# Patient Record
Sex: Female | Born: 1975
Health system: Southern US, Community
[De-identification: ages and names within clinical notes are randomized; demographics above are authoritative.]

## PROBLEM LIST (undated history)

## (undated) DIAGNOSIS — G43909 Migraine, unspecified, not intractable, without status migrainosus: Secondary | ICD-10-CM

## (undated) DIAGNOSIS — R519 Headache, unspecified: Secondary | ICD-10-CM

## (undated) DIAGNOSIS — R011 Cardiac murmur, unspecified: Secondary | ICD-10-CM

## (undated) DIAGNOSIS — Z87440 Personal history of urinary (tract) infections: Secondary | ICD-10-CM

## (undated) DIAGNOSIS — R32 Unspecified urinary incontinence: Secondary | ICD-10-CM

## (undated) DIAGNOSIS — Z923 Personal history of irradiation: Secondary | ICD-10-CM

## (undated) DIAGNOSIS — T7840XA Allergy, unspecified, initial encounter: Secondary | ICD-10-CM

## (undated) DIAGNOSIS — R51 Headache: Secondary | ICD-10-CM

## (undated) DIAGNOSIS — F329 Major depressive disorder, single episode, unspecified: Secondary | ICD-10-CM

## (undated) DIAGNOSIS — Z8051 Family history of malignant neoplasm of kidney: Secondary | ICD-10-CM

## (undated) DIAGNOSIS — F32A Depression, unspecified: Secondary | ICD-10-CM

## (undated) DIAGNOSIS — Z800 Family history of malignant neoplasm of digestive organs: Secondary | ICD-10-CM

## (undated) DIAGNOSIS — Z803 Family history of malignant neoplasm of breast: Secondary | ICD-10-CM

## (undated) DIAGNOSIS — I10 Essential (primary) hypertension: Secondary | ICD-10-CM

## (undated) HISTORY — DX: Family history of malignant neoplasm of digestive organs: Z80.0

## (undated) HISTORY — DX: Headache, unspecified: R51.9

## (undated) HISTORY — DX: Allergy, unspecified, initial encounter: T78.40XA

## (undated) HISTORY — DX: Depression, unspecified: F32.A

## (undated) HISTORY — PX: DILATION AND CURETTAGE OF UTERUS: SHX78

## (undated) HISTORY — DX: Family history of malignant neoplasm of kidney: Z80.51

## (undated) HISTORY — DX: Cardiac murmur, unspecified: R01.1

## (undated) HISTORY — DX: Unspecified urinary incontinence: R32

## (undated) HISTORY — DX: Personal history of urinary (tract) infections: Z87.440

## (undated) HISTORY — DX: Headache: R51

## (undated) HISTORY — DX: Family history of malignant neoplasm of breast: Z80.3

## (undated) HISTORY — DX: Major depressive disorder, single episode, unspecified: F32.9

## (undated) HISTORY — DX: Migraine, unspecified, not intractable, without status migrainosus: G43.909

## (undated) HISTORY — DX: Essential (primary) hypertension: I10

---

## 1999-06-10 ENCOUNTER — Other Ambulatory Visit: Admission: RE | Admit: 1999-06-10 | Discharge: 1999-06-10 | Payer: Self-pay | Admitting: Obstetrics & Gynecology

## 1999-08-19 ENCOUNTER — Ambulatory Visit (HOSPITAL_COMMUNITY): Admission: RE | Admit: 1999-08-19 | Discharge: 1999-08-19 | Payer: Self-pay | Admitting: Obstetrics & Gynecology

## 1999-08-19 ENCOUNTER — Encounter: Payer: Self-pay | Admitting: Obstetrics & Gynecology

## 2000-11-27 ENCOUNTER — Inpatient Hospital Stay (HOSPITAL_COMMUNITY): Admission: AD | Admit: 2000-11-27 | Discharge: 2000-11-27 | Payer: Self-pay | Admitting: *Deleted

## 2001-09-05 ENCOUNTER — Other Ambulatory Visit: Admission: RE | Admit: 2001-09-05 | Discharge: 2001-09-05 | Payer: Self-pay | Admitting: Obstetrics & Gynecology

## 2002-02-16 ENCOUNTER — Emergency Department (HOSPITAL_COMMUNITY): Admission: EM | Admit: 2002-02-16 | Discharge: 2002-02-16 | Payer: Self-pay | Admitting: Emergency Medicine

## 2005-02-19 ENCOUNTER — Inpatient Hospital Stay (HOSPITAL_COMMUNITY): Admission: AD | Admit: 2005-02-19 | Discharge: 2005-02-19 | Payer: Self-pay | Admitting: Obstetrics & Gynecology

## 2005-04-11 ENCOUNTER — Inpatient Hospital Stay (HOSPITAL_COMMUNITY): Admission: AD | Admit: 2005-04-11 | Discharge: 2005-04-11 | Payer: Self-pay | Admitting: Obstetrics and Gynecology

## 2005-04-12 ENCOUNTER — Inpatient Hospital Stay (HOSPITAL_COMMUNITY): Admission: AD | Admit: 2005-04-12 | Discharge: 2005-04-14 | Payer: Self-pay | Admitting: Obstetrics and Gynecology

## 2009-04-15 ENCOUNTER — Emergency Department (HOSPITAL_COMMUNITY): Admission: EM | Admit: 2009-04-15 | Discharge: 2009-04-15 | Payer: Self-pay | Admitting: Emergency Medicine

## 2009-04-15 IMAGING — CT CT HEAD W/O CM
1 series · 16 of 30 positions shown, 20 images · non-contrast
Comparison: None

CLINICAL DATA: Dizziness.

CT HEAD WITHOUT CONTRAST
TECHNIQUE: Contiguous axial images were obtained from the base of
the skull through the vertex without contrast.

[Series 2: head_seq 4.5 h37s st · axial · 0.43mm/px · z∈[+1071,+1197]mm · 16 of 32 slices shown, 20 images]
[im 2/32  brain]
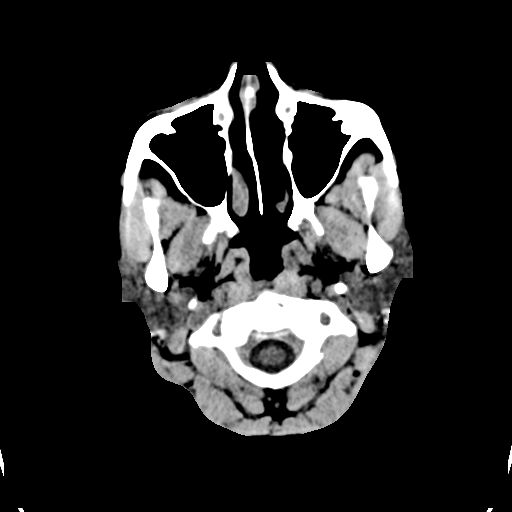
[im 2/32  bone]
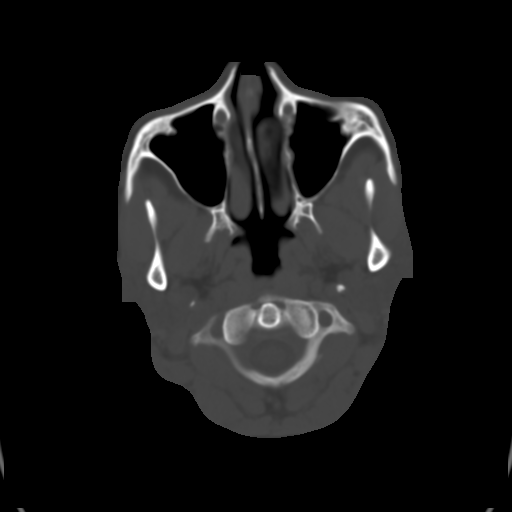
[im 4/32  brain]
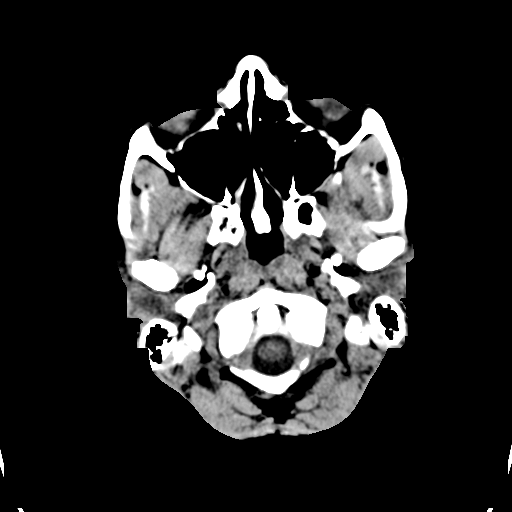
[im 6/32  brain]
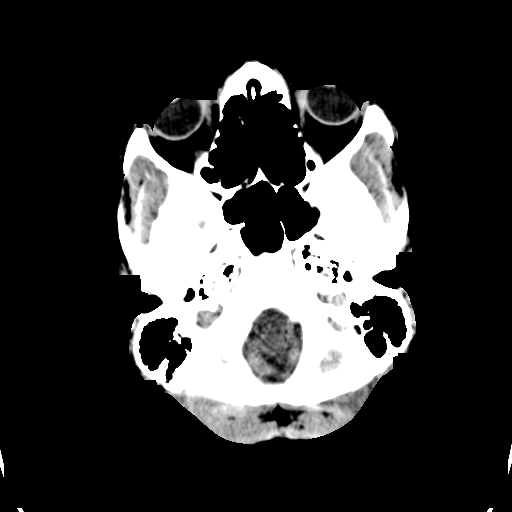
[im 8/32  brain]
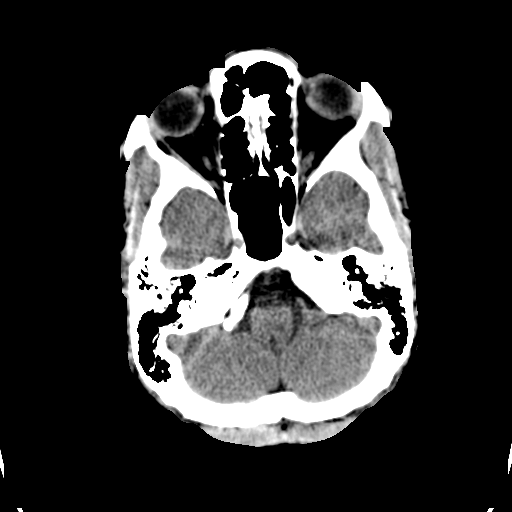
[im 9/32  brain]
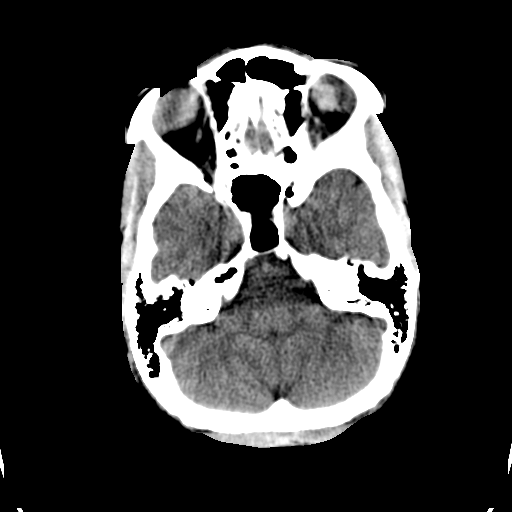
[im 9/32  bone]
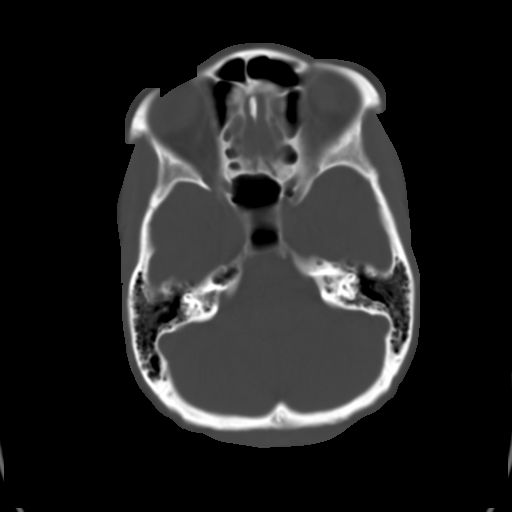
[im 11/32  brain]
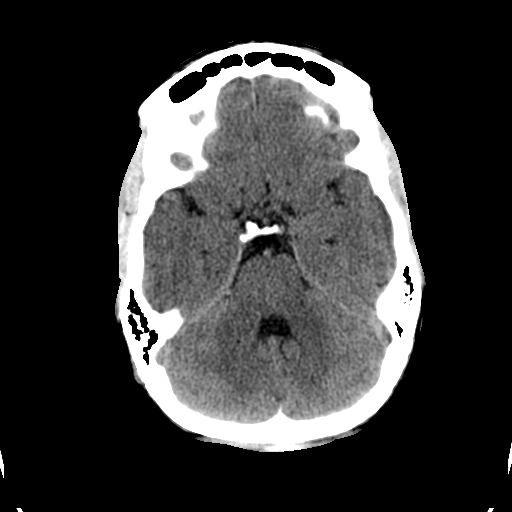
[im 13/32  brain]
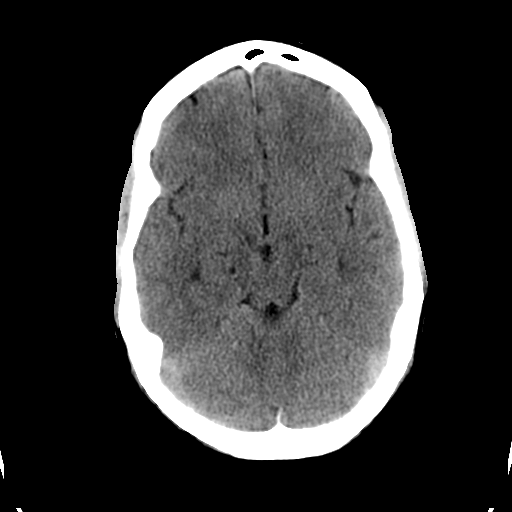
[im 15/32  brain]
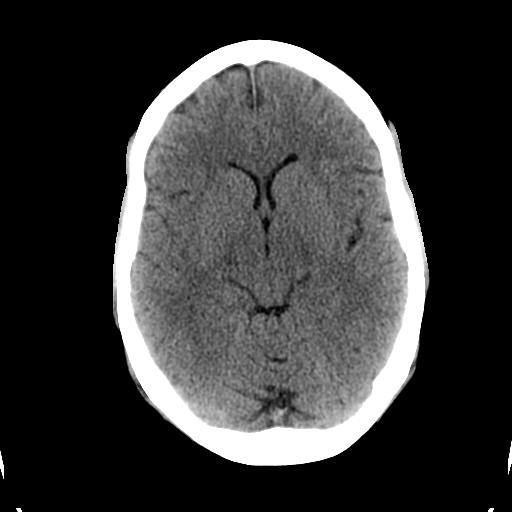
[im 17/32  brain]
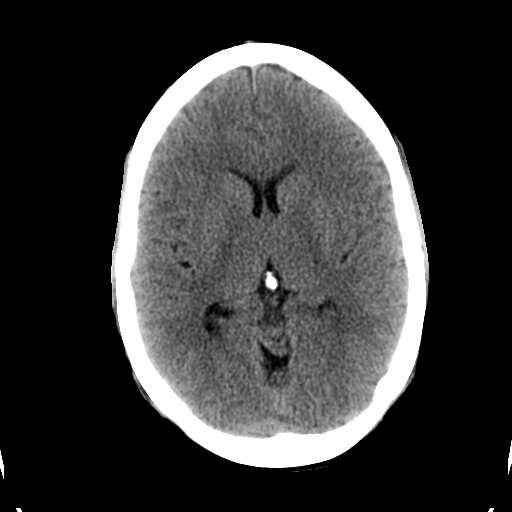
[im 17/32  bone]
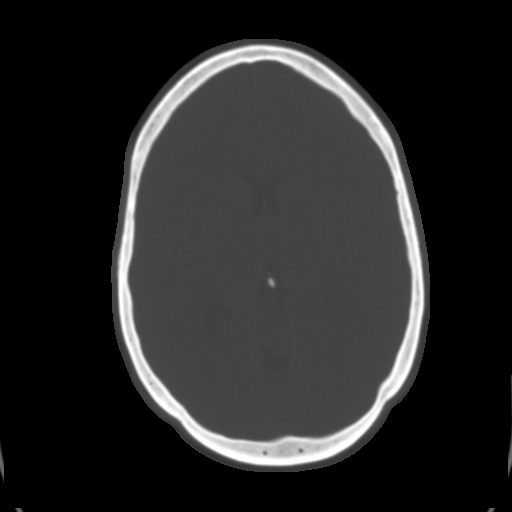
[im 19/32  brain]
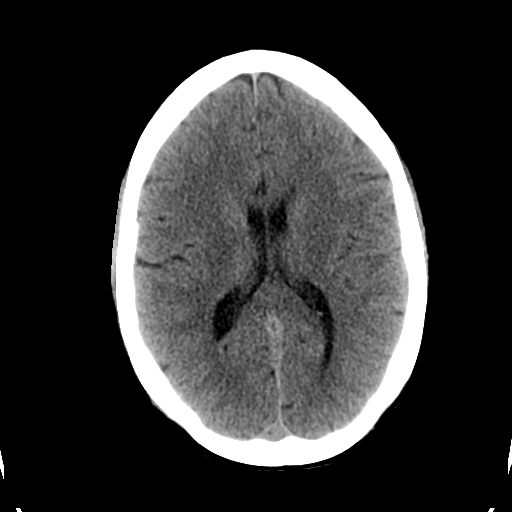
[im 21/32  brain]
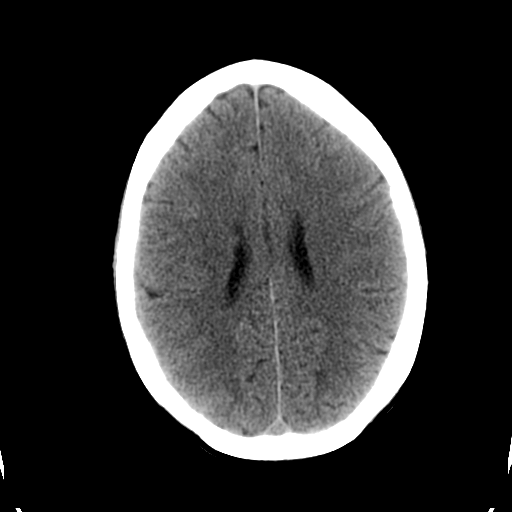
[im 23/32  brain]
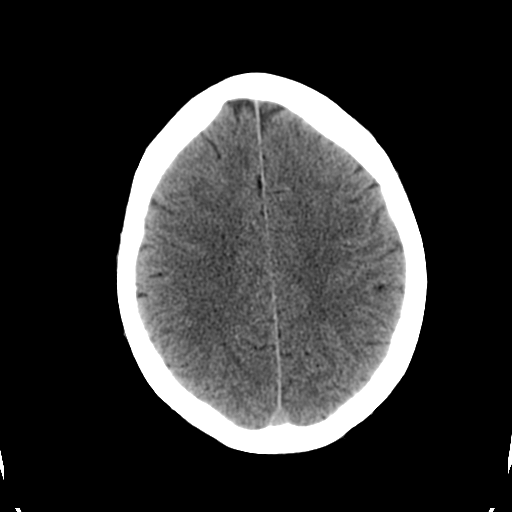
[im 24/32  brain]
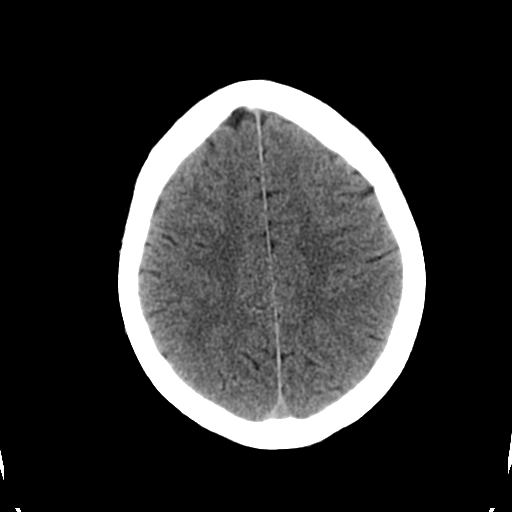
[im 24/32  bone]
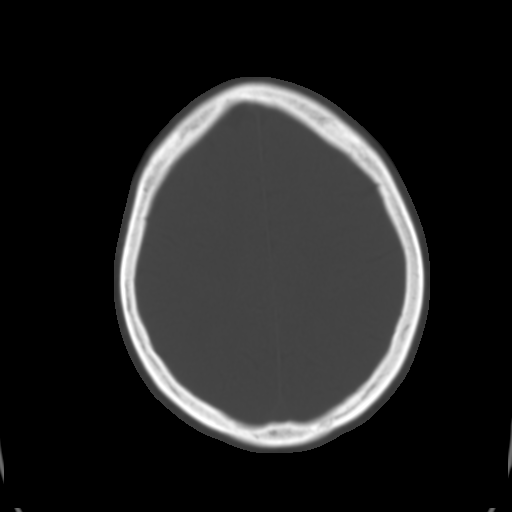
[im 26/32  brain]
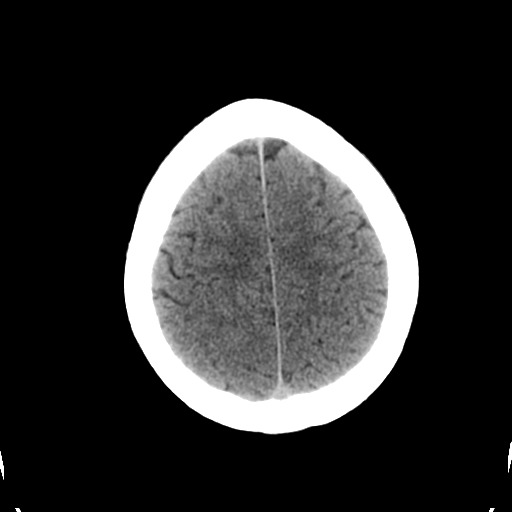
[im 28/32  brain]
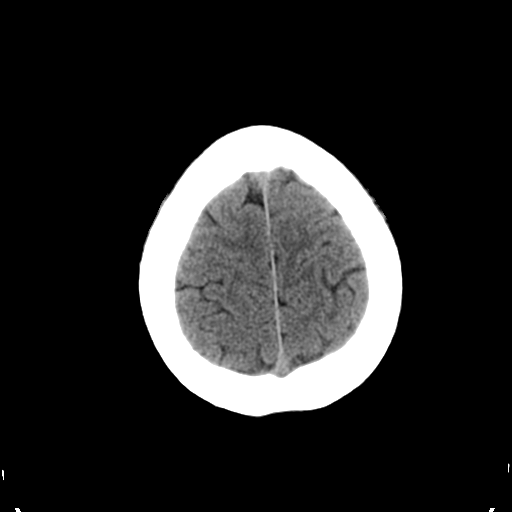
[im 30/32  brain]
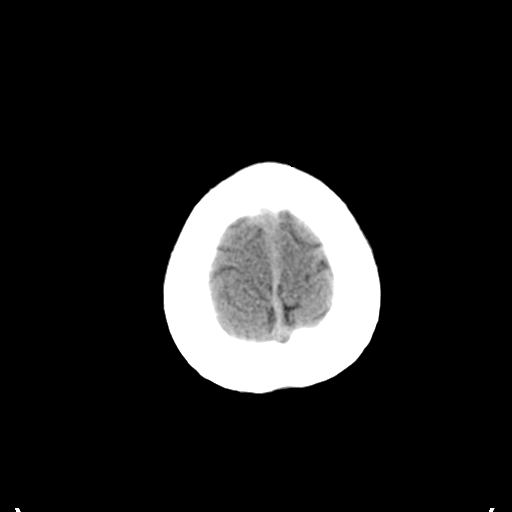

[16 of 30 positions shown; findings below may reference images not displayed]

FINDINGS: There is no acute intracranial hemorrhage, acute
infarction, or intracranial mass lesion.  The brain parenchyma is
normal.  Ventricles are normal.  Bony structures are normal.
IMPRESSION: Normal exam.

## 2010-01-23 ENCOUNTER — Ambulatory Visit (HOSPITAL_COMMUNITY): Admission: RE | Admit: 2010-01-23 | Discharge: 2010-01-23 | Payer: Self-pay | Admitting: Maternal and Fetal Medicine

## 2010-01-23 IMAGING — US US OB TRANSVAGINAL
1 series · 18 of 20 positions shown · non-contrast
Comparison: none

OBSTETRICAL ULTRASOUND:
 This ultrasound was performed in The [HOSPITAL], and the [facility] report will be stored to [REDACTED] PACS.  This report is also available in [HOSPITAL]?s accessANYware.

[Series 1: us ob transvaginal · 20 acquisitions, 18 frames shown]
[im 1/20]
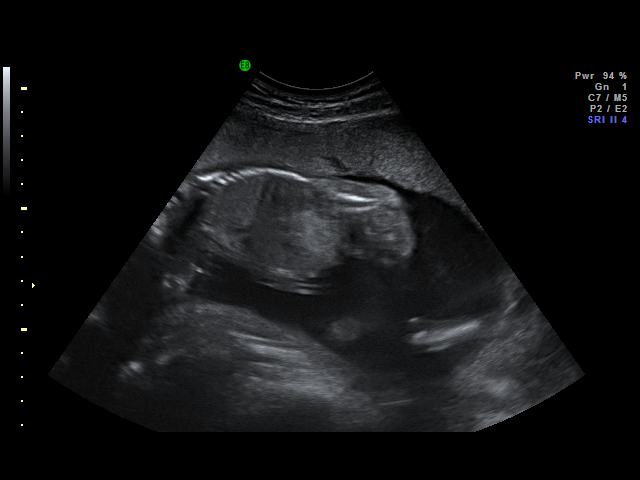
[im 2/20]
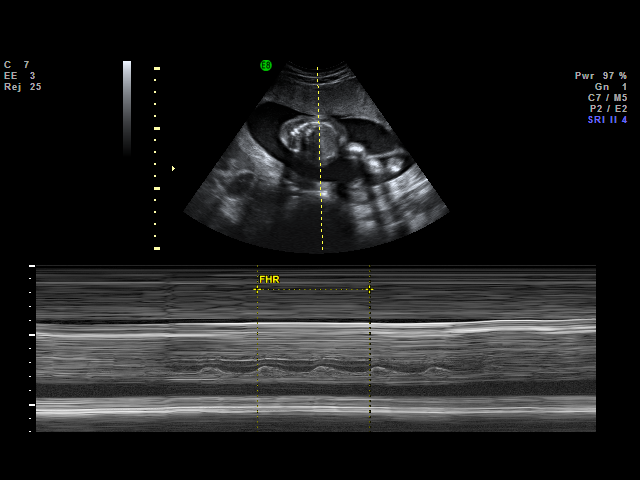
[im 3/20]
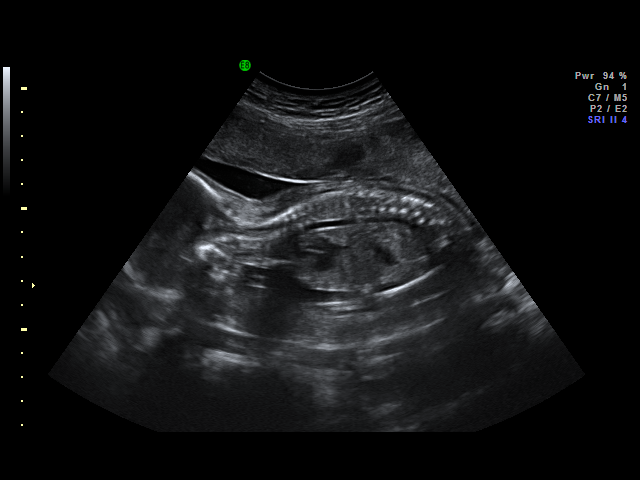
[im 4/20]
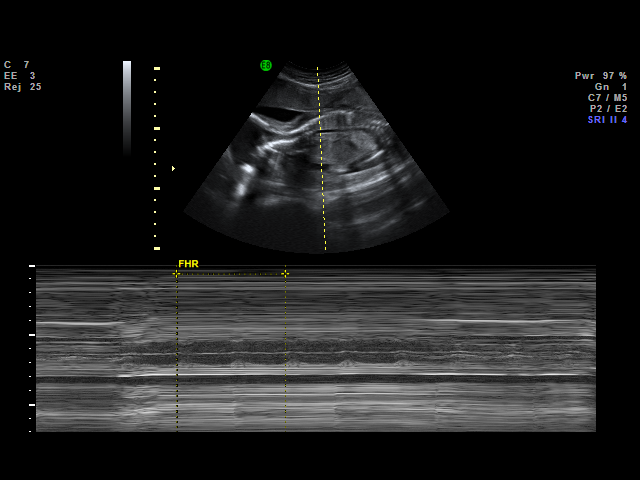
[im 6/20]
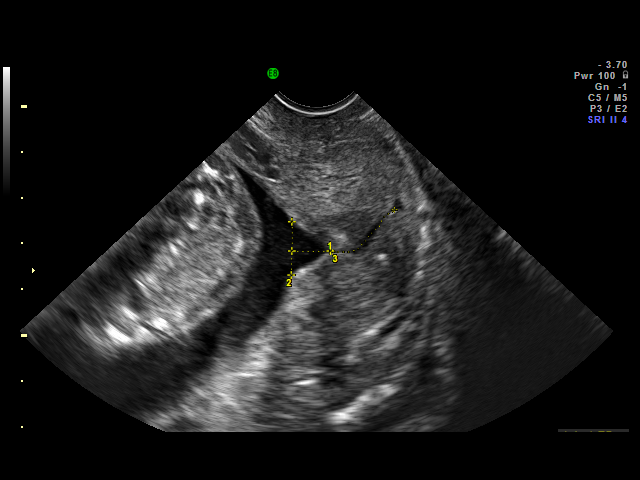
[im 7/20]
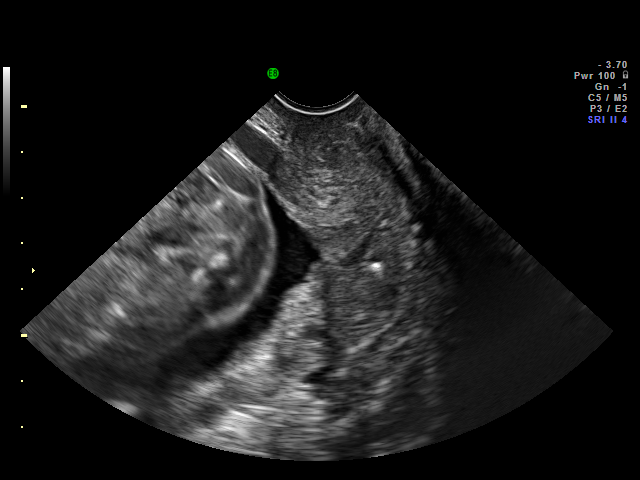
[im 8/20]
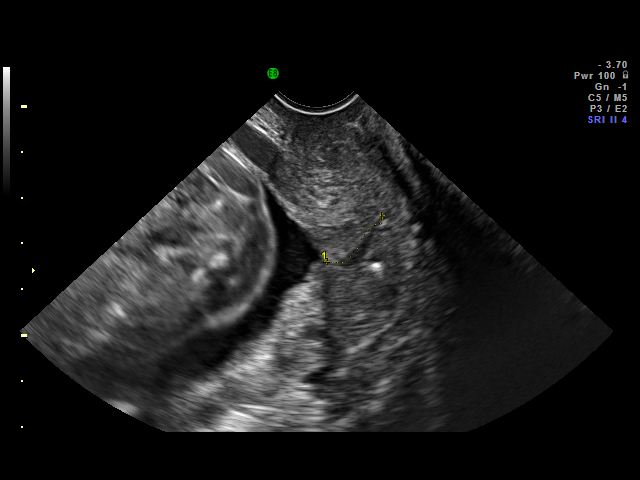
[im 9/20]
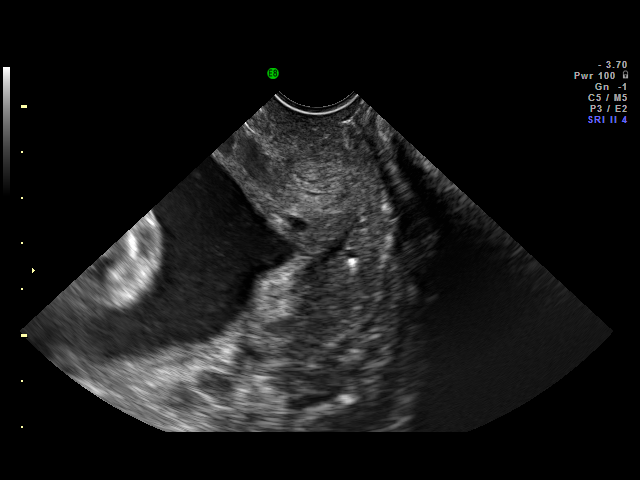
[im 10/20]
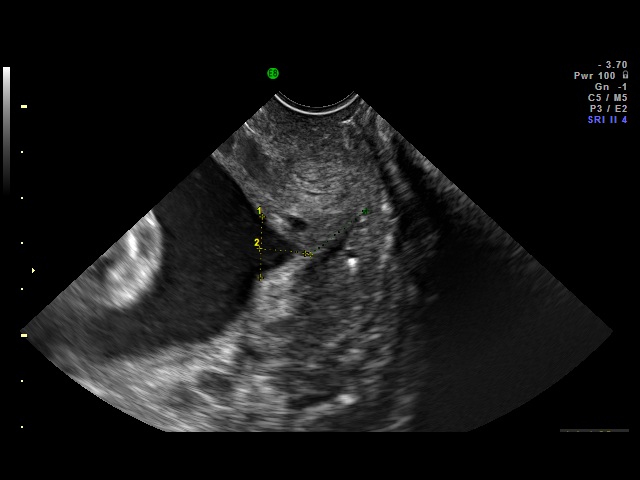
[im 11/20]
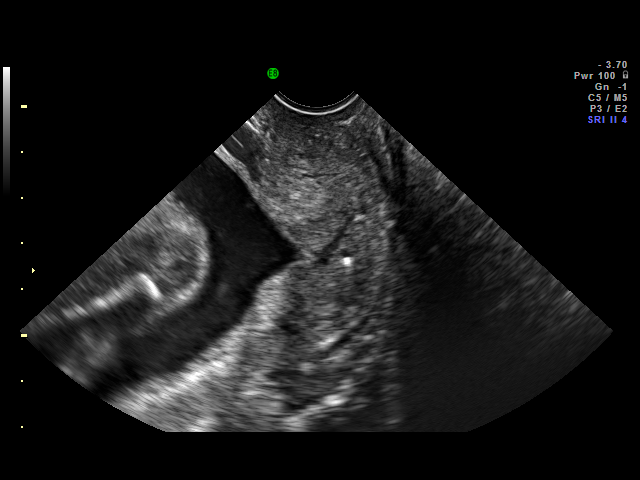
[im 12/20]
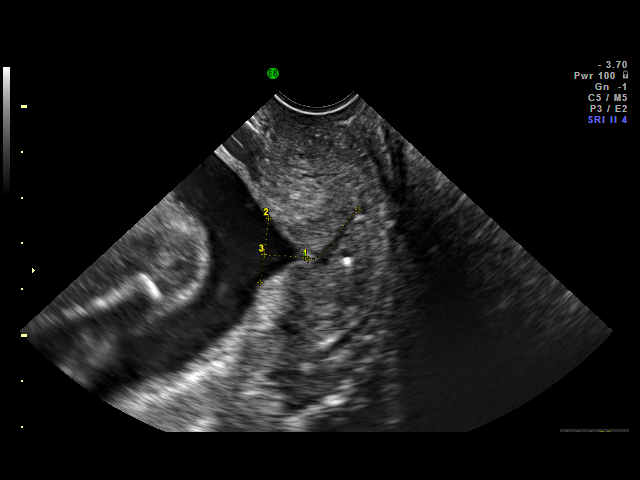
[im 13/20]
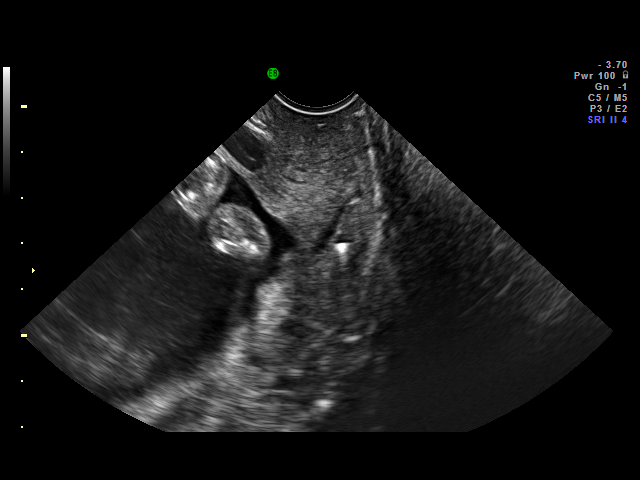
[im 14/20]
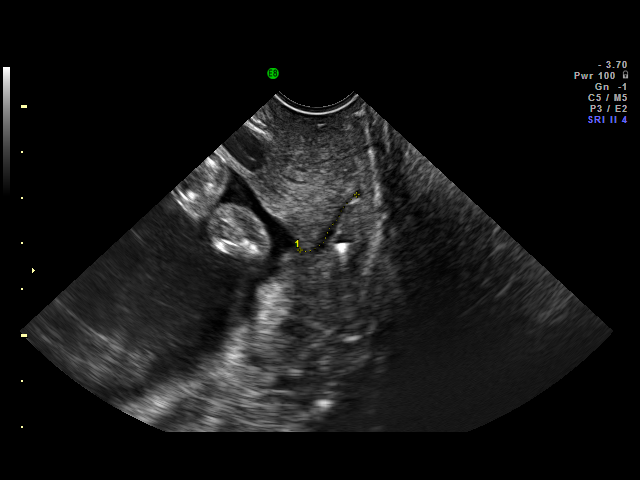
[im 16/20]
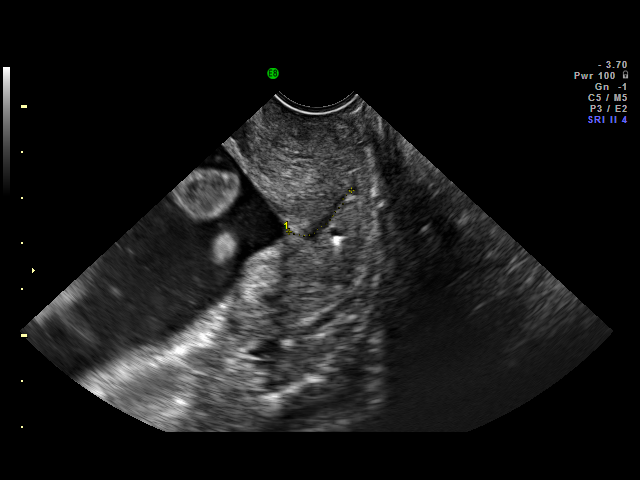
[im 17/20]
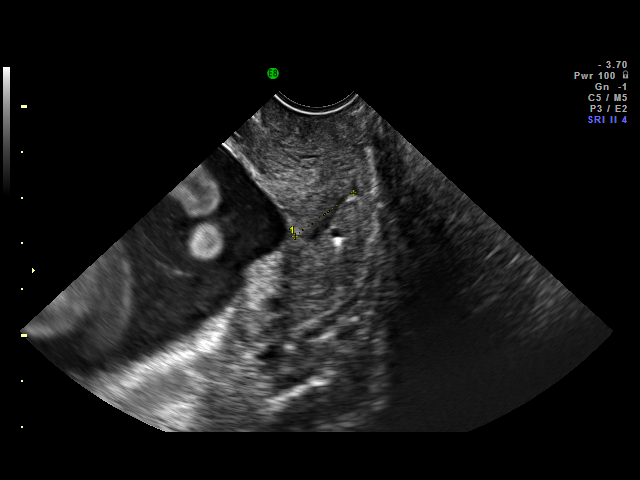
[im 18/20]
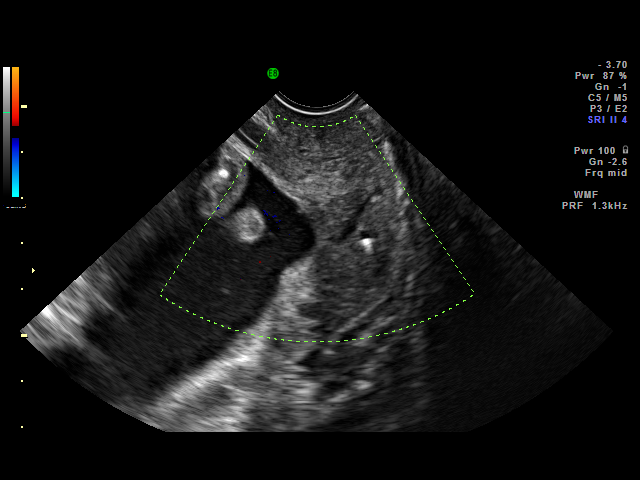
[im 19/20]
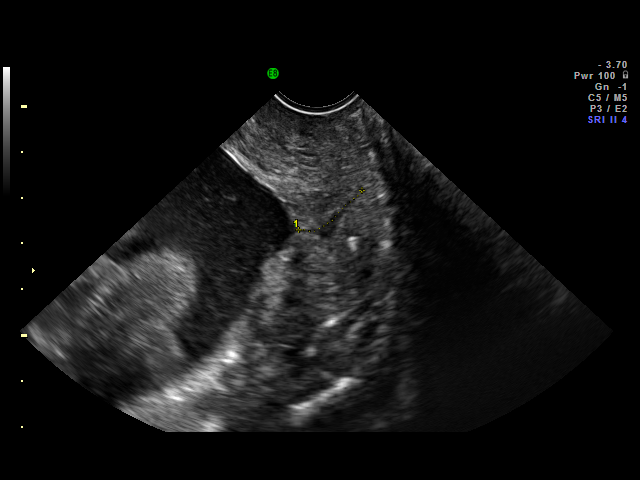
[im 20/20]
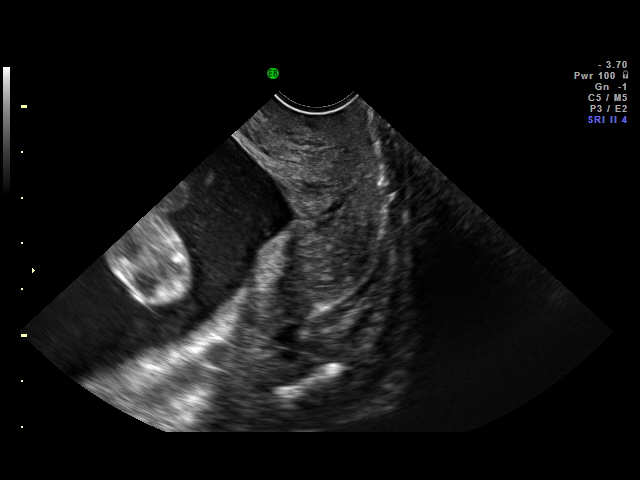

[18 of 20 positions shown; findings below may reference images not displayed]

IMPRESSION: [facility] has also been faxed to the ordering physician.

## 2010-01-27 ENCOUNTER — Ambulatory Visit (HOSPITAL_COMMUNITY): Admission: AD | Admit: 2010-01-27 | Discharge: 2010-01-27 | Payer: Self-pay | Admitting: Obstetrics and Gynecology

## 2010-06-06 ENCOUNTER — Encounter (INDEPENDENT_AMBULATORY_CARE_PROVIDER_SITE_OTHER): Payer: Self-pay | Admitting: Obstetrics & Gynecology

## 2010-06-06 ENCOUNTER — Inpatient Hospital Stay (HOSPITAL_COMMUNITY)
Admission: RE | Admit: 2010-06-06 | Discharge: 2010-06-08 | Payer: Self-pay | Source: Home / Self Care | Admitting: Obstetrics & Gynecology

## 2011-01-24 LAB — CBC
HCT: 30.3 % — ABNORMAL LOW (ref 36.0–46.0)
HCT: 34.4 % — ABNORMAL LOW (ref 36.0–46.0)
Hemoglobin: 10.2 g/dL — ABNORMAL LOW (ref 12.0–15.0)
Hemoglobin: 11.6 g/dL — ABNORMAL LOW (ref 12.0–15.0)
MCH: 30 pg (ref 26.0–34.0)
MCHC: 33.6 g/dL (ref 30.0–36.0)
MCV: 89.4 fL (ref 78.0–100.0)
Platelets: 149 10*3/uL — ABNORMAL LOW (ref 150–400)
RBC: 3.41 MIL/uL — ABNORMAL LOW (ref 3.87–5.11)
RBC: 3.85 MIL/uL — ABNORMAL LOW (ref 3.87–5.11)
RDW: 14.7 % (ref 11.5–15.5)
WBC: 13.6 10*3/uL — ABNORMAL HIGH (ref 4.0–10.5)
WBC: 7.9 10*3/uL (ref 4.0–10.5)

## 2011-01-24 LAB — RPR: RPR Ser Ql: NONREACTIVE

## 2011-01-24 LAB — GLUCOSE, CAPILLARY: Glucose-Capillary: 89 mg/dL (ref 70–99)

## 2011-02-01 LAB — CBC
HCT: 30.9 % — ABNORMAL LOW (ref 36.0–46.0)
Hemoglobin: 10.1 g/dL — ABNORMAL LOW (ref 12.0–15.0)
MCHC: 32.8 g/dL (ref 30.0–36.0)
MCV: 85 fL (ref 78.0–100.0)
RBC: 3.63 MIL/uL — ABNORMAL LOW (ref 3.87–5.11)

## 2011-02-16 LAB — GLUCOSE, CAPILLARY: Glucose-Capillary: 112 mg/dL — ABNORMAL HIGH (ref 70–99)

## 2011-03-27 NOTE — H&P (Signed)
Victoria Maddox, Victoria Maddox               ACCOUNT NO.:  [id number]   MEDICAL RECORD NO.:  [id number]          PATIENT TYPE:  INP   LOCATION:  9115                          FACILITY:  [facility]   PHYSICIAN:  Lenoard Aden, M.D.DATE OF BIRTH:  08/30/1971   DATE OF ADMISSION:  04/12/2005  DATE OF DISCHARGE:                                HISTORY & PHYSICAL   CHIEF COMPLAINT:  Labor.   HISTORY OF PRESENT ILLNESS:  The patient is a 35 year old African-American  female G2, P0, EDC of April 14, 2005 at 40-2/7 weeks in active labor.   PAST MEDICAL HISTORY:  She has a history of fibroids, history of IBF,  history of missed AB with SAB. History of anemia. History of migraine  headaches.   FAMILY HISTORY:  Heart disease, hypertension and throat cancer.   PRENATAL LAB DATA:  Blood type B positive, rubella immune, hepatitis  negative, HIV declined. Otherwise uncomplicated pregnancy.   PHYSICAL EXAMINATION:  GENERAL:  She is a well-developed, well-nourished  African-American female in no acute distress.  HEENT:  Normal.  LUNGS:  Clear.  HEART:  Regular rhythm.  ABDOMEN:  Soft, gravid and nontender.  PELVIC:  Cervix is 6 cm, 100% and vertex +1.  EXTREMITIES:  Reveal no cords.  NEUROLOGIC EXAM:  Nonfocal.   IMPRESSION:  Term intrauterine pregnancy in active labor.   PLAN:  Anticipate attempts at vaginal delivery. Risks, benefits discussed.      RJT/MEDQ  D:  04/12/2005  T:  04/12/2005  Job:  [id number]

## 2011-03-27 NOTE — H&P (Signed)
NAMEBRIELL, Victoria NO.:  [id number]   MEDICAL RECORD NO.:  [id number]          PATIENT TYPE:  MAT   LOCATION:  [facility]C                          FACILITY:  [facility]   PHYSICIAN:  Lenoard Aden, M.D.DATE OF BIRTH:  08/30/1971   DATE OF ADMISSION:  04/11/2005  DATE OF DISCHARGE:                                HISTORY & PHYSICAL   CHIEF COMPLAINT:  Rule out rupture of membranes.   HISTORY OF PRESENT ILLNESS:  The patient is a 35 year old African American  female, G2, P0, at 40-1/7th weeks' gestation with questionable leakage of  fluid this evening.   She has no known drug allergies.   MEDICATIONS:  Prenatal vitamins and Zantac.   She is a nonsmoker, nondrinker.  She denies domestic or physical violence.   She has a history of IVF induced conception and a history of SAB with D&C in  2004.   FAMILY HISTORY:  Hypertension, heart disease, throat cancer.   She has a personal history of migraine headaches.   Pregnancy course, otherwise uncomplicated.   PHYSICAL EXAMINATION:  GENERAL:  She is a well-developed, well-nourished,  African American female in no acute distress.  HEENT:  Normal.  LUNGS:  Clear.  HEART:  Regular rhythm.  ABDOMEN:  Soft, gravid, nontender.  PELVIC:  Cervix is 3-cm dilated, 80%, vertex, and 0 to +1 station.  Membranes are palpable.  Negative fern.  Negative Nitrazine.  Negative  pooling is noted.  EXTREMITIES:  Reveal no cords.  NEUROLOGIC:  Nonfocal.   IMPRESSION:  1.  A 40-week intrauterine pregnancy.  2.  No evidence of ruptured membranes.   PLAN:  1.  Discharge home.  2.  Strict labor warnings given.  3.  If any further leakage of fluid is noted, the patient is to return for      evaluation.      RJT/MEDQ  D:  04/11/2005  T:  04/12/2005  Job:  [id number]   cc:   [facility]

## 2018-01-20 ENCOUNTER — Encounter: Payer: Self-pay | Admitting: Emergency Medicine

## 2018-01-20 ENCOUNTER — Ambulatory Visit (INDEPENDENT_AMBULATORY_CARE_PROVIDER_SITE_OTHER): Payer: Self-pay | Admitting: Emergency Medicine

## 2018-01-20 VITALS — BP 110/62 | HR 81 | Temp 98.3°F | Wt 123.2 lb

## 2018-01-20 DIAGNOSIS — Z760 Encounter for issue of repeat prescription: Secondary | ICD-10-CM

## 2018-01-20 MED ORDER — VERAPAMIL HCL 80 MG PO TABS: 80.0000 mg | ORAL_TABLET | Freq: Three times a day (TID) | ORAL | 4 refills | Status: DC

## 2018-01-20 MED ORDER — HYDROCHLOROTHIAZIDE 25 MG PO TABS: 25.0000 mg | ORAL_TABLET | Freq: Every day | ORAL | 4 refills | Status: DC

## 2018-01-20 MED FILL — HYDROCHLOROTHIAZIDE 25 MG T: 25 | 30 days supply | Qty: 30 | Fill #0

## 2018-01-20 MED FILL — VERAPAMIL 80 MG TABLET: 80 | 30 days supply | Qty: 90 | Fill #0

## 2018-01-20 NOTE — Patient Instructions (Signed)

## 2018-01-20 NOTE — Progress Notes (Signed)
S: Victoria Maddox is a 42 y.o. female who presents with a chief complaint of needing medication refill. She is new to the [place] area, has an appointment to establish with PCP in June. She has been stable on these medications without complication for many years. She has no acute complaints today.   Review of Systems  Constitutional: Negative.   HENT: Negative.   Respiratory: Negative.   Cardiovascular: Negative.   Gastrointestinal: Negative.   Genitourinary: Negative.   Musculoskeletal: Negative.   Skin: Negative.   Neurological: Negative.    O: Vitals:   01/20/18 1148  BP: 110/62  Pulse: 81  Temp: 98.3 F (36.8 C)  SpO2: 99%   Physical Exam  Constitutional: She appears well-developed and well-nourished. No distress.  HENT:  Head: Normocephalic and atraumatic.  Right Ear: External ear normal.  Left Ear: External ear normal.  Eyes: Conjunctivae are normal. Pupils are equal, round, and reactive to light.  Fundoscopic exam:      The right eye shows no papilledema.       The left eye shows no papilledema.  Neck: Normal range of motion.  Cardiovascular: Normal rate and regular rhythm.  Pulmonary/Chest: Effort normal and breath sounds normal.  Musculoskeletal: She exhibits no edema.  Neurological: She is alert. She displays normal reflexes. No cranial nerve deficit.  Skin: Skin is warm and dry. Capillary refill takes less than 2 seconds. She is not diaphoretic.  Nursing note and vitals reviewed.   A: 1. Medication refill     P: Patient has appointment to establish with new PCP in June.Encouraged her to keep this appointment. Provided refills to cover, return as needed, ER for chest pain, shortness of breath, paresthesias, or other concerning symptoms.

## 2018-03-14 MED FILL — HYDROCHLOROTHIAZIDE 25 MG T: 25 | 30 days supply | Qty: 30 | Fill #1

## 2018-05-03 ENCOUNTER — Ambulatory Visit: Payer: Self-pay | Admitting: Family Medicine

## 2018-05-04 ENCOUNTER — Encounter

## 2018-05-04 ENCOUNTER — Ambulatory Visit (INDEPENDENT_AMBULATORY_CARE_PROVIDER_SITE_OTHER): Payer: No Typology Code available for payment source | Admitting: Family Medicine

## 2018-05-04 ENCOUNTER — Encounter: Payer: Self-pay | Admitting: Family Medicine

## 2018-05-04 VITALS — BP 122/78 | HR 81 | Temp 98.9°F | Ht 63.75 in | Wt 122.0 lb

## 2018-05-04 DIAGNOSIS — Z1231 Encounter for screening mammogram for malignant neoplasm of breast: Secondary | ICD-10-CM | POA: Diagnosis not present

## 2018-05-04 DIAGNOSIS — F4323 Adjustment disorder with mixed anxiety and depressed mood: Secondary | ICD-10-CM | POA: Insufficient documentation

## 2018-05-04 DIAGNOSIS — G4700 Insomnia, unspecified: Secondary | ICD-10-CM | POA: Insufficient documentation

## 2018-05-04 DIAGNOSIS — I10 Essential (primary) hypertension: Secondary | ICD-10-CM | POA: Diagnosis not present

## 2018-05-04 DIAGNOSIS — F5102 Adjustment insomnia: Secondary | ICD-10-CM | POA: Diagnosis not present

## 2018-05-04 DIAGNOSIS — Z1239 Encounter for other screening for malignant neoplasm of breast: Secondary | ICD-10-CM

## 2018-05-04 LAB — COMPREHENSIVE METABOLIC PANEL
ALK PHOS: 54 U/L (ref 39–117)
ALT: 15 U/L (ref 0–35)
AST: 14 U/L (ref 0–37)
Albumin: 4.1 g/dL (ref 3.5–5.2)
BILIRUBIN TOTAL: 0.3 mg/dL (ref 0.2–1.2)
BUN: 13 mg/dL (ref 6–23)
CHLORIDE: 102 meq/L (ref 96–112)
CO2: 28 mEq/L (ref 19–32)
Calcium: 9.5 mg/dL (ref 8.4–10.5)
Creatinine, Ser: 0.65 mg/dL (ref 0.40–1.20)
GFR: 128.72 mL/min (ref >60.00)
Glucose, Bld: 110 mg/dL — ABNORMAL HIGH (ref 70–99)
POTASSIUM: 3.7 meq/L (ref 3.5–5.1)
SODIUM: 136 meq/L (ref 135–145)
TOTAL PROTEIN: 7.3 g/dL (ref 6.0–8.3)

## 2018-05-04 LAB — TSH: TSH: 2.57 u[IU]/mL (ref 0.35–4.50)

## 2018-05-04 LAB — LIPID PANEL
CHOL/HDL RATIO: 2
Cholesterol: 180 mg/dL (ref 0–200)
HDL: 82.7 mg/dL (ref >39.00)
LDL Cholesterol: 83 mg/dL (ref 0–99)
NonHDL: 96.81
TRIGLYCERIDES: 68 mg/dL (ref 0.0–149.0)
VLDL: 13.6 mg/dL (ref 0.0–40.0)

## 2018-05-04 LAB — CBC WITH DIFFERENTIAL/PLATELET
Basophils Absolute: 0 10*3/uL (ref 0.0–0.1)
Basophils Relative: 0.8 % (ref 0.0–3.0)
EOS PCT: 3.4 % (ref 0.0–5.0)
Eosinophils Absolute: 0.2 10*3/uL (ref 0.0–0.7)
HCT: 34.8 % — ABNORMAL LOW (ref 36.0–46.0)
Hemoglobin: 11.5 g/dL — ABNORMAL LOW (ref 12.0–15.0)
Lymphocytes Relative: 42.1 % (ref 12.0–46.0)
Lymphs Abs: 2.1 10*3/uL (ref 0.7–4.0)
MCHC: 33.1 g/dL (ref 30.0–36.0)
MCV: 81.7 fl (ref 78.0–100.0)
MONO ABS: 0.6 10*3/uL (ref 0.1–1.0)
MONOS PCT: 12.3 % — AB (ref 3.0–12.0)
NEUTROS ABS: 2.1 10*3/uL (ref 1.4–7.7)
NEUTROS PCT: 41.4 % — AB (ref 43.0–77.0)
Platelets: 206 10*3/uL (ref 150.0–400.0)
RBC: 4.26 Mil/uL (ref 3.87–5.11)
RDW: 14.4 % (ref 11.5–15.5)
WBC: 5.1 10*3/uL (ref 4.0–10.5)

## 2018-05-04 MED ORDER — SERTRALINE HCL 50 MG PO TABS: 50.0000 mg | ORAL_TABLET | Freq: Every day | ORAL | 3 refills | Status: DC

## 2018-05-04 MED ORDER — VERAPAMIL HCL 80 MG PO TABS: 80.0000 mg | ORAL_TABLET | Freq: Three times a day (TID) | ORAL | 1 refills | Status: DC

## 2018-05-04 MED ORDER — HYDROCHLOROTHIAZIDE 25 MG PO TABS: 25.0000 mg | ORAL_TABLET | Freq: Every day | ORAL | 1 refills | Status: DC

## 2018-05-04 MED ORDER — TRAZODONE HCL 50 MG PO TABS: 25.0000 mg | ORAL_TABLET | Freq: Every evening | ORAL | 3 refills | Status: DC | PRN

## 2018-05-04 MED FILL — SERTRALINE HCL 50 MG TABLET: 50 | 30 days supply | Qty: 30 | Fill #0

## 2018-05-04 MED FILL — traZODone HCL 50 MG TABS: 50 | 30 days supply | Qty: 30 | Fill #0

## 2018-05-04 MED FILL — VERAPAMIL 80 MG TABLET: 80 | 90 days supply | Qty: 270 | Fill #0

## 2018-05-04 MED FILL — HYDROCHLOROTHIAZIDE 25 MG T: 25 | 90 days supply | Qty: 90 | Fill #0

## 2018-05-04 NOTE — Patient Instructions (Addendum)
It was great to meet you. I will call you with your lab results from today and you can view them online.   Please call the breast center at [phone] to schedule your mammogram.  We are starting trazodone 1/2 to 1 tablet by mouth nightly for sleep.  Start Zoloft 50 mg. Patient is to take 1/2 tablet daily for 10 days, then advance to 1 full tablet thereafter.   Please follow up in 1 month.

## 2018-05-04 NOTE — Assessment & Plan Note (Signed)
PHQ 9 is 15 today. She is declining psychotherapy. Start Zoloft 50 mg. Patient is to take 1/2 tablet daily for 10 days, then advance to 1 full tablet thereafter. We discussed possible side effects of headache, GI upset, drowsiness, and SI/HI. If thoughts of SI/HI develop, we discussed to present to the emergency immediately. Patient verbalized understanding.

## 2018-05-04 NOTE — Assessment & Plan Note (Signed)
Well controlled. eRx refills sent. Labs today. Orders Placed This Encounter  Procedures  . MM Digital Screening  . CBC with Differential/Platelet  . Comprehensive metabolic panel  . Lipid panel  . TSH

## 2018-05-04 NOTE — Progress Notes (Signed)
Subjective:   Patient ID: Victoria Maddox, female    DOB: [birth year], 42 y.o.   MRN: [id number]  Victoria Maddox is a pleasant 42 y.o. year old female who presents to clinic today with New Patient (Initial Visit) (Patient is here today to establish care.  She is currently fasting.  She states that she rec her Rd in 2016. She agrees to get a Mammogram at [facility] if a referral is created. ); Depression (I start going through the fall/depression screen and she is hesitant to answer stating that she feels depressed because of her husband not for a real reason.  I provided PSQ-9 for completion); and Hypertension (She went to an urgent care prior to coming here for her initial visit due to running out of her BP meds.  She is currently stable on the HCTZ and Verapamil and would like to continue current regimen.)  on 05/04/2018  HPI:  Pt is new to me- recently moved to the area.  HTN- has been on current rxs for years- HCTZ 25 mg daily and verapamil 80 mg three times daily. Denies HA, blurred vision, CP, SOB or LE edema.  Depression/anxiety- diagnosed with this years ago- husband has moved the family around a lot.  She feels anxious and sad and like she does not have patience with her children.  Denise any SI or HI. Not sleeping well.  Benadryl has not been very effective as a sleep aid.  Took an antidepressant years ago but she cannot remember which one it was.   Current Outpatient Medications on File Prior to Visit  Medication Sig Dispense Refill  . acetaminophen (TYLENOL) 325 MG tablet Take 650 mg by mouth every 6 (six) hours as needed.    . diphenhydrAMINE (BENADRYL) 25 mg capsule Take 25 mg by mouth at bedtime as needed.     No current facility-administered medications on file prior to visit.     No Known Allergies  No past medical history on file.   No family history on file.  Social History   Socioeconomic History  . Marital status: Married    Spouse name: Not on file  .  Number of children: Not on file  . Years of education: Not on file  . Highest education level: Not on file  Occupational History  . Not on file  Social Needs  . Financial resource strain: Not on file  . Food insecurity:    Worry: Not on file    Inability: Not on file  . Transportation needs:    Medical: Not on file    Non-medical: Not on file  Tobacco Use  . Smoking status: Never Smoker  . Smokeless tobacco: Never Used  Substance and Sexual Activity  . Alcohol use: Not Currently  . Drug use: Never  . Sexual activity: Yes    Partners: Male  Lifestyle  . Physical activity:    Days per week: Not on file    Minutes per session: Not on file  . Stress: Not on file  Relationships  . Social connections:    Talks on phone: Not on file    Gets together: Not on file    Attends religious service: Not on file    Active member of club or organization: Not on file    Attends meetings of clubs or organizations: Not on file    Relationship status: Not on file  . Intimate partner violence:    Fear of current or ex partner:  Not on file    Emotionally abused: Not on file    Physically abused: Not on file    Forced sexual activity: Not on file  Other Topics Concern  . Not on file  Social History Narrative  . Not on file   The PMH, PSH, Social History, Family History, Medications, and allergies have been reviewed in [facility], and have been updated if relevant.   Review of Systems  Constitutional: Negative.   HENT: Negative.   Eyes: Negative.   Respiratory: Negative.   Cardiovascular: Negative.   Gastrointestinal: Negative.   Endocrine: Negative.   Genitourinary: Negative.   Musculoskeletal: Negative.   Skin: Negative.   Allergic/Immunologic: Negative.   Neurological: Negative.   Hematological: Negative.   Psychiatric/Behavioral: Positive for dysphoric mood and sleep disturbance. Negative for agitation, behavioral problems, confusion, decreased concentration, hallucinations,  self-injury and suicidal ideas. The patient is nervous/anxious. The patient is not hyperactive.   All other systems reviewed and are negative.      Objective:    BP 122/78 (BP Location: Left Arm, Patient Position: Sitting, Cuff Size: Normal)   Pulse 81   Temp 98.9 F (37.2 C) (Oral)   Ht 5' 3.75" (1.619 m)   Wt 122 lb (55.3 kg)   LMP 04/21/2018 Comment: Last PAP 2014  SpO2 99%   BMI 21.11 kg/m    Physical Exam  Constitutional: She is oriented to person, place, and time. She appears well-developed and well-nourished. No distress.  HENT:  Head: Normocephalic and atraumatic.  Eyes: EOM are normal.  Neck: Normal range of motion.  Cardiovascular: Normal rate and regular rhythm.  Pulmonary/Chest: Effort normal and breath sounds normal.  Musculoskeletal: Normal range of motion. She exhibits no edema.  Neurological: She is alert and oriented to person, place, and time. No cranial nerve deficit.  Skin: Skin is warm and dry. She is not diaphoretic.  Psychiatric: She has a normal mood and affect. Her behavior is normal. Judgment and thought content normal.  Nursing note and vitals reviewed.         Assessment & Plan:   Essential hypertension - Plan: CBC with Differential/Platelet, Comprehensive metabolic panel, Lipid panel, TSH  Screening for breast cancer - Plan: MM Digital Screening No follow-ups on file.

## 2018-05-04 NOTE — Assessment & Plan Note (Signed)
She would like to try a sleep aid in addition to start zoloft. eRx sent for trazodone. Follow up in 1 month. The patient indicates understanding of these issues and agrees with the plan.

## 2018-05-10 ENCOUNTER — Telehealth: Payer: Self-pay

## 2018-05-10 NOTE — Telephone Encounter (Signed)
-----   Message from Lucille Passy, MD sent at 05/09/2018  7:50 AM EDT ----- Please let pt know that her cholesterol, thyroid function, liver function, kidney function and electrolytes look excellent. Keep up the good work.

## 2018-05-10 NOTE — Telephone Encounter (Signed)
PEC-Ok to give results of labs/I LMOVM stating that all of her labs look good and to keep up the good work/thx dmf

## 2018-06-07 ENCOUNTER — Ambulatory Visit: Payer: No Typology Code available for payment source | Admitting: Family Medicine

## 2018-06-07 DIAGNOSIS — Z0289 Encounter for other administrative examinations: Secondary | ICD-10-CM

## 2018-06-16 ENCOUNTER — Inpatient Hospital Stay: Admission: RE | Admit: 2018-06-16 | Payer: No Typology Code available for payment source | Source: Ambulatory Visit

## 2018-08-09 ENCOUNTER — Ambulatory Visit (INDEPENDENT_AMBULATORY_CARE_PROVIDER_SITE_OTHER): Payer: No Typology Code available for payment source | Admitting: Family Medicine

## 2018-08-09 VITALS — BP 120/78 | HR 78 | Temp 98.7°F | Ht 64.0 in | Wt 121.2 lb

## 2018-08-09 DIAGNOSIS — F5102 Adjustment insomnia: Secondary | ICD-10-CM | POA: Diagnosis not present

## 2018-08-09 DIAGNOSIS — F4323 Adjustment disorder with mixed anxiety and depressed mood: Secondary | ICD-10-CM

## 2018-08-09 MED ORDER — CITALOPRAM HYDROBROMIDE 20 MG PO TABS: 20.0000 mg | ORAL_TABLET | Freq: Every day | ORAL | 3 refills | Status: DC

## 2018-08-09 MED ORDER — ZALEPLON 5 MG PO CAPS: 5.0000 mg | ORAL_CAPSULE | Freq: Every evening | ORAL | 1 refills | Status: DC | PRN

## 2018-08-09 MED FILL — CITALOPRAM HBR 20 MG TABLET: 20 | 30 days supply | Qty: 30 | Fill #0

## 2018-08-09 MED FILL — ZALEPLON 5 MG CAPSULE: 5 | 30 days supply | Qty: 30 | Fill #0

## 2018-08-09 NOTE — Progress Notes (Signed)
Subjective:   Patient ID: Victoria Maddox, female    DOB: 1976/10/29, 42 y.o.   MRN: [id number]  Victoria Maddox is a pleasant 42 y.o. year old female who presents to clinic today with Follow-up (Patient is here today for a 56-month-F/U.  She was last seen on 6.26.19 as a new patient.  Labs were completed and pt was started on Zoloft and Trazodone.  She states that she stopped the Trazodone as it caused severe dizziness but not sedating.  Zoloft was tried for 3 weeks but caused her to be more anxious and lash out at her kids so she stopped.  She feels that she does need something for both issues.)  on 08/09/2018  HPI:  Established care with me at her last OV on 05/04/18- note reviewed.  At that time, she reported feeling anxious and sad for several months.  She also was not sleeping well- had failed OTC treatments.  Had a PMH of depression- took an SSRI years ago but could not remember which one.  We therefore started her on zoloft 50 mg daily and trazodone 25-50 mg nightly as needed for insomnia.  She unfortunately did not react well to either the trazodone or zoloft.  Trazodone caused dizziness but did not help her to fall asleep so she stopped it right away. She took zoloft for 3 weeks and stopped it because it actually made her more anxious and lash out at her kids even more.  Current Outpatient Medications on File Prior to Visit  Medication Sig Dispense Refill  . acetaminophen (TYLENOL) 325 MG tablet Take 650 mg by mouth every 6 (six) hours as needed.    . diphenhydrAMINE (BENADRYL) 25 mg capsule Take 25 mg by mouth at bedtime as needed.    . hydrochlorothiazide (HYDRODIURIL) 25 MG tablet Take 1 tablet (25 mg total) by mouth daily. 90 tablet 1  . verapamil (CALAN) 80 MG tablet Take 1 tablet (80 mg total) by mouth 3 (three) times daily. (Patient taking differently: Take 80 mg by mouth once. ) 270 tablet 1   No current facility-administered medications on file prior to visit.      Allergies  Allergen Reactions  . Trazodone And Nefazodone     Dizziness  . Zoloft [Sertraline Hcl]     Anxiousness, lash out at kids    Past Medical History:  Diagnosis Date  . Allergy   . Depression   . Frequent headaches   . Heart murmur   . History of recurrent UTIs   . Hypertension   . Migraines   . Urinary incontinence       Family History  Problem Relation Age of Onset  . Heart attack Mother   . Heart disease Mother   . Hypertension Mother   . Early death Father   . Hypertension Father   . Hypertension Sister   . Diabetes Brother   . Hypertension Brother   . Asthma Sister   . Hypertension Sister   . Hyperlipidemia Brother   . Hypertension Brother   . Mental illness Brother   . Heart disease Sister   . Hypertension Sister   . Arthritis Brother   . Diabetes Brother   . Hypertension Brother     Social History   Socioeconomic History  . Marital status: Married    Spouse name: Not on file  . Number of children: Not on file  . Years of education: Not on file  . Highest education level: Not on  file  Occupational History  . Not on file  Social Needs  . Financial resource strain: Not on file  . Food insecurity:    Worry: Not on file    Inability: Not on file  . Transportation needs:    Medical: Not on file    Non-medical: Not on file  Tobacco Use  . Smoking status: Never Smoker  . Smokeless tobacco: Never Used  Substance and Sexual Activity  . Alcohol use: Not Currently  . Drug use: Never  . Sexual activity: Yes    Partners: Male  Lifestyle  . Physical activity:    Days per week: Not on file    Minutes per session: Not on file  . Stress: Not on file  Relationships  . Social connections:    Talks on phone: Not on file    Gets together: Not on file    Attends religious service: Not on file    Active member of club or organization: Not on file    Attends meetings of clubs or organizations: Not on file    Relationship status: Not on  file  . Intimate partner violence:    Fear of current or ex partner: Not on file    Emotionally abused: Not on file    Physically abused: Not on file    Forced sexual activity: Not on file  Other Topics Concern  . Not on file  Social History Narrative  . Not on file   The PMH, PSH, Social History, Family History, Medications, and allergies have been reviewed in [facility], and have been updated if relevant.   Review of Systems  Psychiatric/Behavioral: Positive for decreased concentration, dysphoric mood and sleep disturbance. Negative for agitation, behavioral problems, confusion, hallucinations, self-injury and suicidal ideas. The patient is nervous/anxious. The patient is not hyperactive.   All other systems reviewed and are negative.      Objective:    BP 120/78 (BP Location: Left Arm, Patient Position: Sitting, Cuff Size: Normal)   Pulse 78   Temp 98.7 F (37.1 C) (Oral)   Ht 5\' 4"  (1.626 m)   Wt 121 lb 3.2 oz (55 kg)   LMP 08/05/2018 (Exact Date)   SpO2 97%   BMI 20.80 kg/m    Physical Exam  Constitutional: She is oriented to person, place, and time. She appears well-developed and well-nourished. No distress.  HENT:  Head: Normocephalic and atraumatic.  Eyes: EOM are normal.  Neck: Normal range of motion.  Cardiovascular: Normal rate.  Pulmonary/Chest: Effort normal.  Musculoskeletal: Normal range of motion.  Neurological: She is alert and oriented to person, place, and time.  Skin: Skin is warm and dry. She is not diaphoretic.  Psychiatric: She has a normal mood and affect. Her behavior is normal. Judgment and thought content normal.  Nursing note and vitals reviewed.         Assessment & Plan:   Adjustment disorder with mixed anxiety and depressed mood  Adjustment insomnia No follow-ups on file.

## 2018-08-09 NOTE — Patient Instructions (Signed)
Great to see you! Happy birthday! 

## 2018-08-09 NOTE — Assessment & Plan Note (Addendum)
>  25 minutes spent in face to face time with patient, >50% spent in counselling or coordination of care discussing anxiety, depression and insomnia. D/c zoloft. Discussed tx options- she is declining psychiatry and psychotherapy referrals at this time. After discussing medication options, she would like to try celexa 20 mg daily. She will up date me in a few weeks.

## 2018-08-09 NOTE — Assessment & Plan Note (Signed)
She did not tolerate trazodone- d/c and add to intolerance list.  The problem of recurrent insomnia is discussed. Avoidance of caffeine sources is strongly encouraged. Sleep hygiene issues are reviewed. The use of sedative hypnotics for temporary relief is appropriate; we discussed the addictive nature of these drugs, and a one-time only prescription for prn use of a hypnotic is given.  eRx sent for sonata to pharmacy on file.

## 2018-09-08 MED FILL — HYDROCHLOROTHIAZIDE 25 MG T: 25 | 90 days supply | Qty: 90 | Fill #1

## 2018-09-27 ENCOUNTER — Encounter: Payer: Self-pay | Admitting: Family Medicine

## 2018-09-27 ENCOUNTER — Ambulatory Visit (INDEPENDENT_AMBULATORY_CARE_PROVIDER_SITE_OTHER): Payer: No Typology Code available for payment source | Admitting: Family Medicine

## 2018-09-27 DIAGNOSIS — J40 Bronchitis, not specified as acute or chronic: Secondary | ICD-10-CM | POA: Diagnosis not present

## 2018-09-27 MED ORDER — PREDNISONE 20 MG PO TABS: 40.0000 mg | ORAL_TABLET | Freq: Every day | ORAL | 0 refills | Status: AC

## 2018-09-27 MED ORDER — HYDROCOD POLST-CPM POLST ER 10-8 MG/5ML PO SUER: 5.0000 mL | Freq: Two times a day (BID) | ORAL | 0 refills | Status: DC | PRN

## 2018-09-27 MED FILL — HYDROCODONE-CHLORPHEN ER SU: 10-8 | 14 days supply | Qty: 140 | Fill #0

## 2018-09-27 MED FILL — predniSONE 20 MG TABS: 20 | 5 days supply | Qty: 10 | Fill #0

## 2018-09-27 NOTE — Patient Instructions (Addendum)
Your symptoms seem be caused by a viral illness Antibiotics likely will not help your symptoms improve at this time.  Start prednisone Use cough syrup as needed Stay well hydrated I would suggest keeping a humidifier by your bed as well.

## 2018-09-27 NOTE — Assessment & Plan Note (Signed)
-  Discussed most cases of bronchitis are viral in nature and this should improve antibiotics.  Discussed that cough can linger for a few weeks afterwards though.  -Rx for prednisone and tussionex  -Recommend staying well hydrated and use of humidifier in the home.  -Contact clinic for continued worsening or if not improving.

## 2018-09-27 NOTE — Progress Notes (Signed)
Victoria Maddox - 42 y.o. female MRN [id number]  Date of birth: November 11, 1975  Subjective Chief Complaint  Patient presents with  . Cough  . Fever    started last week     HPI Victoria Maddox is a 42 y.o. female here tday with complaint of cough.  She reports that cough started about 4-5 days ago.  Initially had low fever but this only lasted a day or two.  Cough has been non-productive.  She denies congestion, fever, chills, nausea, vomiting, diarrhea, shortness of breath,sore throat,  chest pain, sinus pain or body aches.   She has not tried anything for treatment so far.    ROS:  A comprehensive ROS was completed and negative except as noted per HPI  Allergies  Allergen Reactions  . Trazodone And Nefazodone     Dizziness  . Zoloft [Sertraline Hcl]     Anxiousness, lash out at kids    Past Medical History:  Diagnosis Date  . Allergy   . Depression   . Frequent headaches   . Heart murmur   . History of recurrent UTIs   . Hypertension   . Migraines   . Urinary incontinence       Social History   Socioeconomic History  . Marital status: Married    Spouse name: Not on file  . Number of children: Not on file  . Years of education: Not on file  . Highest education level: Not on file  Occupational History  . Not on file  Social Needs  . Financial resource strain: Not on file  . Food insecurity:    Worry: Not on file    Inability: Not on file  . Transportation needs:    Medical: Not on file    Non-medical: Not on file  Tobacco Use  . Smoking status: Never Smoker  . Smokeless tobacco: Never Used  Substance and Sexual Activity  . Alcohol use: Not Currently  . Drug use: Never  . Sexual activity: Yes    Partners: Male  Lifestyle  . Physical activity:    Days per week: Not on file    Minutes per session: Not on file  . Stress: Not on file  Relationships  . Social connections:    Talks on phone: Not on file    Gets together: Not on file    Attends religious  service: Not on file    Active member of club or organization: Not on file    Attends meetings of clubs or organizations: Not on file    Relationship status: Not on file  Other Topics Concern  . Not on file  Social History Narrative  . Not on file    Family History  Problem Relation Age of Onset  . Heart attack Mother   . Heart disease Mother   . Hypertension Mother   . Early death Father   . Hypertension Father   . Hypertension Sister   . Diabetes Brother   . Hypertension Brother   . Asthma Sister   . Hypertension Sister   . Hyperlipidemia Brother   . Hypertension Brother   . Mental illness Brother   . Heart disease Sister   . Hypertension Sister   . Arthritis Brother   . Diabetes Brother   . Hypertension Brother     Health Maintenance  Topic Date Due  . HIV Screening  08/10/1991  . PAP SMEAR  08/09/1997  . INFLUENZA VACCINE  06/09/2018  . TETANUS/TDAP  11/09/2024    ----------------------------------------------------------------------------------------------------------------------------------------------------------------------------------------------------------------- Physical Exam BP 118/90   Pulse 93   Temp 98.2 F (36.8 C)   Wt 125 lb (56.7 kg)   SpO2 100%   BMI 21.46 kg/m   Physical Exam  Constitutional: She is oriented to person, place, and time. She appears well-nourished. No distress.  HENT:  Head: Normocephalic and atraumatic.  Mouth/Throat: Oropharynx is clear and moist.  Eyes: No scleral icterus.  Neck: Neck supple. No thyromegaly present.  Cardiovascular: Normal rate, regular rhythm and normal heart sounds.  Pulmonary/Chest: Effort normal and breath sounds normal.  Lymphadenopathy:    She has no cervical adenopathy.  Neurological: She is alert and oriented to person, place, and time.  Skin: Skin is warm and dry.  Psychiatric: She has a normal mood and affect. Her behavior is normal.     ------------------------------------------------------------------------------------------------------------------------------------------------------------------------------------------------------------------- Assessment and Plan  Bronchitis -Discussed most cases of bronchitis are viral in nature and this should improve antibiotics.  Discussed that cough can linger for a few weeks afterwards though.  -Rx for prednisone and tussionex  -Recommend staying well hydrated and use of humidifier in the home.  -Contact clinic for continued worsening or if not improving.

## 2019-01-30 ENCOUNTER — Telehealth (INDEPENDENT_AMBULATORY_CARE_PROVIDER_SITE_OTHER): Payer: No Typology Code available for payment source | Admitting: Nurse Practitioner

## 2019-01-30 DIAGNOSIS — R6889 Other general symptoms and signs: Secondary | ICD-10-CM | POA: Diagnosis not present

## 2019-01-30 DIAGNOSIS — J9801 Acute bronchospasm: Secondary | ICD-10-CM | POA: Diagnosis not present

## 2019-01-30 DIAGNOSIS — B349 Viral infection, unspecified: Secondary | ICD-10-CM

## 2019-01-30 MED ORDER — HYDROCODONE-HOMATROPINE 5-1.5 MG/5ML PO SYRP: 5.0000 mL | ORAL_SOLUTION | Freq: Two times a day (BID) | ORAL | 0 refills | Status: DC | PRN

## 2019-01-30 MED ORDER — ALBUTEROL SULFATE 108 (90 BASE) MCG/ACT IN AEPB: 1.0000 | INHALATION_SPRAY | Freq: Four times a day (QID) | RESPIRATORY_TRACT | 0 refills | Status: DC | PRN

## 2019-01-30 MED ORDER — CHLORPHEN-PE-ACETAMINOPHEN 4-10-325 MG PO TABS: 1.0000 | ORAL_TABLET | Freq: Two times a day (BID) | ORAL | 0 refills | Status: DC

## 2019-01-30 MED ORDER — GUAIFENESIN-DM 100-10 MG/5ML PO SYRP: 5.0000 mL | ORAL_SOLUTION | ORAL | 0 refills | Status: DC | PRN

## 2019-01-30 MED FILL — HYDROCODONE-HOMATROPINE SOL: 5-1.5 | 3 days supply | Qty: 30 | Fill #0

## 2019-01-30 MED FILL — ALBUTEROL SULFATE HFA 108 (: 108 (90 BAS | 25 days supply | Qty: 18 | Fill #0

## 2019-01-30 NOTE — Progress Notes (Signed)
Virtual Visit via Telephone Note  I connected with Victoria Maddox on 01/30/19 at  4:00 PM EDT by telephone and verified that I am speaking with the correct person using two identifiers.   I discussed the limitations, risks, security and privacy concerns of performing an evaluation and management service by telephone and the availability of in person appointments. I also discussed with the patient that there may be a patient responsible charge related to this service. The patient expressed understanding and agreed to proceed.  She verified full name and DOB Contacted patient via mobile number on chart: [phone]. Call started at 03:32pm and ended at 03:45pm  History of Present Illness: URI   This is a new problem. The current episode started in the past 7 days. The problem has been unchanged. Maximum temperature: no thermometer to measure temperature. Associated symptoms include congestion, coughing, headaches, joint pain, rhinorrhea, sinus pain, sneezing and a sore throat. Pertinent negatives include no abdominal pain, chest pain, diarrhea, dysuria, ear pain, joint swelling, nausea, neck pain, plugged ear sensation, rash, swollen glands, vomiting or wheezing. Associated symptoms comments: Chills and horseness Cough is worse at night.. She has tried acetaminophen and decongestant for the symptoms. The treatment provided mild relief.  no lethargy or dizziness. no sick contact at home. She is unable to check any vital signs (no thermometer, no BP cuff at home). She is at home with children only (57yrs old twin and 68yrs old daughter). Her husband is out of town for work. denies any recent travel or known contact with COVID-19 patient.  Observations/Objective: During conversation with [name], she sounded hoarse. Able to maintain conversations without pause. She was alert and oriented x 4  Assessment and Plan: Diagnoses and all orders for this visit:  Flu-like symptoms -      HYDROcodone-homatropine (HYCODAN) 5-1.5 MG/5ML syrup; Take 5 mLs by mouth every 12 (twelve) hours as needed. -     Discontinue: Chlorphen-PE-Acetaminophen 4-10-325 MG TABS; Take 1 tablet by mouth every 12 (twelve) hours for 3 days. -     Albuterol Sulfate (PROAIR RESPICLICK) 562 (90 Base) MCG/ACT AEPB; Inhale 1-2 puffs into the lungs every 6 (six) hours as needed. For wheezing, SOB or cough  Acute bronchospasm due to viral infection -     Albuterol Sulfate (PROAIR RESPICLICK) 130 (90 Base) MCG/ACT AEPB; Inhale 1-2 puffs into the lungs every 6 (six) hours as needed. For wheezing, SOB or cough -     guaiFENesin-dextromethorphan (ROBITUSSIN DM) 100-10 MG/5ML syrup; Take 5 mLs by mouth every 4 (four) hours as needed for cough.   Follow Up Instructions: I advised her about need for supportive care at this time. This means taking prescribing cough suppressant and alternating with continue OTC mucinex-DM or robitussin, maintain adequate oral hydration and rest. She was also advised to stay home for 3-7days. She is allowed to return to work if no fever in 3days without use of tylenol or aspirin or NSAIDs, and if respiratory symptoms have improved.  Also advised to wear face surgical mask if she has to leave her home and maintain proper hand hygiene.  I discussed the assessment and treatment plan with the patient. The patient was provided an opportunity to ask questions and all were answered. The patient agreed with the plan and demonstrated an understanding of the instructions.   The patient was advised to call back or seek an in-person evaluation if the symptoms worsen or if the condition fails to improve as anticipated.  I provided  13 minutes of non-face-to-face time during this encounter.   Wilfred Lacy, NP

## 2019-01-30 NOTE — Progress Notes (Signed)
Pt c/o of dry cough,little body ache and headache. Pt stated this started 01/25/2019 with a sore throat,felt feverish. Pt tried tylenol and Nyquil otc. Pt request work note and tested for flu or covid 60? Denied travel anywhere and come in contact with pt dx with covid 19.

## 2019-01-31 ENCOUNTER — Telehealth: Payer: Self-pay | Admitting: Nurse Practitioner

## 2019-01-31 NOTE — Telephone Encounter (Signed)
In that case I will recommend for her to go to [facility] urgent care facility with surgical mask on.

## 2019-01-31 NOTE — Telephone Encounter (Signed)
Patient called again to leave a fax number for nurse [place], LPN,  Fax is [phone]

## 2019-01-31 NOTE — Telephone Encounter (Signed)
Pt call back today stated her employer call and want her to be test for flu and COVID 19 before she can go back to work. Pt stated she is unable to go back to work until she gets these tested. Please advise.

## 2019-01-31 NOTE — Telephone Encounter (Signed)
Out of work note faxed to [phone].

## 2019-01-31 NOTE — Telephone Encounter (Signed)
Pt is aware.  

## 2019-02-06 ENCOUNTER — Ambulatory Visit: Payer: No Typology Code available for payment source | Admitting: Family Medicine

## 2019-03-14 ENCOUNTER — Telehealth: Payer: Self-pay | Admitting: Family Medicine

## 2019-03-14 MED ORDER — HYDROCHLOROTHIAZIDE 25 MG PO TABS: 25.0000 mg | ORAL_TABLET | Freq: Every day | ORAL | 1 refills | Status: DC

## 2019-03-14 MED FILL — HYDROCHLOROTHIAZIDE 25 MG T: 25 | 90 days supply | Qty: 90 | Fill #0

## 2019-03-14 NOTE — Telephone Encounter (Signed)
Copied from [place] [id number]. Topic: Quick Communication - Rx Refill/Question >> Mar 14, 2019  3:02 PM Rayann Heman wrote: Medication:hydrochlorothiazide (HYDRODIURIL) 25 MG tablet [[id number]] hydrochlorothiazide (HYDRODIURIL) 25 MG tablet [[id number]]  Has the patient contacted their pharmacy?  Preferred Pharmacy (with phone number or street name): [place], [place] - [address] [phone] (Phone) [phone] (Fax)    Agent: Please be advised that RX refills may take up to 3 business days. We ask that you follow-up with your pharmacy.

## 2019-07-05 ENCOUNTER — Encounter: Payer: Self-pay | Admitting: Family Medicine

## 2019-07-05 ENCOUNTER — Other Ambulatory Visit: Payer: Self-pay

## 2019-07-05 ENCOUNTER — Ambulatory Visit (INDEPENDENT_AMBULATORY_CARE_PROVIDER_SITE_OTHER): Payer: No Typology Code available for payment source | Admitting: Family Medicine

## 2019-07-05 DIAGNOSIS — G43009 Migraine without aura, not intractable, without status migrainosus: Secondary | ICD-10-CM

## 2019-07-05 MED ORDER — SUMATRIPTAN SUCCINATE 50 MG PO TABS: ORAL_TABLET | ORAL | 2 refills | Status: DC

## 2019-07-05 MED FILL — SUMATRIPTAN SUCC 50 MG TAB: 50 | 30 days supply | Qty: 18 | Fill #0

## 2019-07-05 NOTE — Progress Notes (Signed)
Virtual Visit via Video Note  I connected with Victoria Maddox on 07/05/19 at  2:00 PM EDT by a video enabled telemedicine application and verified that I am speaking with the correct person using two identifiers. Location patient: home Location provider: home office Persons participating in the virtual visit: patient, provider  I discussed the limitations of evaluation and management by telemedicine and the availability of in person appointments. The patient expressed understanding and agreed to proceed.  Chief Complaint  Patient presents with  . migraines    HPI: Victoria Maddox is a 43 y.o. female who is a patient of Dr. Deborra Medina who complains of migraine headache x 5 days. She endorses nausea and lightheadedness x 1 episode. She endorses sensitivity to light and sound. No fever, chills, blurry vision, vomiting, nasal congestion, sinus pressure, runny nose, sore throat. Pt has been taking ibuprofen, tylenol, excedrin with brief relief.  Pt has a h/o migraine headaches but does not take any Rx daily or PRN meds. Pt believes stress and not enough sleep triggers her headaches.  Past Medical History:  Diagnosis Date  . Allergy   . Depression   . Frequent headaches   . Heart murmur   . History of recurrent UTIs   . Hypertension   . Migraines   . Urinary incontinence     History reviewed. No pertinent surgical history.  Family History  Problem Relation Age of Onset  . Heart attack Mother   . Heart disease Mother   . Hypertension Mother   . Early death Father   . Hypertension Father   . Hypertension Sister   . Diabetes Brother   . Hypertension Brother   . Asthma Sister   . Hypertension Sister   . Hyperlipidemia Brother   . Hypertension Brother   . Mental illness Brother   . Heart disease Sister   . Hypertension Sister   . Arthritis Brother   . Diabetes Brother   . Hypertension Brother     Social History   Tobacco Use  . Smoking status: Never Smoker  . Smokeless  tobacco: Never Used  Substance Use Topics  . Alcohol use: Not Currently  . Drug use: Never     Current Outpatient Medications:  .  acetaminophen (TYLENOL) 325 MG tablet, Take 650 mg by mouth every 6 (six) hours as needed., Disp: , Rfl:  .  Albuterol Sulfate (PROAIR RESPICLICK) 123XX123 (90 Base) MCG/ACT AEPB, Inhale 1-2 puffs into the lungs every 6 (six) hours as needed. For wheezing, SOB or cough, Disp: 1 each, Rfl: 0 .  diphenhydrAMINE (BENADRYL) 25 mg capsule, Take 25 mg by mouth at bedtime as needed., Disp: , Rfl:  .  hydrochlorothiazide (HYDRODIURIL) 25 MG tablet, Take 1 tablet (25 mg total) by mouth daily., Disp: 90 tablet, Rfl: 1 .  verapamil (CALAN) 80 MG tablet, Take 1 tablet (80 mg total) by mouth 3 (three) times daily. (Patient taking differently: Take 80 mg by mouth once. ), Disp: 270 tablet, Rfl: 1  Allergies  Allergen Reactions  . Trazodone And Nefazodone     Dizziness  . Zoloft [Sertraline Hcl]     Anxiousness, lash out at kids      ROS: See pertinent positives and negatives per HPI.   EXAM:  VITALS per patient if applicable: There were no vitals taken for this visit.   GENERAL: alert, oriented, in no acute distress  HEENT: atraumatic, conjunctiva clear, no obvious abnormalities on inspection of external nose and ears  NECK:  normal movements of the head and neck  LUNGS: on inspection no signs of respiratory distress, breathing rate appears normal, no obvious gross SOB, gasping or wheezing, no conversational dyspnea  CV: no obvious cyanosis  PSYCH/NEURO: pleasant and cooperative, speech and thought processing grossly intact   ASSESSMENT AND PLAN:  1. Migraine without aura and without status migrainosus, not intractable - increase water intake Rx: - SUMAtriptan (IMITREX) 50 MG tablet; 1 tab po x 1 dose, may repeat in 2hrs x 1 dose if needed  Dispense: 10 tablet; Refill: 2 - ok to take tylenol or ibuprofen PRN if needed - f/u if headaches persist or worsen    I discussed the assessment and treatment plan with the patient. The patient was provided an opportunity to ask questions and all were answered. The patient agreed with the plan and demonstrated an understanding of the instructions.   The patient was advised to call back or seek an in-person evaluation if the symptoms worsen or if the condition fails to improve as anticipated.   Letta Median, DO

## 2019-07-06 ENCOUNTER — Other Ambulatory Visit: Payer: Self-pay | Admitting: Family Medicine

## 2019-07-06 MED FILL — HYDROCHLOROTHIAZIDE 25 MG T: 25 | 90 days supply | Qty: 90 | Fill #1

## 2019-07-10 MED FILL — VERAPAMIL 80 MG TABLET: 80 | 90 days supply | Qty: 270 | Fill #0

## 2019-08-04 MED FILL — HYDROCHLOROTHIAZIDE 25 MG T: 25 | 90 days supply | Qty: 90 | Fill #1

## 2019-08-04 MED FILL — VERAPAMIL 80 MG TABLET: 80 | 90 days supply | Qty: 270 | Fill #0

## 2019-09-29 ENCOUNTER — Ambulatory Visit: Payer: Self-pay

## 2019-09-29 NOTE — Telephone Encounter (Signed)
   Reason for Disposition . Abdominal pain  (Exception: Pain clears with each passage of diarrhea stool)  Answer Assessment - Initial Assessment Questions 1. DIARRHEA SEVERITY: "How bad is the diarrhea?" "How many extra stools have you had in the past 24 hours than normal?"    - NO DIARRHEA (SCALE 0)   - MILD (SCALE 1-3): Few loose or mushy BMs; increase of 1-3 stools over normal daily number of stools; mild increase in ostomy output.   -  MODERATE (SCALE 4-7): Increase of 4-6 stools daily over normal; moderate increase in ostomy output. * SEVERE (SCALE 8-10; OR 'WORST POSSIBLE'): Increase of 7 or more stools daily over normal; moderate increase in ostomy output; incontinence.     4 2. ONSET: "When did the diarrhea begin?"      Today 3. BM CONSISTENCY: "How loose or watery is the diarrhea?"      Loose 4. VOMITING: "Are you also vomiting?" If so, ask: "How many times in the past 24 hours?"      1 5. ABDOMINAL PAIN: "Are you having any abdominal pain?" If yes: "What does it feel like?" (e.g., crampy, dull, intermittent, constant)      Yes - cramp 6. ABDOMINAL PAIN SEVERITY: If present, ask: "How bad is the pain?"  (e.g., Scale 1-10; mild, moderate, or severe)   - MILD (1-3): doesn't interfere with normal activities, abdomen soft and not tender to touch    - MODERATE (4-7): interferes with normal activities or awakens from sleep, tender to touch    - SEVERE (8-10): excruciating pain, doubled over, unable to do any normal activities       4 7. ORAL INTAKE: If vomiting, "Have you been able to drink liquids?" "How much fluids have you had in the past 24 hours?"     Yes 8. HYDRATION: "Any signs of dehydration?" (e.g., dry mouth [not just dry lips], too weak to stand, dizziness, new weight loss) "When did you last urinate?"     No 9. EXPOSURE: "Have you traveled to a foreign country recently?" "Have you been exposed to anyone with diarrhea?" "Could you have eaten any food that was spoiled?"  No 10. ANTIBIOTIC USE: "Are you taking antibiotics now or have you taken antibiotics in the past 2 months?"       No 11. OTHER SYMPTOMS: "Do you have any other symptoms?" (e.g., fever, blood in stool)       No 12. PREGNANCY: "Is there any chance you are pregnant?" "When was your last menstrual period?"       No  Protocols used: DIARRHEA-A-AH

## 2019-09-30 ENCOUNTER — Ambulatory Visit (INDEPENDENT_AMBULATORY_CARE_PROVIDER_SITE_OTHER): Payer: No Typology Code available for payment source | Admitting: Family Medicine

## 2019-09-30 ENCOUNTER — Encounter: Payer: Self-pay | Admitting: Family Medicine

## 2019-09-30 DIAGNOSIS — K529 Noninfective gastroenteritis and colitis, unspecified: Secondary | ICD-10-CM | POA: Diagnosis not present

## 2019-09-30 MED ORDER — ONDANSETRON HCL 4 MG PO TABS: 4.0000 mg | ORAL_TABLET | Freq: Three times a day (TID) | ORAL | 0 refills | Status: DC | PRN

## 2019-09-30 NOTE — Progress Notes (Signed)
Virtual Visit via Video Note  I connected with Victoria Maddox on 09/30/19 at  9:20 AM EST by a video enabled telemedicine application and verified that I am speaking with the correct person using two identifiers.  Location: Patient: home with children Provider: home    I discussed the limitations of evaluation and management by telemedicine and the availability of in person appointments. The patient expressed understanding and agreed to proceed.  History of Present Illness: Pt is home c/o diarhea and nausea and vomiting since yesterday.  She has had none today but c/o headache.  Tylenol helps  She denies fever but has not been able to check it She was able to hold down some ginger ale today   Observations/Objective: No vitals obtained Pt looks lethargic but she is alert and able to answer questions  Pt is in NAD   Assessment and Plan: 1. Gastroenteritis Brat diet  Clear liquids today and advance as tolerted If zofran does not help or headache or stomach ache worsens--- GO To ER Pt understands and says her neighbor can take her  - ondansetron (ZOFRAN) 4 MG tablet; Take 1 tablet (4 mg total) by mouth every 8 (eight) hours as needed for nausea or vomiting.  Dispense: 20 tablet; Refill: 0   Follow Up Instructions:    I discussed the assessment and treatment plan with the patient. The patient was provided an opportunity to ask questions and all were answered. The patient agreed with the plan and demonstrated an understanding of the instructions.   The patient was advised to call back or seek an in-person evaluation if the symptoms worsen or if the condition fails to improve as anticipated.  I provided 15 minutes of non-face-to-face time during this encounter.   Ann Held, DO

## 2019-11-23 LAB — RESULTS CONSOLE HPV: CHL HPV: NEGATIVE

## 2019-11-23 LAB — HM PAP SMEAR: HM Pap smear: NEGATIVE

## 2019-11-23 MED FILL — MEDROXYPROGESTERONE 10 MG T: 10 | 7 days supply | Qty: 7 | Fill #0

## 2019-11-24 ENCOUNTER — Other Ambulatory Visit: Payer: Self-pay | Admitting: Family Medicine

## 2019-11-27 MED FILL — HYDROCHLOROTHIAZIDE 25 MG T: 25 | 90 days supply | Qty: 90 | Fill #0

## 2019-12-18 MED FILL — MEDROXYPROGESTERONE 10 MG T: 10 | 9 days supply | Qty: 9 | Fill #0

## 2020-01-11 LAB — HM MAMMOGRAPHY

## 2020-01-17 ENCOUNTER — Other Ambulatory Visit: Payer: Self-pay | Admitting: Obstetrics & Gynecology

## 2020-01-17 DIAGNOSIS — R928 Other abnormal and inconclusive findings on diagnostic imaging of breast: Secondary | ICD-10-CM

## 2020-01-19 ENCOUNTER — Encounter: Payer: Self-pay | Admitting: Family Medicine

## 2020-01-19 NOTE — Progress Notes (Signed)
[facility]/thx dmf 

## 2020-01-23 ENCOUNTER — Other Ambulatory Visit: Payer: Self-pay

## 2020-01-23 ENCOUNTER — Other Ambulatory Visit: Payer: Self-pay | Admitting: Obstetrics & Gynecology

## 2020-01-23 ENCOUNTER — Ambulatory Visit
Admission: RE | Admit: 2020-01-23 | Discharge: 2020-01-23 | Disposition: A | Discharge status: Home / Self Care | Payer: No Typology Code available for payment source | Source: Ambulatory Visit | Attending: Obstetrics & Gynecology | Admitting: Obstetrics & Gynecology

## 2020-01-23 DIAGNOSIS — R921 Mammographic calcification found on diagnostic imaging of breast: Secondary | ICD-10-CM

## 2020-01-23 DIAGNOSIS — R928 Other abnormal and inconclusive findings on diagnostic imaging of breast: Secondary | ICD-10-CM

## 2020-01-23 IMAGING — MG DIGITAL DIAGNOSTIC UNILAT LEFT W/ CAD
3 series · 3 of 3 positions shown · non-contrast
Comparison: Baseline screening mammogram dated [DATE].

CLINICAL DATA: Patient returns today to evaluate LEFT breast
calcifications identified on a recent baseline screening mammogram.

EXAM:
DIGITAL DIAGNOSTIC LEFT MAMMOGRAM WITH CAD

[L ML (1 of 2)]
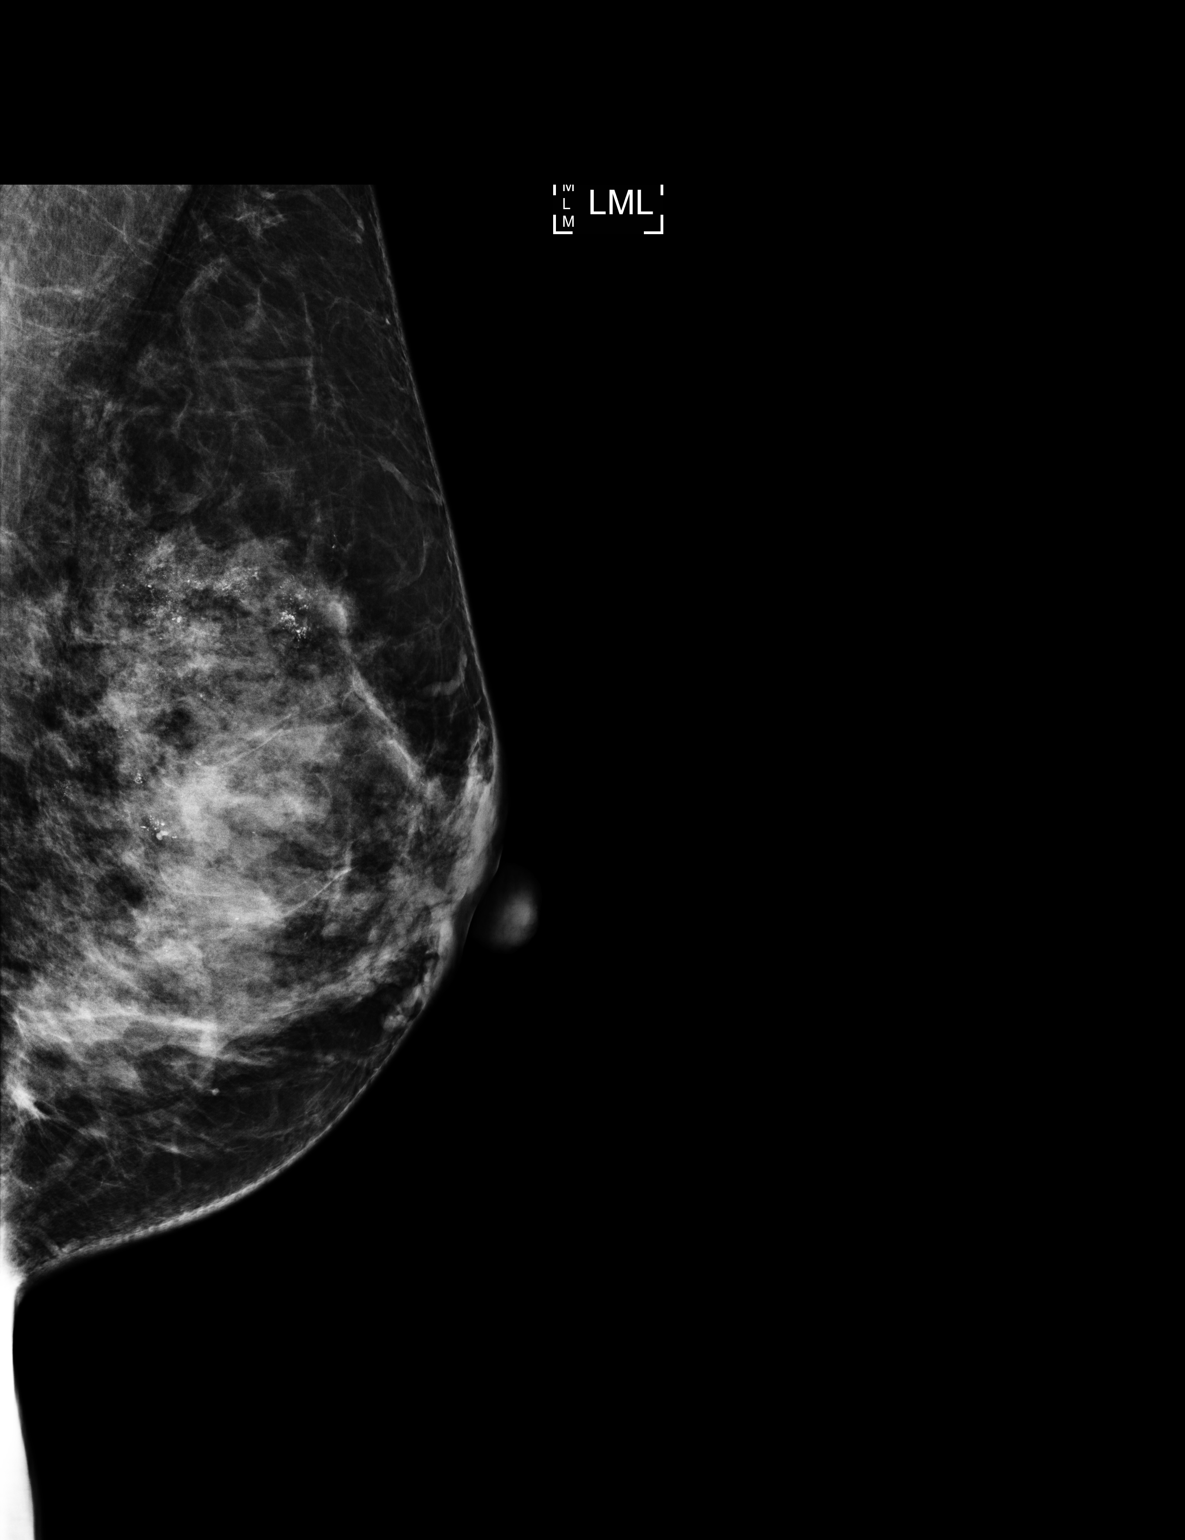

[L CC]
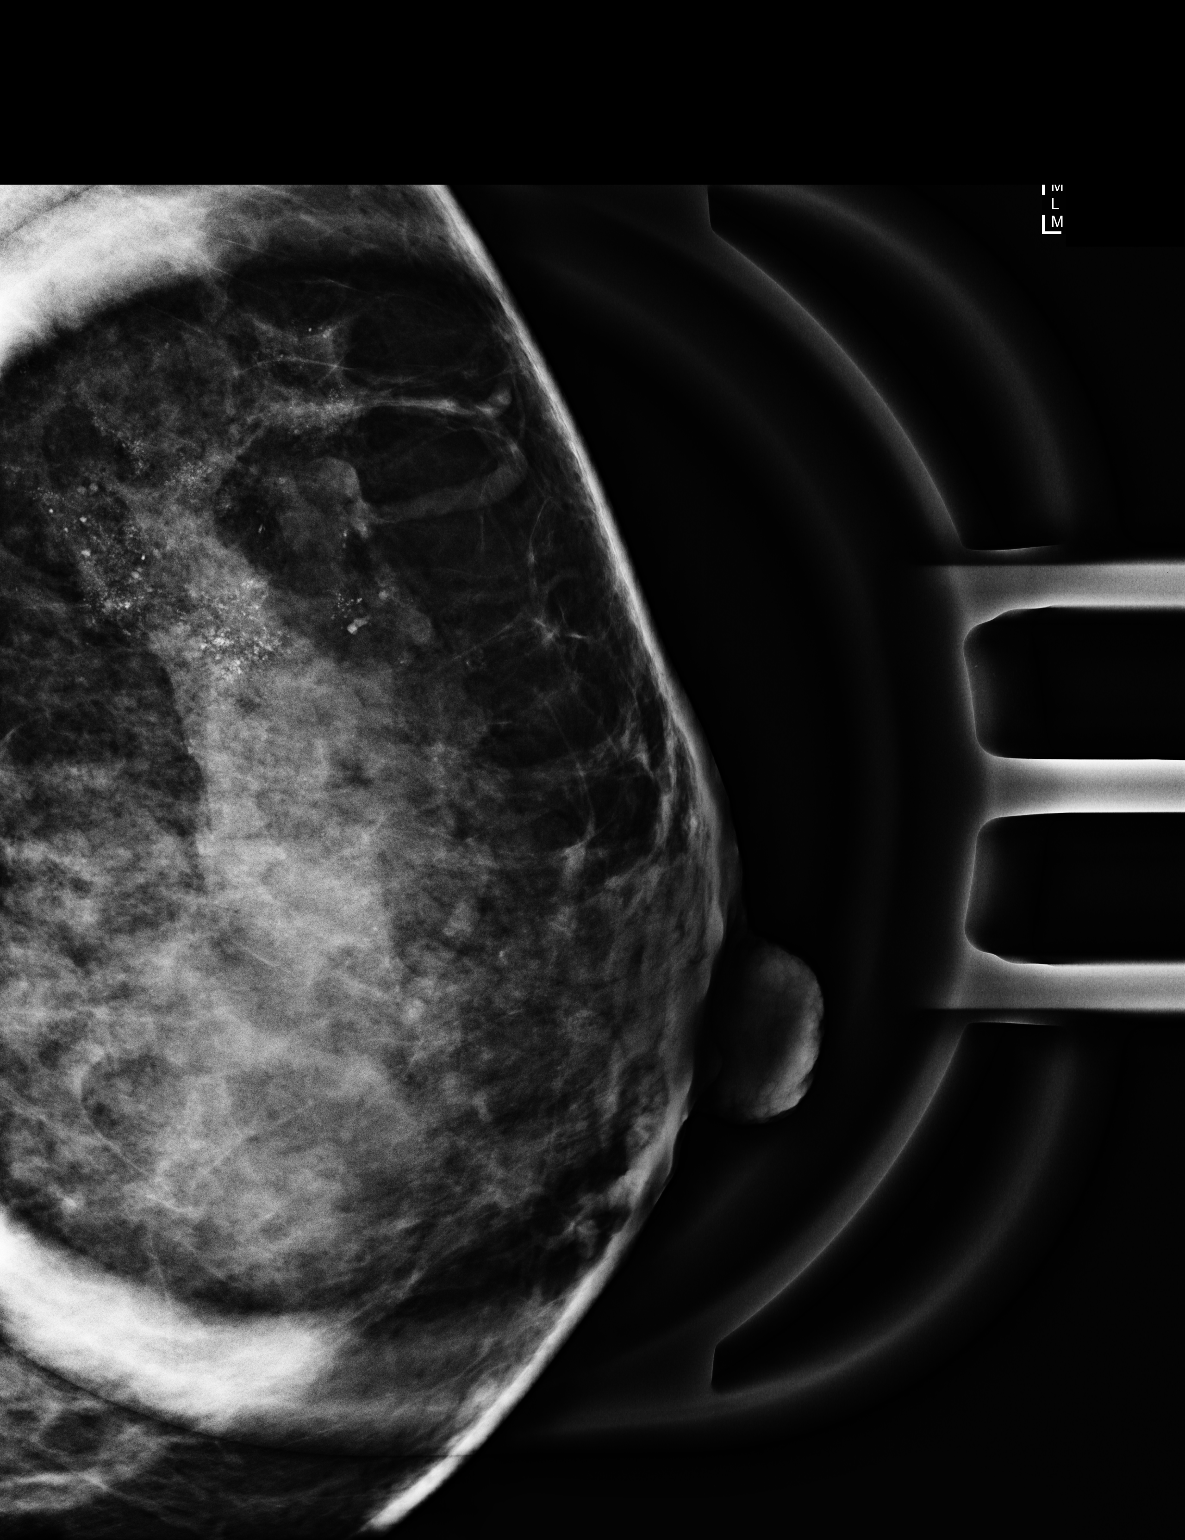

[L ML (2 of 2)]
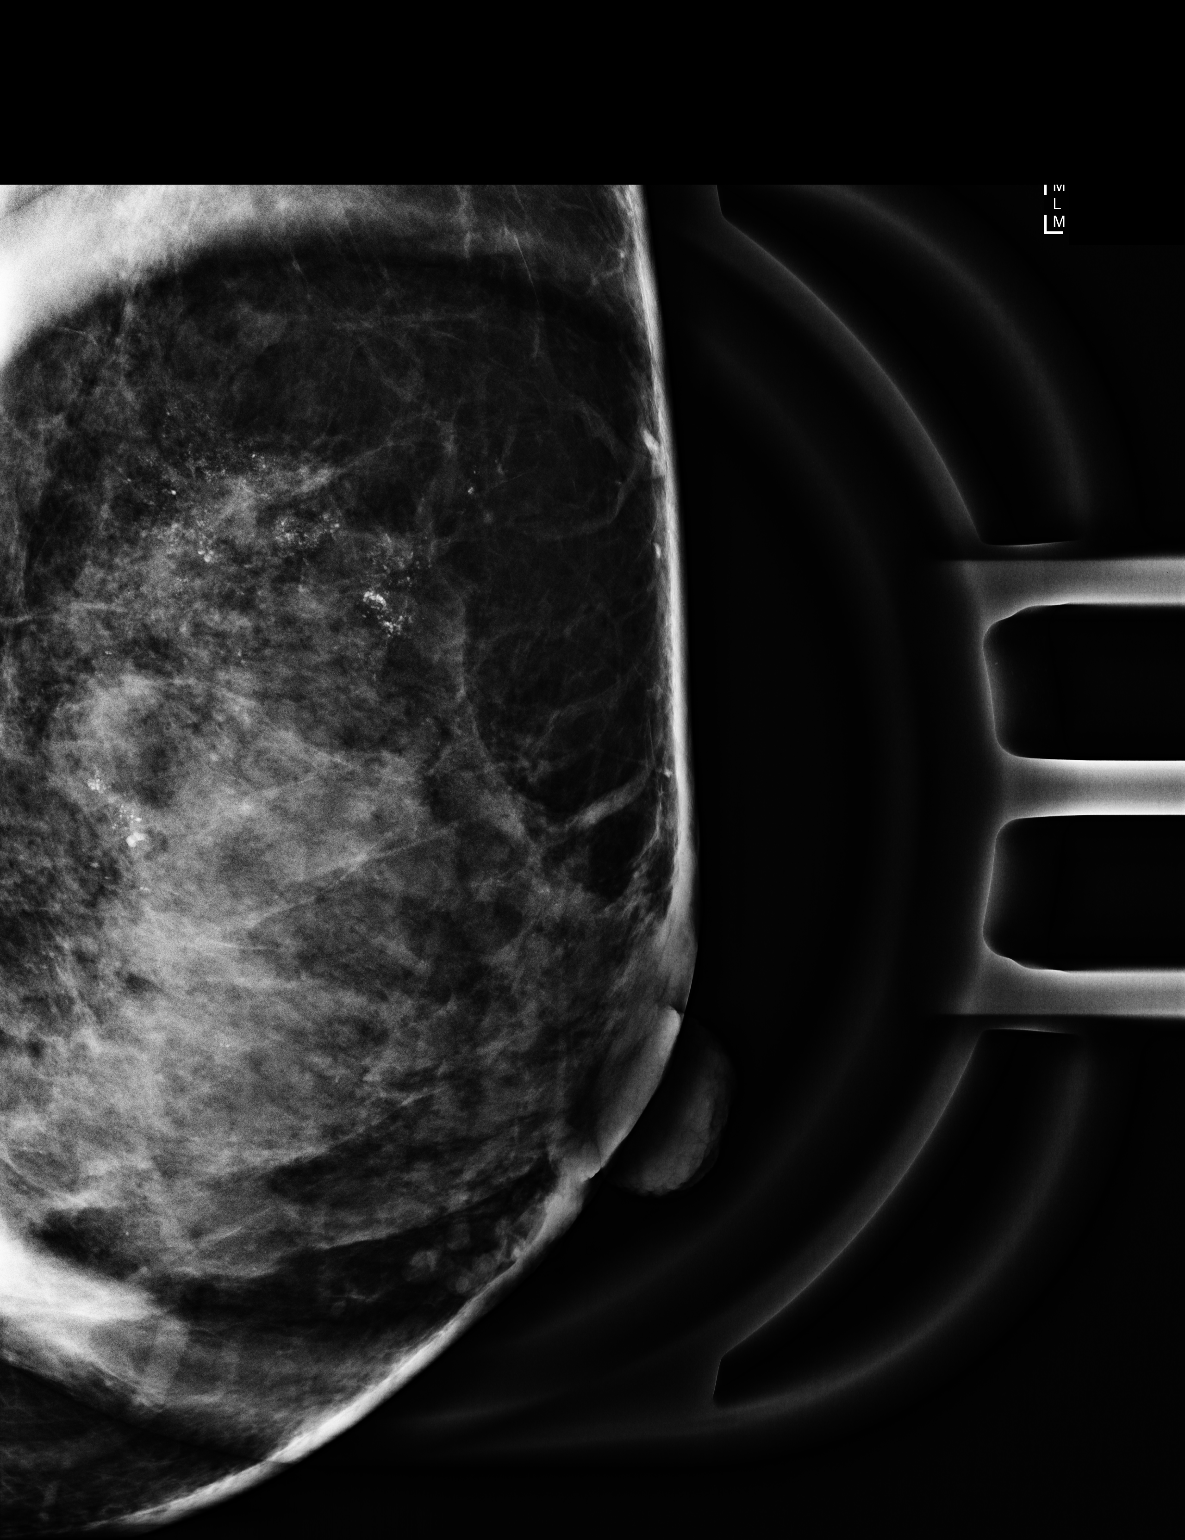

[3 of 3 positions shown; findings below may reference images not displayed]

ACR Breast Density Category c: The breast tissue is heterogeneously
dense, which may obscure small masses.
FINDINGS: On today's additional diagnostic views, including magnification
views, grouped punctate and coarse heterogeneous calcifications are
confirmed within the upper-outer quadrant of the LEFT breast,
measuring 2.9 cm extent, with a suspicious segmental distribution.
There are additional scattered layering calcifications within the
central LEFT breast suggesting benign milk of calcium.

Mammographic images were processed with CAD.
IMPRESSION: Grouped punctate and coarse heterogeneous calcifications within the
upper-outer quadrant of the LEFT breast, measuring 2.9 cm extent,
with a suspicious segmental distribution. Some additional
calcifications within the central LEFT breast have a layering
appearance suggesting benign milk of calcium. Recommend stereotactic
biopsy for the calcifications in the upper LEFT breast to ensure
benignity.

RECOMMENDATION:
1. Stereotactic biopsy for the grouped punctate and coarse
heterogeneous calcifications in the upper outer quadrant of the LEFT
breast.
2. If benign pathology result, would then recommend follow-up LEFT
breast diagnostic mammogram in 6 months to ensure stability of the
other calcifications in the LEFT breast.
3. If biopsy reveals malignancy, would recommend at least 2
additional stereotactic biopsies to determine extent of disease.

Stereotactic biopsy is scheduled for [REDACTED].

I have discussed the findings and recommendations with the patient.
If applicable, a reminder letter will be sent to the patient
regarding the next appointment.

BI-RADS CATEGORY  4: Suspicious.

## 2020-01-29 ENCOUNTER — Other Ambulatory Visit: Payer: Self-pay

## 2020-01-29 ENCOUNTER — Ambulatory Visit
Admission: RE | Admit: 2020-01-29 | Discharge: 2020-01-29 | Disposition: A | Discharge status: Home / Self Care | Payer: No Typology Code available for payment source | Source: Ambulatory Visit | Attending: Obstetrics & Gynecology | Admitting: Obstetrics & Gynecology

## 2020-01-29 DIAGNOSIS — R921 Mammographic calcification found on diagnostic imaging of breast: Secondary | ICD-10-CM

## 2020-01-29 HISTORY — PX: BREAST BIOPSY: SHX20

## 2020-01-29 IMAGING — MG MM BREAST LOCALIZATION CLIP
4 series · 4 of 12 positions shown · non-contrast
Comparison: Previous exam(s).

CLINICAL DATA: Stereotactic biopsy was performed of suspicious
calcifications in the upper-outer quadrant of the left breast.

EXAM:
DIAGNOSTIC LEFT MAMMOGRAM POST STEREOTACTIC BIOPSY

[L ML synth-2D]
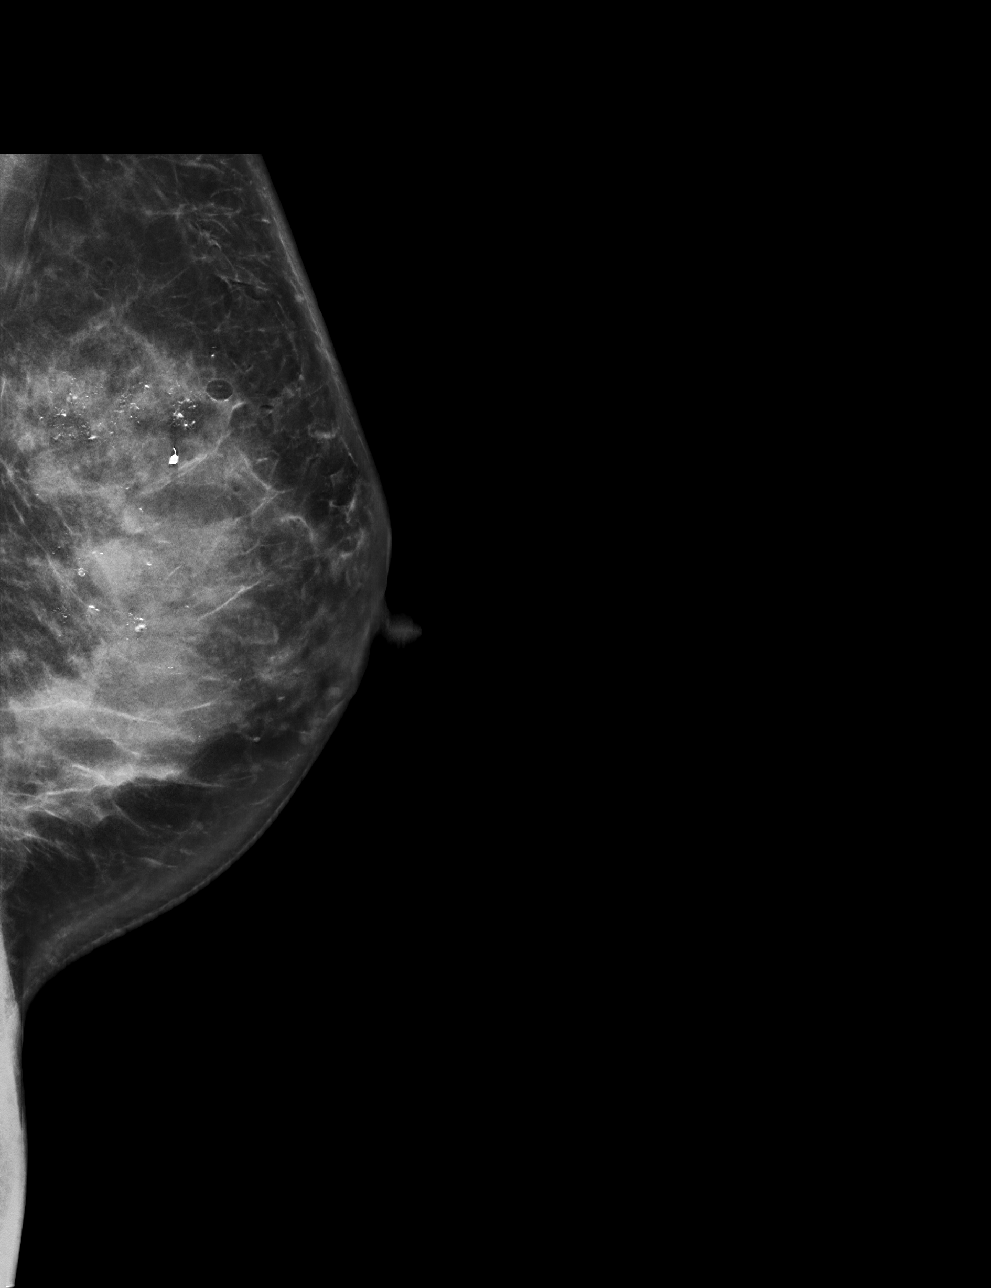

[L CC synth-2D]
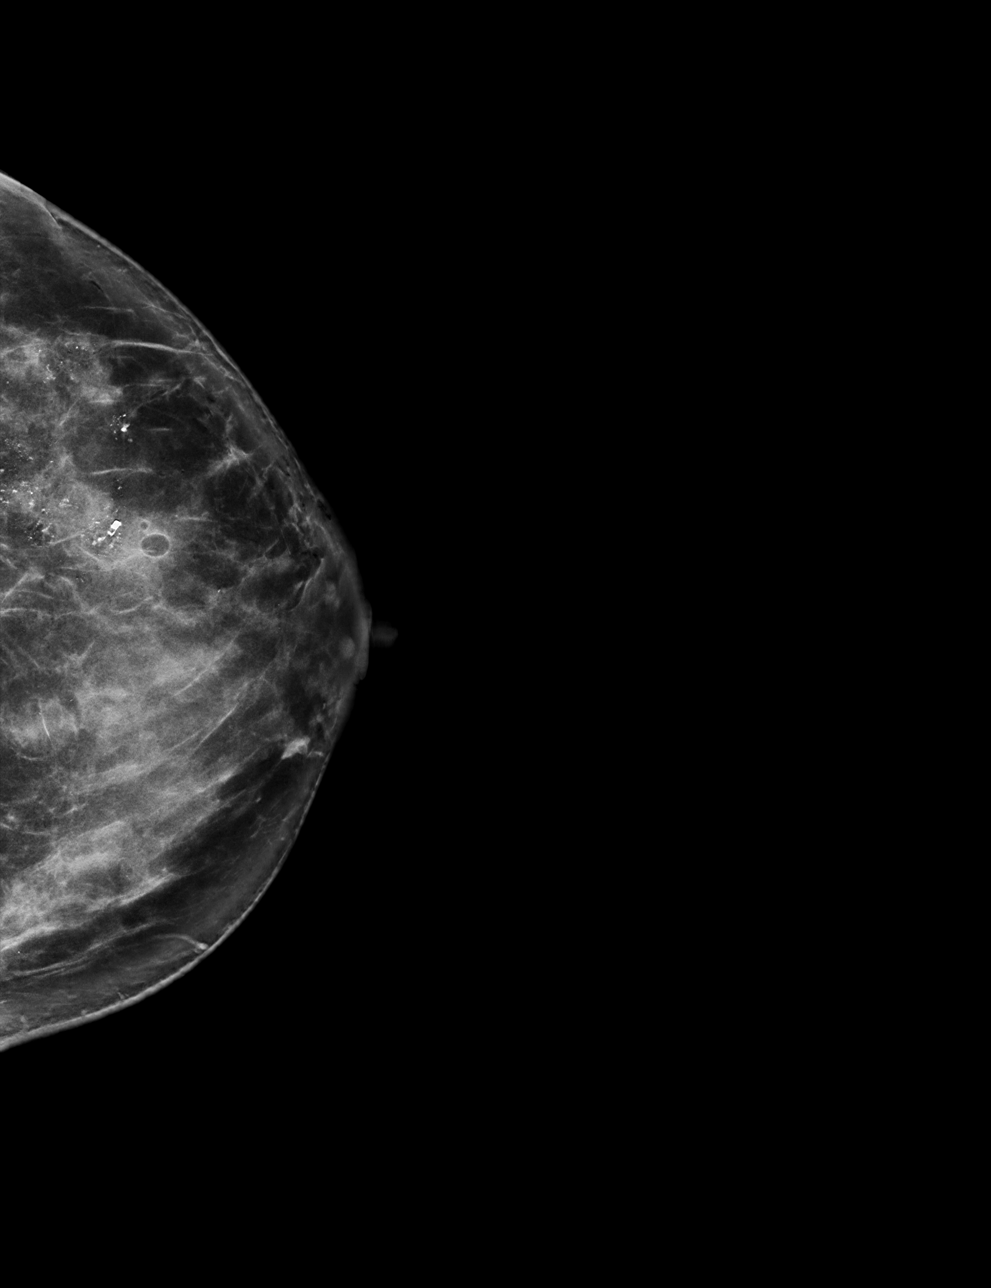

[L CC tomo · tomo slice 37/73.0]
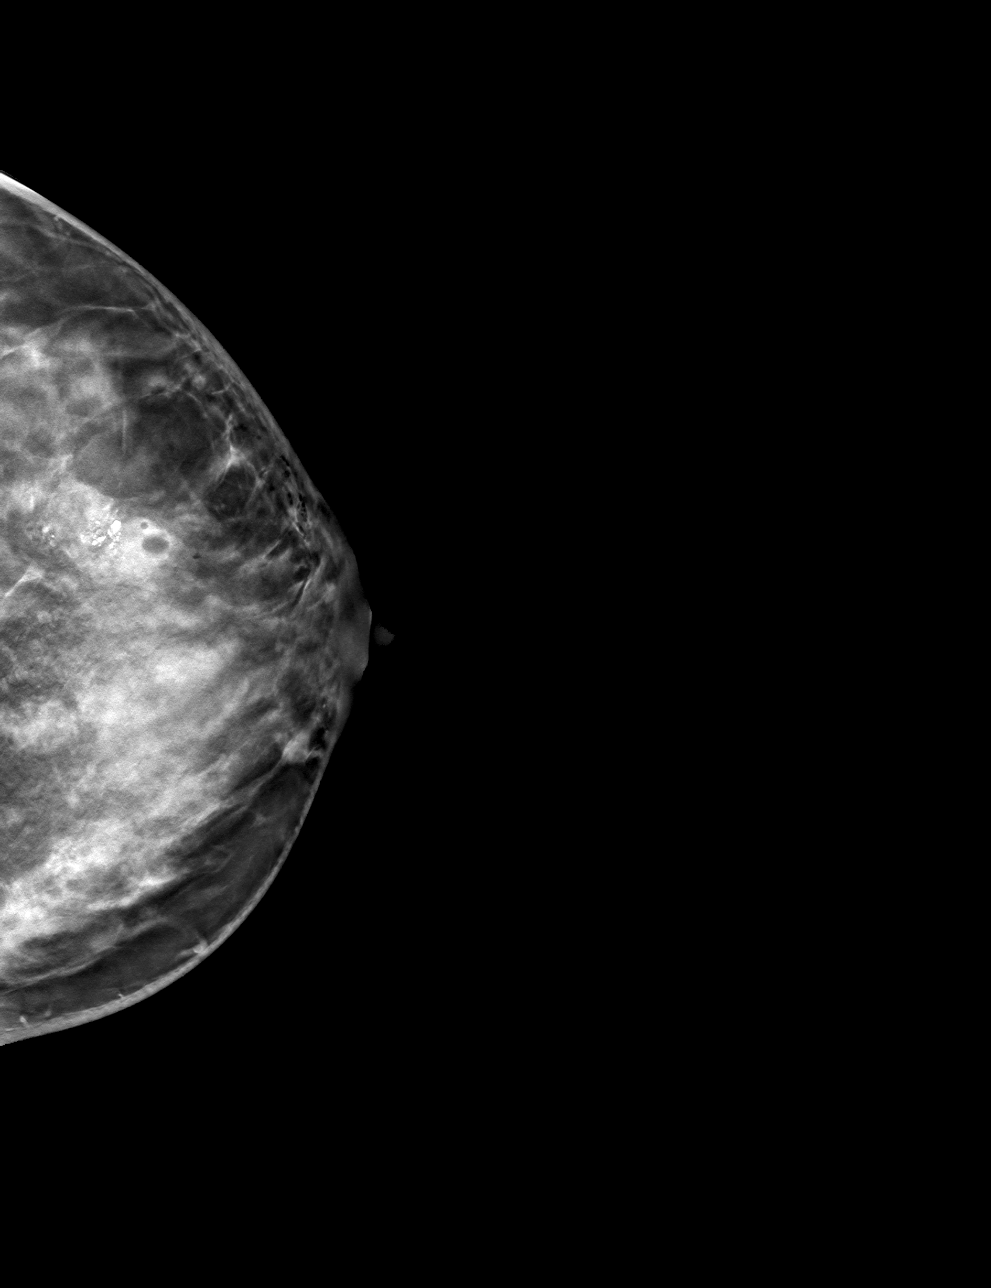

[L ML tomo · tomo slice 37/72.0]
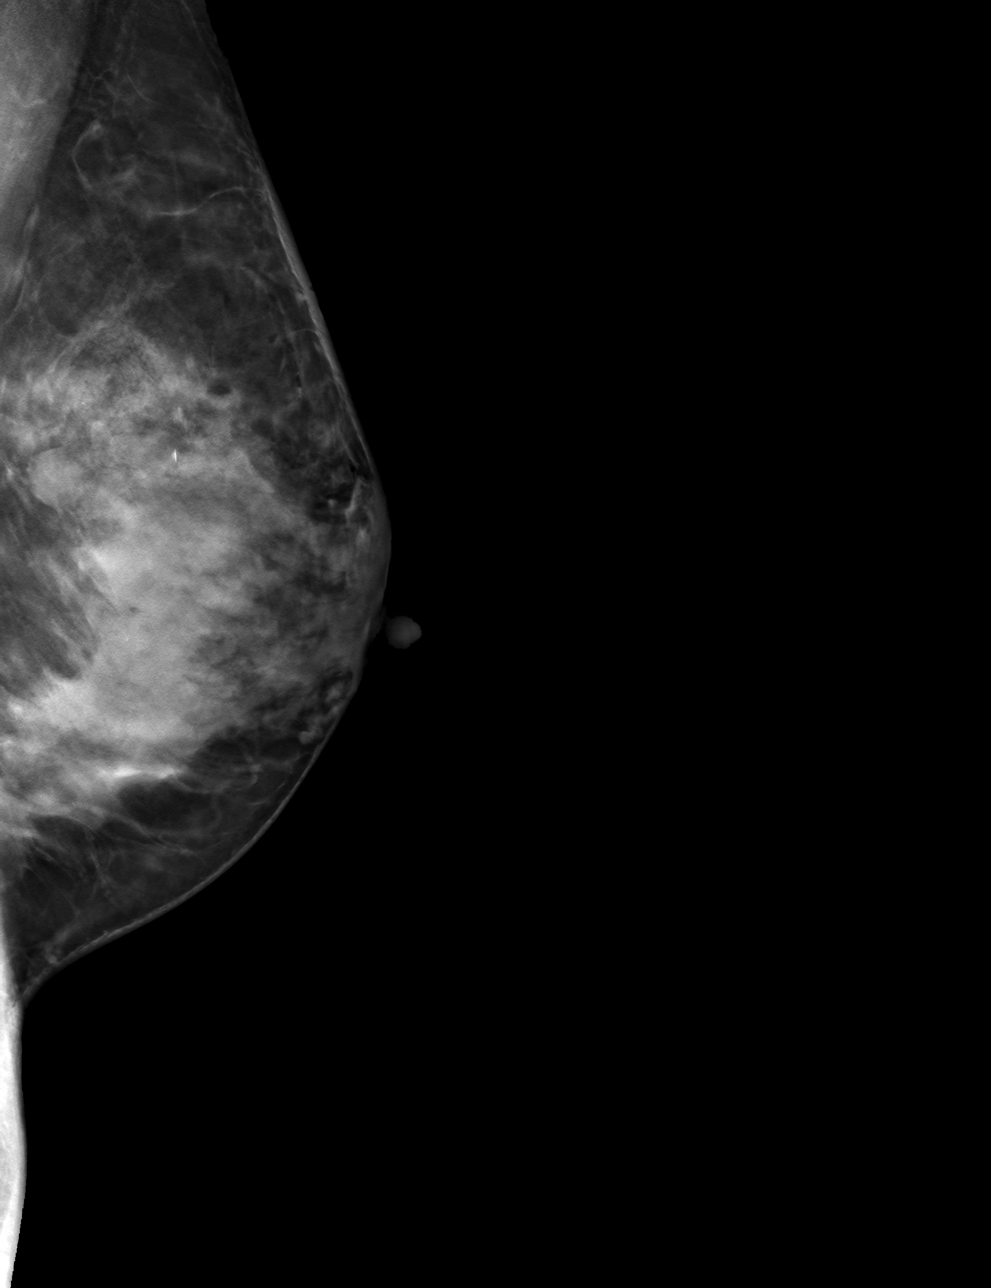

[4 of 12 positions shown; findings below may reference images not displayed]

FINDINGS: Mammographic images were obtained following stereotactic guided
biopsy of the left breast. The biopsy marking clip is in expected
position at the site of biopsy.
IMPRESSION: Appropriate positioning of the coil shaped biopsy marking clip at
the site of biopsy in the upper outer quadrant.

Final Assessment: Post Procedure Mammograms for Marker Placement

## 2020-01-29 IMAGING — MG MM BREAST BX W LOC DEV 1ST LESION IMAGE BX SPEC STEREO GUIDE*L*
7 of 10 series · 7 of 14 positions shown · non-contrast
Comparison: Previous exams.
COMPARISON: Previous exams.
COMPARISON: Previous exams.

Addendum:
CLINICAL DATA: Stereotactic biopsy recommended of heterogeneous
calcifications in the upper-outer quadrant of the left breast.

EXAM:
LEFT BREAST STEREOTACTIC CORE NEEDLE BIOPSY

[L (1 of 6)]
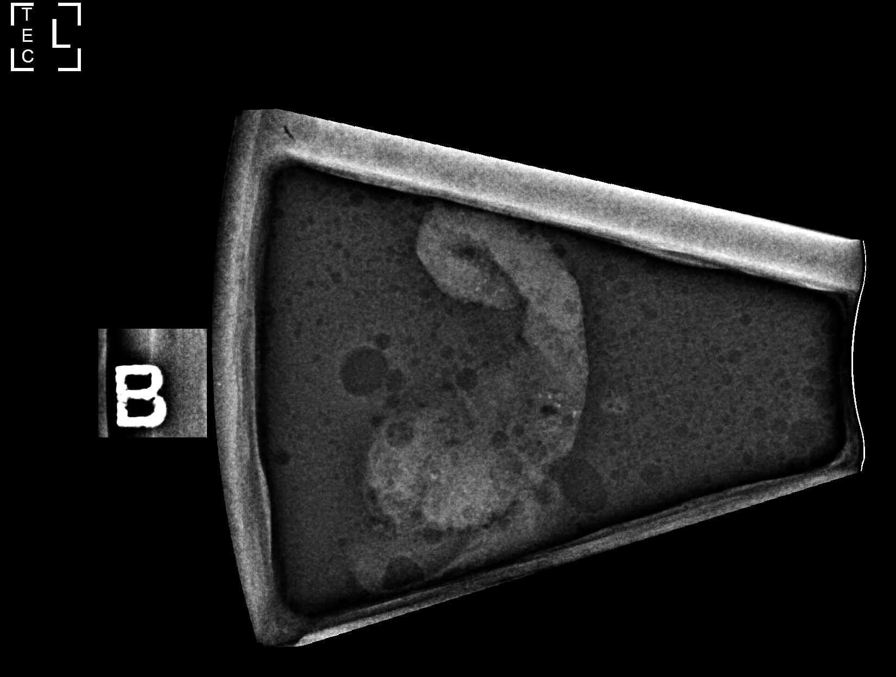

[L (2 of 6)]
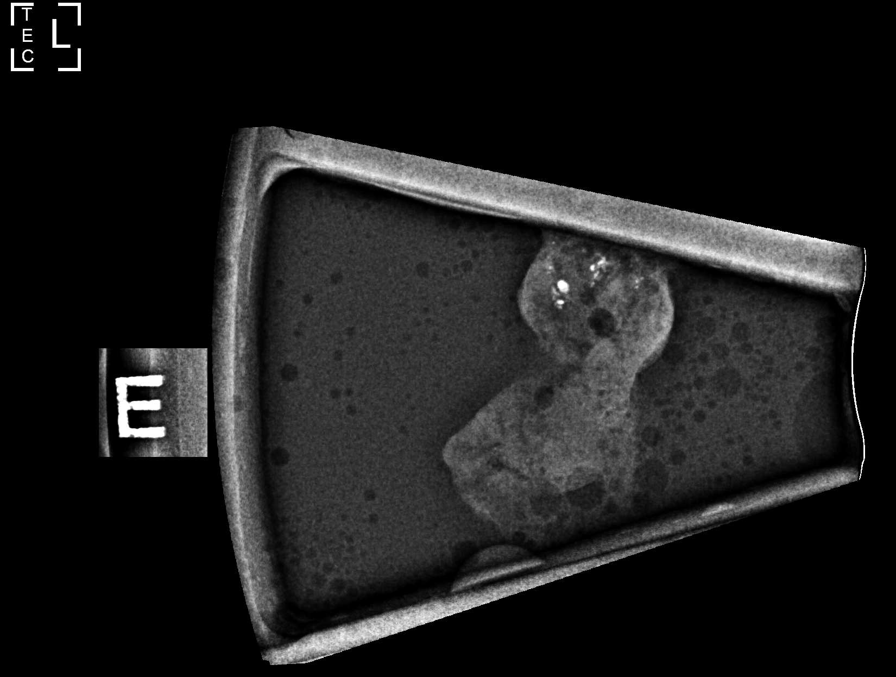

[L (3 of 6)]
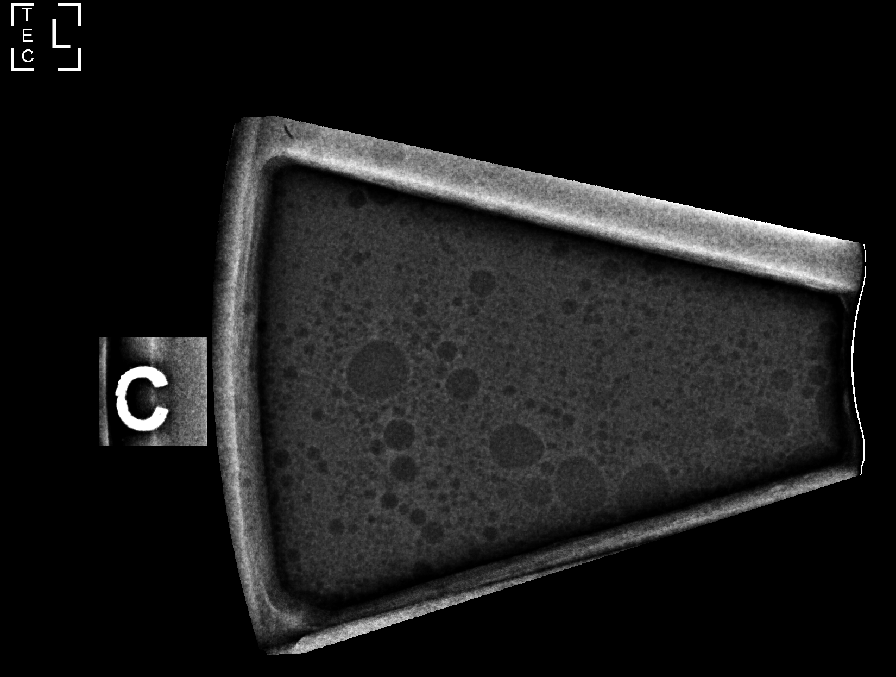

[L (4 of 6)]
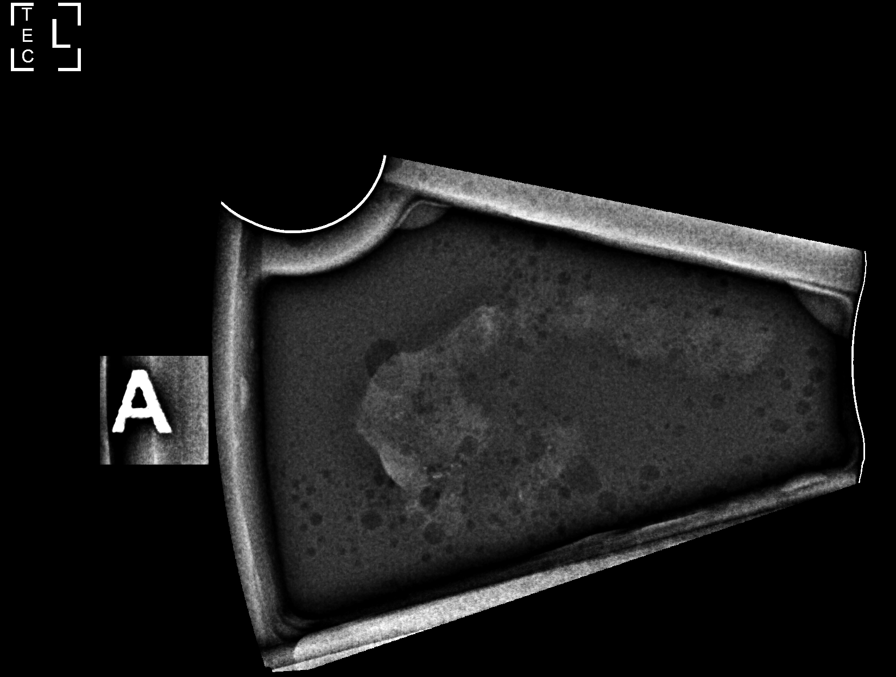

[L (5 of 6)]
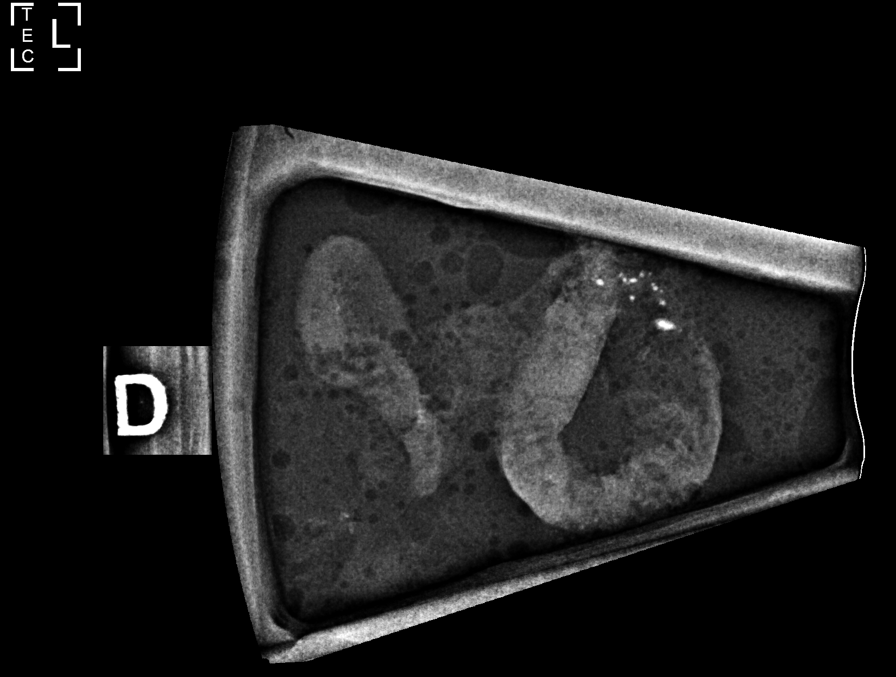

[L (6 of 6)]
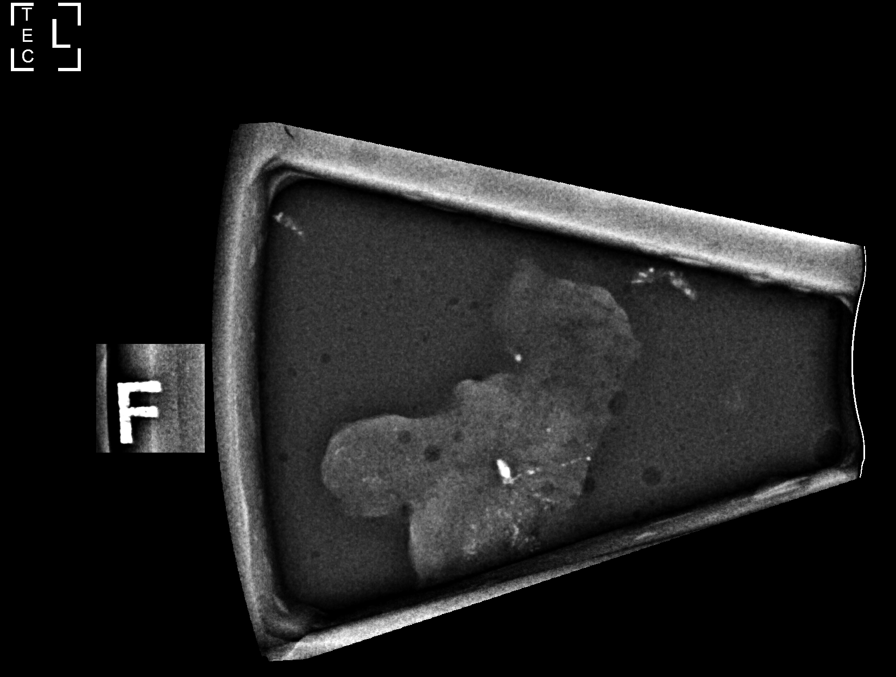

[L CC]
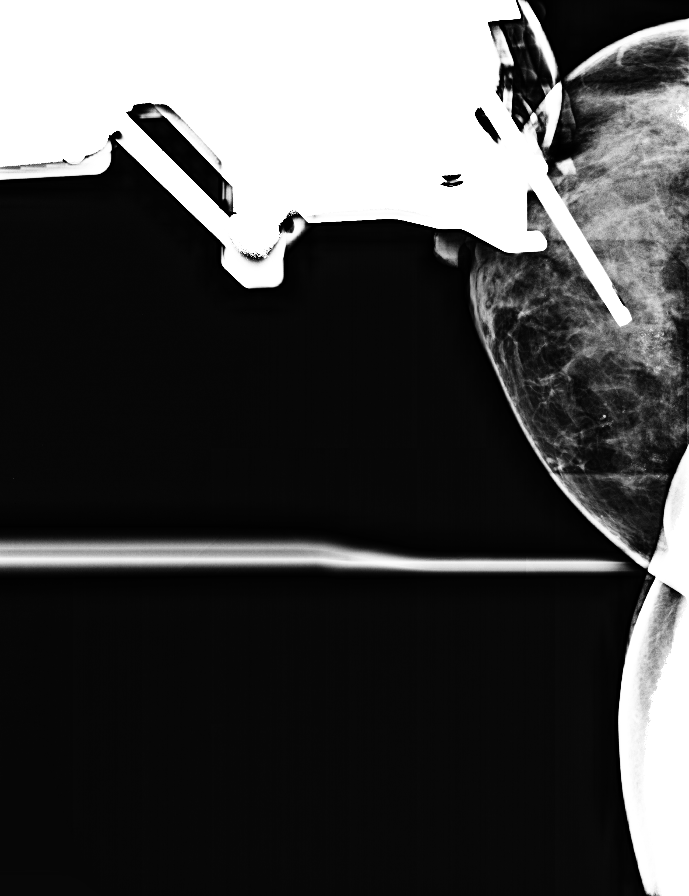

[7 of 14 positions shown; findings below may reference images not displayed]



Using sterile technique and 1% Lidocaine as local anesthetic, under
stereotactic guidance, a 9 gauge vacuum assisted device was used to
perform core needle biopsy of calcifications in the upper-outer
quadrant of the left breast using a superior approach. Specimen
radiograph was performed showing multiple calcifications. Specimens
with calcifications are identified for pathology.

Lesion quadrant: Upper outer quadrant

At the conclusion of the procedure, coil tissue marker clip was
deployed into the biopsy cavity. Follow-up 2-view mammogram was
performed and dictated separately.
IMPRESSION: Stereotactic-guided biopsy of the left breast. No apparent
complications.

ADDENDUM:
Pathology revealed LOW GRADE DUCTAL CARCINOMA IN SITU WITH
CALCIFICATIONS, COMPLEX SCLEROSING LESION of the LEFT breast, upper
outer quadrant. This was found to be concordant by Dr. ARCH,
with excision recommended.

Pathology results were discussed with the patient by telephone. The
patient reported doing well after the biopsy with tenderness at the
site. Post biopsy instructions and care were reviewed and questions
were answered. The patient was encouraged to call The [REDACTED]

Recommendation is for 2nd stereotactic biopsy of the inferior most
calcifications (Lower outer quadrant). This is scheduled for [DATE] at [DATE]. Additionally, bilateral breast MRI is
suggested.

Surgical consultation has been arranged with Dr. ARCH at
[REDACTED] on [DATE].

Pathology results reported by ARCH RN on [DATE].

ADDENDUM:
The patient was referred to [REDACTED]
[REDACTED] at [REDACTED] on
[DATE], per her request.

ARCH RN on [DATE].

*** End of Addendum ***
Addendum:
FINDINGS: The patient and I discussed the procedure of stereotactic-guided
biopsy including benefits and alternatives. We discussed the high
likelihood of a successful procedure. We discussed the risks of the
procedure including infection, bleeding, tissue injury, clip
migration, and inadequate sampling. Informed written consent was
given. The usual time out protocol was performed immediately prior
to the procedure.

Using sterile technique and 1% Lidocaine as local anesthetic, under
stereotactic guidance, a 9 gauge vacuum assisted device was used to
perform core needle biopsy of calcifications in the upper-outer
quadrant of the left breast using a superior approach. Specimen
radiograph was performed showing multiple calcifications. Specimens
with calcifications are identified for pathology.

Lesion quadrant: Upper outer quadrant

At the conclusion of the procedure, coil tissue marker clip was
deployed into the biopsy cavity. Follow-up 2-view mammogram was
performed and dictated separately.
IMPRESSION: Stereotactic-guided biopsy of the left breast. No apparent
complications.

ADDENDUM:
Pathology revealed LOW GRADE DUCTAL CARCINOMA IN SITU WITH
CALCIFICATIONS, COMPLEX SCLEROSING LESION of the LEFT breast, upper
outer quadrant. This was found to be concordant by Dr. ARCH,
with excision recommended.

Pathology results were discussed with the patient by telephone. The
patient reported doing well after the biopsy with tenderness at the
site. Post biopsy instructions and care were reviewed and questions
were answered. The patient was encouraged to call The [REDACTED]

Recommendation is for 2nd stereotactic biopsy of the inferior most
calcifications (Lower outer quadrant). This is scheduled for [DATE] at [DATE]. Additionally, bilateral breast MRI is
suggested.

Surgical consultation has been arranged with Dr. ARCH at
[REDACTED] on [DATE].

Pathology results reported by ARCH RN on [DATE].



Using sterile technique and 1% Lidocaine as local anesthetic, under
stereotactic guidance, a 9 gauge vacuum assisted device was used to
perform core needle biopsy of calcifications in the upper-outer
quadrant of the left breast using a superior approach. Specimen
radiograph was performed showing multiple calcifications. Specimens
with calcifications are identified for pathology.

Lesion quadrant: Upper outer quadrant

At the conclusion of the procedure, coil tissue marker clip was
deployed into the biopsy cavity. Follow-up 2-view mammogram was
performed and dictated separately.
IMPRESSION: Stereotactic-guided biopsy of the left breast. No apparent
complications.

## 2020-01-30 ENCOUNTER — Other Ambulatory Visit: Payer: Self-pay | Admitting: Obstetrics & Gynecology

## 2020-01-30 DIAGNOSIS — R921 Mammographic calcification found on diagnostic imaging of breast: Secondary | ICD-10-CM

## 2020-02-05 ENCOUNTER — Other Ambulatory Visit: Payer: Self-pay | Admitting: Obstetrics & Gynecology

## 2020-02-06 ENCOUNTER — Ambulatory Visit
Admission: RE | Admit: 2020-02-06 | Discharge: 2020-02-06 | Disposition: A | Discharge status: Home / Self Care | Payer: No Typology Code available for payment source | Source: Ambulatory Visit | Attending: Obstetrics & Gynecology | Admitting: Obstetrics & Gynecology

## 2020-02-06 ENCOUNTER — Other Ambulatory Visit: Payer: Self-pay

## 2020-02-06 DIAGNOSIS — R921 Mammographic calcification found on diagnostic imaging of breast: Secondary | ICD-10-CM

## 2020-02-06 HISTORY — PX: BREAST BIOPSY: SHX20

## 2020-02-06 IMAGING — MG MM BREAST BX W LOC DEV 1ST LESION IMAGE BX SPEC STEREO GUIDE*L*
8 of 10 series · 8 of 18 positions shown · non-contrast
Comparison: Previous exams.
COMPARISON: Previous exams.

Addendum:
CLINICAL DATA: 43-year-old female with newly diagnosed left breast
DCIS. Patient presents for biopsy of an additional group of
calcifications to approve extent of disease.

EXAM:
LEFT BREAST STEREOTACTIC CORE NEEDLE BIOPSY

[L (1 of 7)]
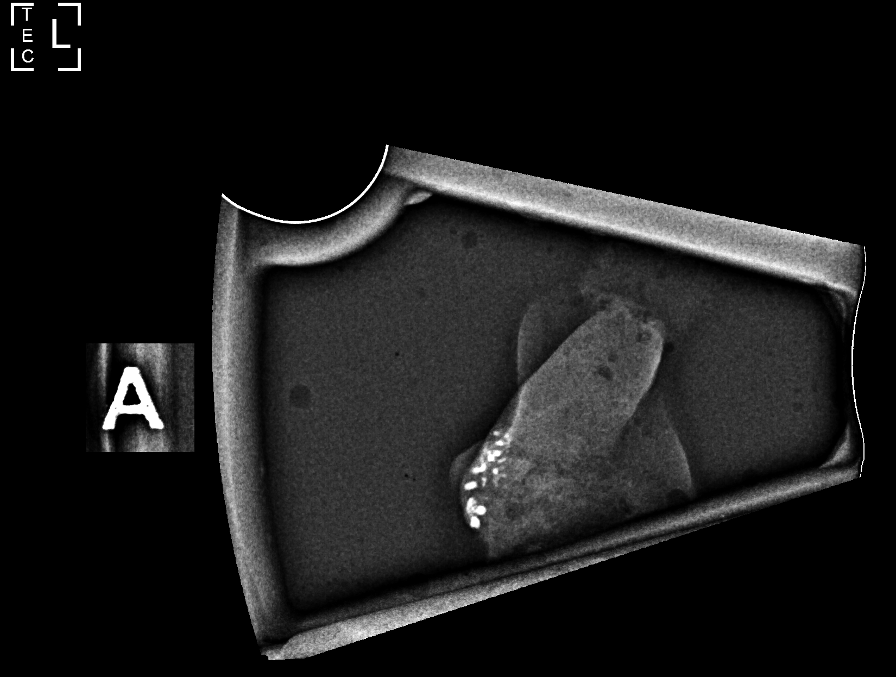

[L (2 of 7)]
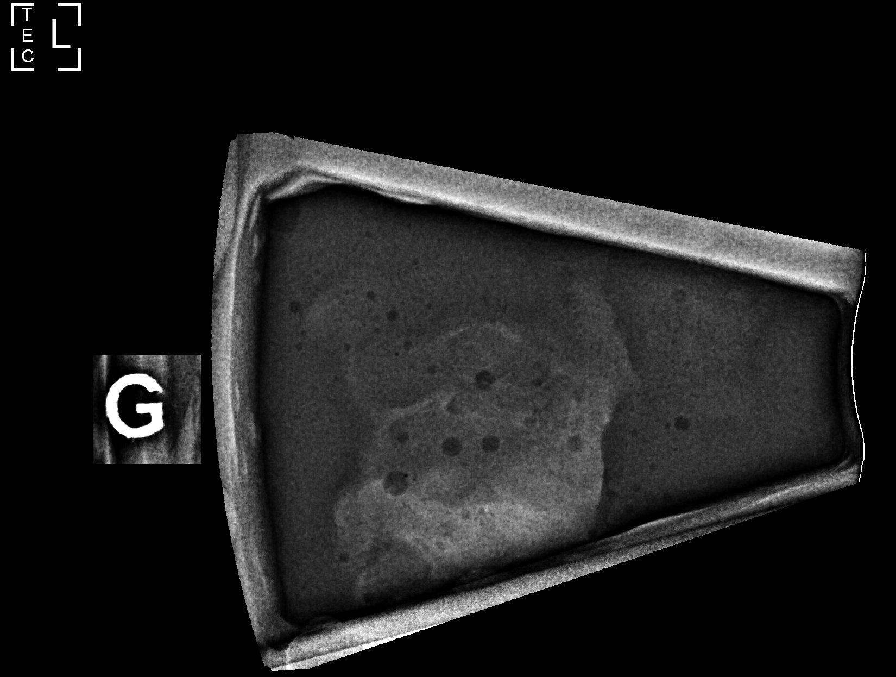

[L (3 of 7)]
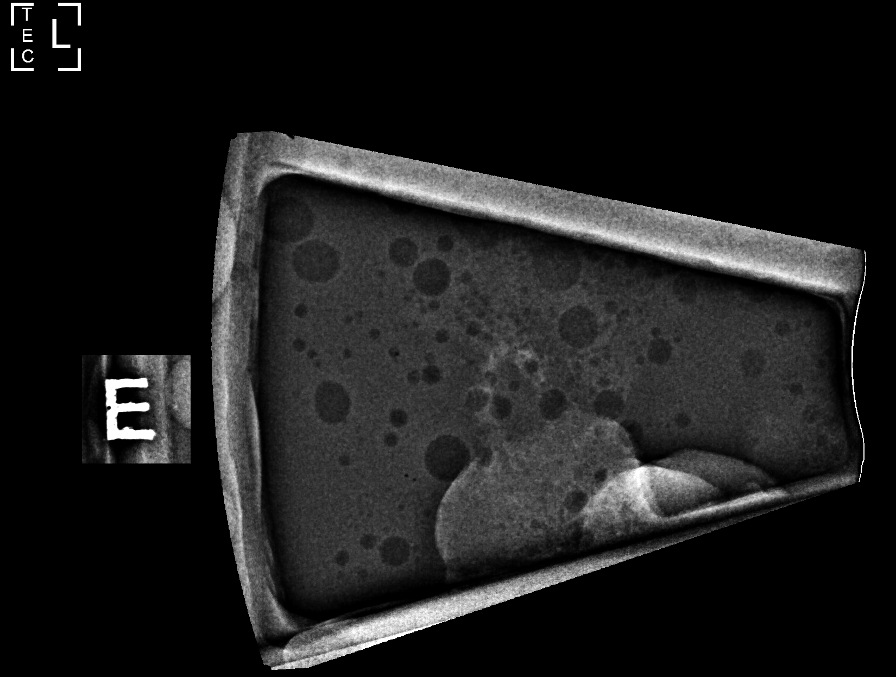

[L (4 of 7)]
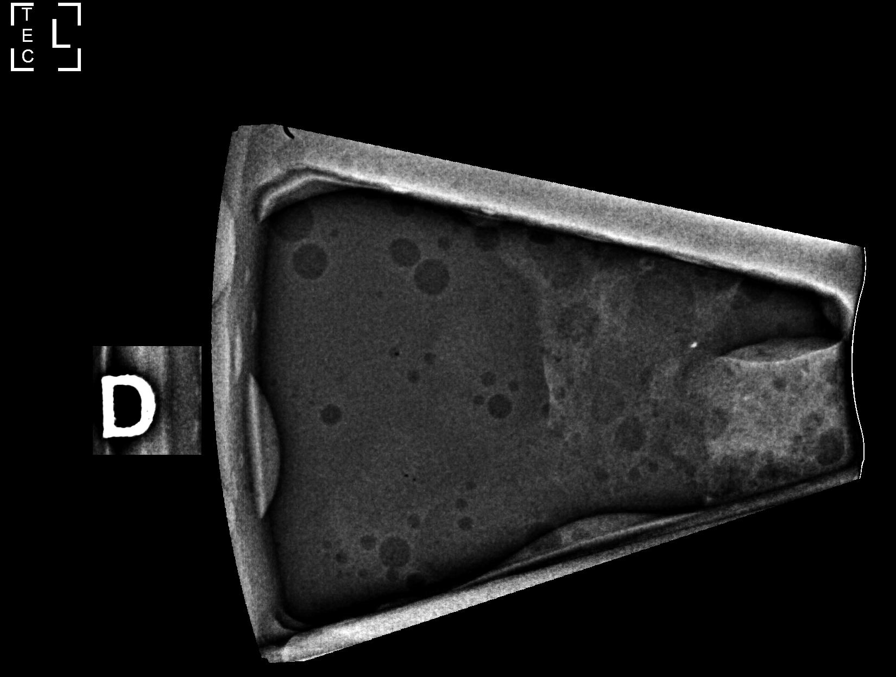

[L (5 of 7)]
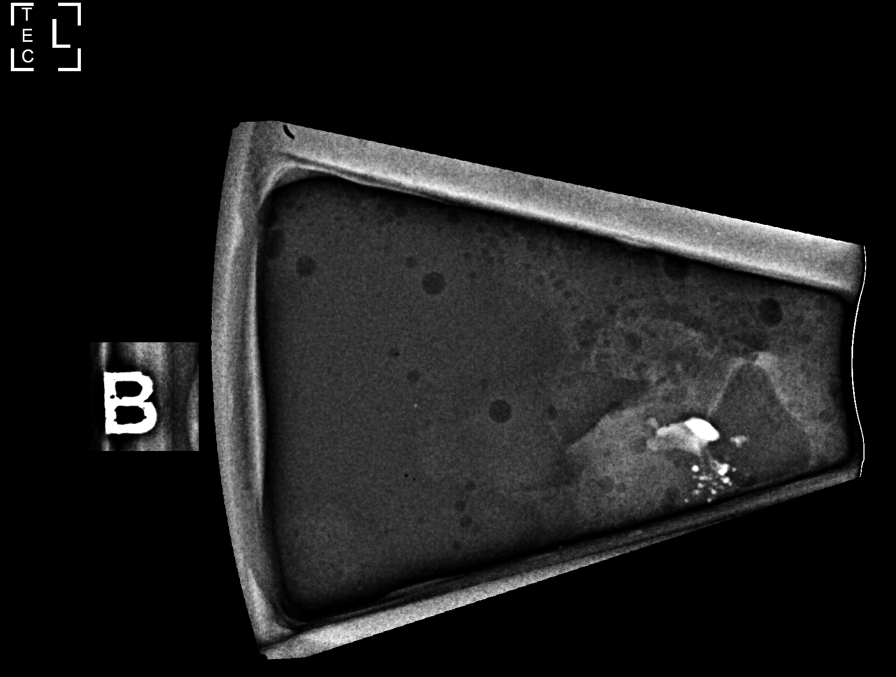

[L (6 of 7)]
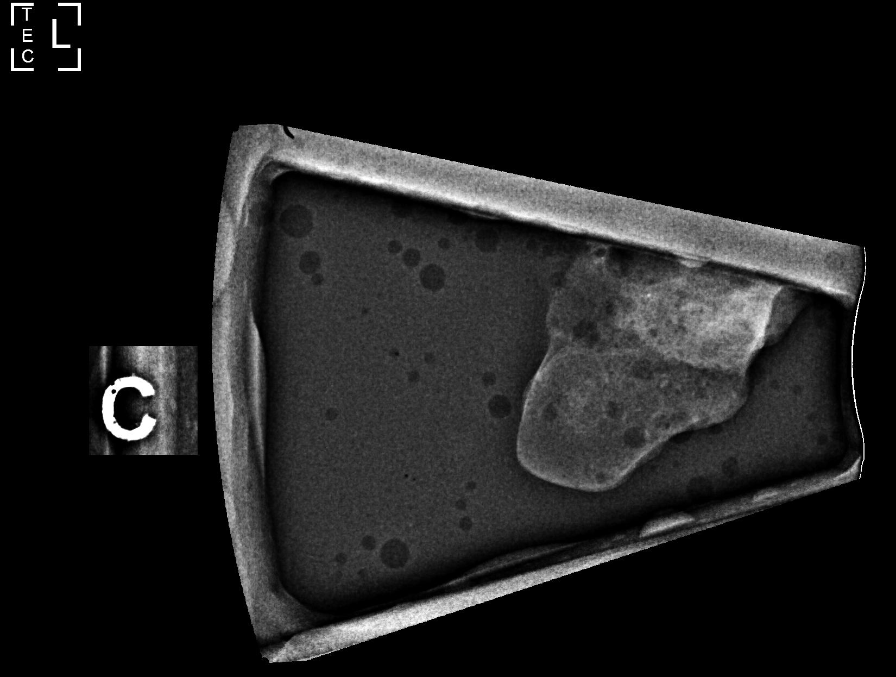

[L (7 of 7)]
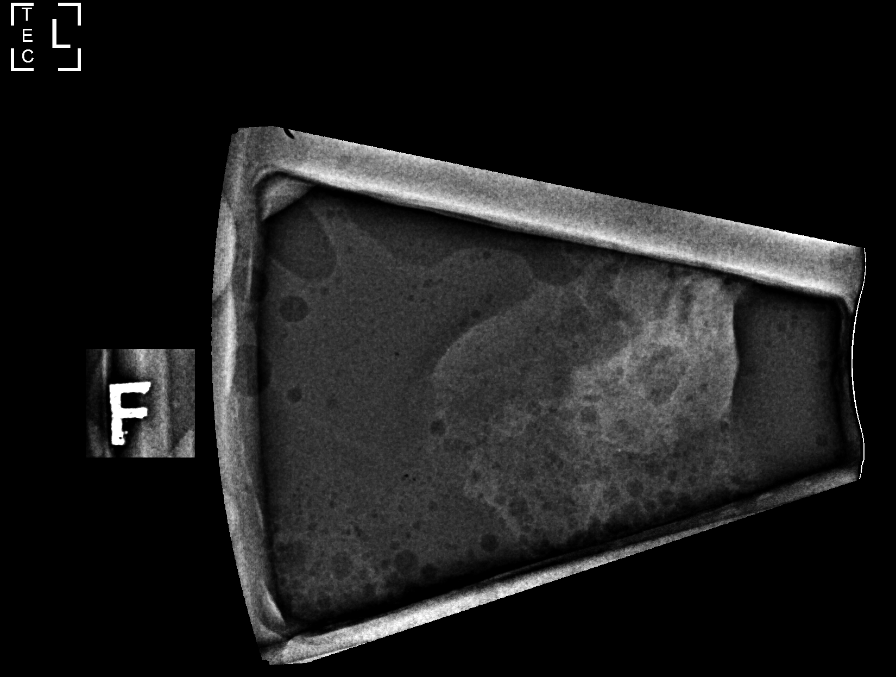

[L LM]
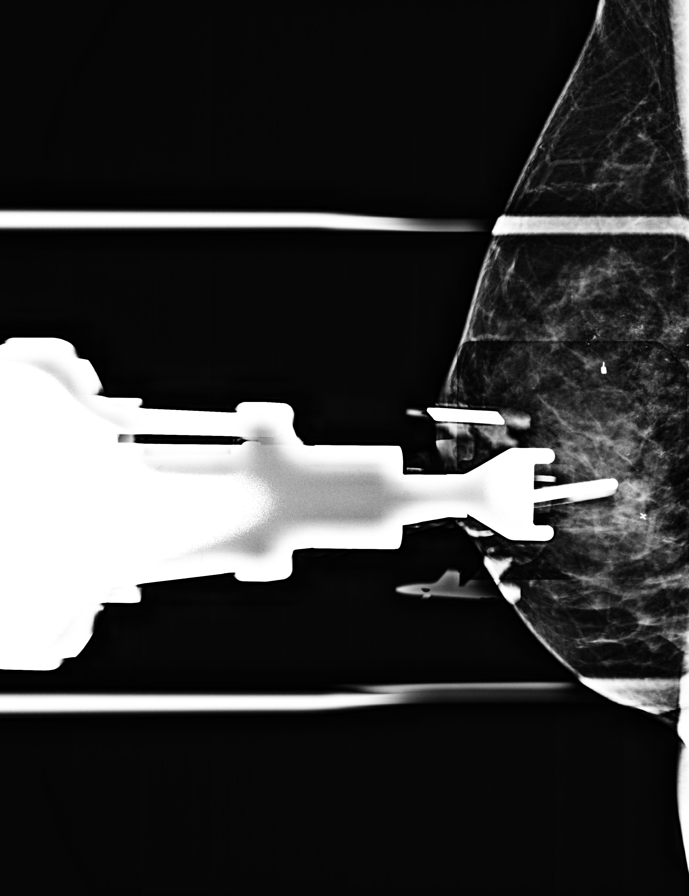

[8 of 18 positions shown; findings below may reference images not displayed]



Using sterile technique and 1% Lidocaine as local anesthetic, under
stereotactic guidance, a 9 gauge vacuum assisted device was used to
perform core needle biopsy of calcifications in the outer left
breast using a lateral approach. Specimen radiograph was performed
showing at least 2 specimens with calcifications. Specimens with
calcifications are identified for pathology.

Lesion quadrant: Upper outer quadrant

At the conclusion of the procedure, an X tissue marker clip was
deployed into the biopsy cavity. Follow-up 2-view mammogram was
performed and dictated separately.
IMPRESSION: Stereotactic-guided biopsy of calcifications in the outer left
breast. There is a small post biopsy hematoma.

ADDENDUM:
Pathology revealed FLAT EPITHELIAL ATYPIA WITH CALCIFICATIONS of the
LEFT breast, lateral. This was found to be concordant by Dr. SHUVO
SHUVO, with excision recommended.

Pathology results were discussed with the patient by telephone. The
patient reported doing well after the biopsy with tenderness at the
site. Post biopsy instructions and care were reviewed and questions
were answered. The patient was encouraged to call The [REDACTED]

The patient has a recent diagnosis of LEFT breast cancer and per her
request, she was referred to [REDACTED]
[REDACTED] at [REDACTED] on
[DATE].

Breast MRI is recommended for extent of disease given calcifications
spanning at least 4 x 3 cm.

Pathology results reported by SHUVO RN on [DATE].



Using sterile technique and 1% Lidocaine as local anesthetic, under
stereotactic guidance, a 9 gauge vacuum assisted device was used to
perform core needle biopsy of calcifications in the outer left
breast using a lateral approach. Specimen radiograph was performed
showing at least 2 specimens with calcifications. Specimens with
calcifications are identified for pathology.

Lesion quadrant: Upper outer quadrant

At the conclusion of the procedure, an X tissue marker clip was
deployed into the biopsy cavity. Follow-up 2-view mammogram was
performed and dictated separately.
IMPRESSION: Stereotactic-guided biopsy of calcifications in the outer left
breast. There is a small post biopsy hematoma.

## 2020-02-06 IMAGING — MG MM BREAST LOCALIZATION CLIP
4 series · 4 of 12 positions shown · non-contrast
Comparison: Previous exam(s).

CLINICAL DATA: 43-year-old female with newly diagnosed left breast
DCIS. Patient presents for biopsy of an additional group of
calcifications to prove extent of disease.

EXAM:
DIAGNOSTIC LEFT MAMMOGRAM POST STEREOTACTIC BIOPSY

[L ML synth-2D]
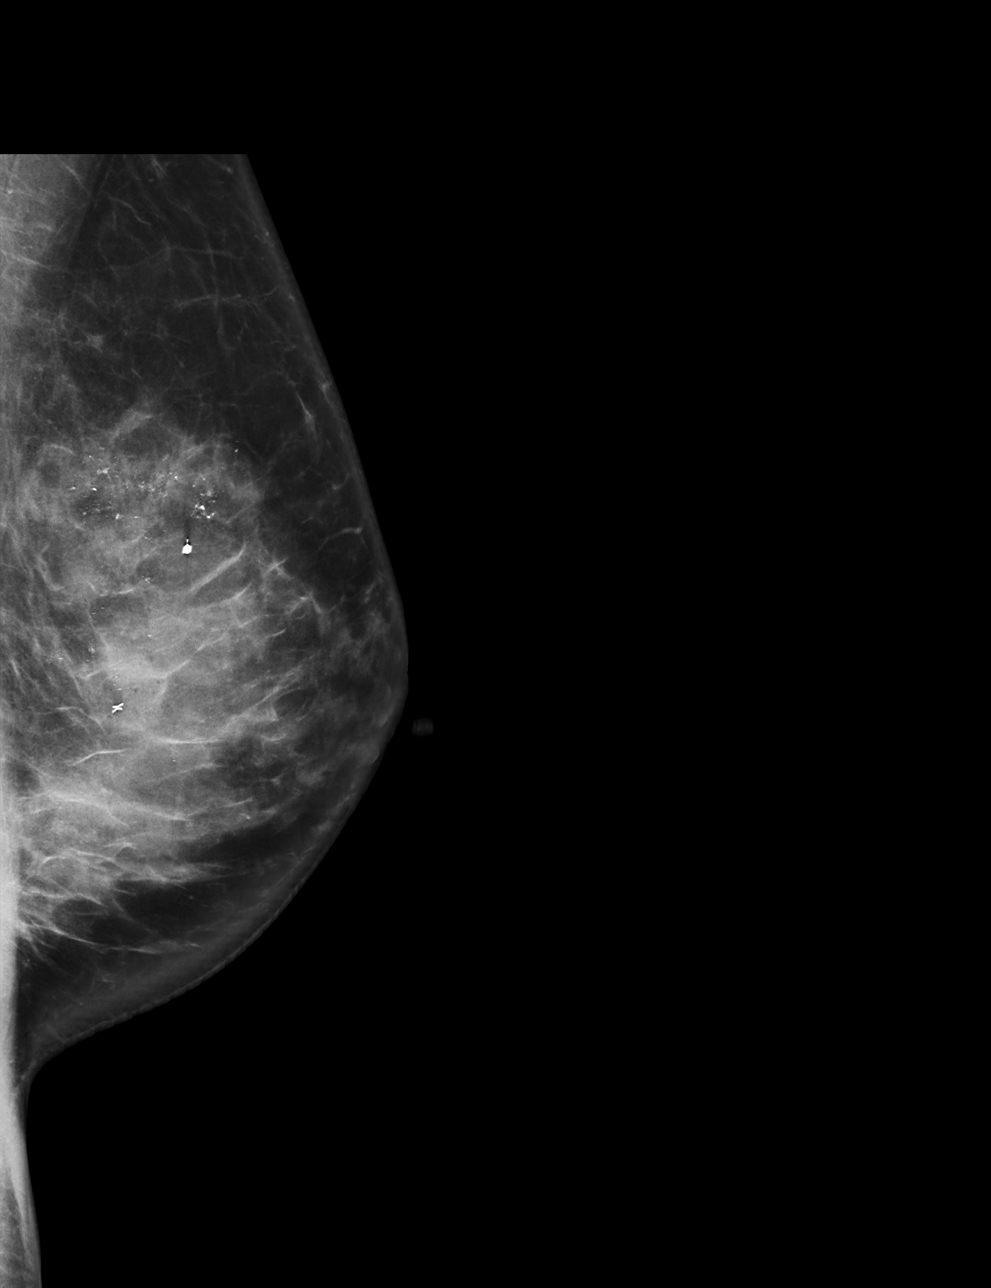

[L CC synth-2D]
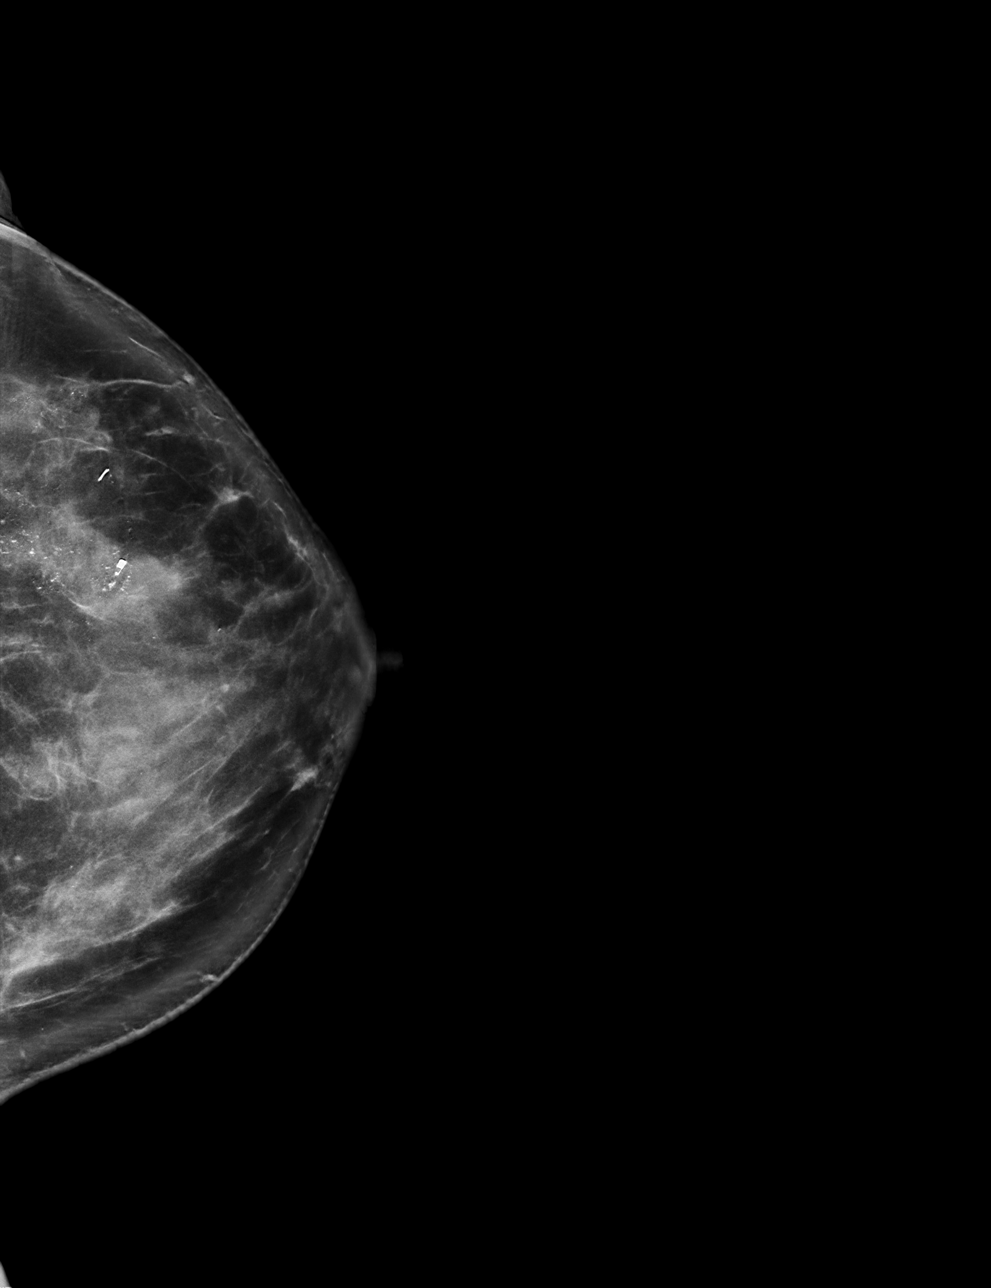

[L ML tomo · tomo slice 39/78.0]
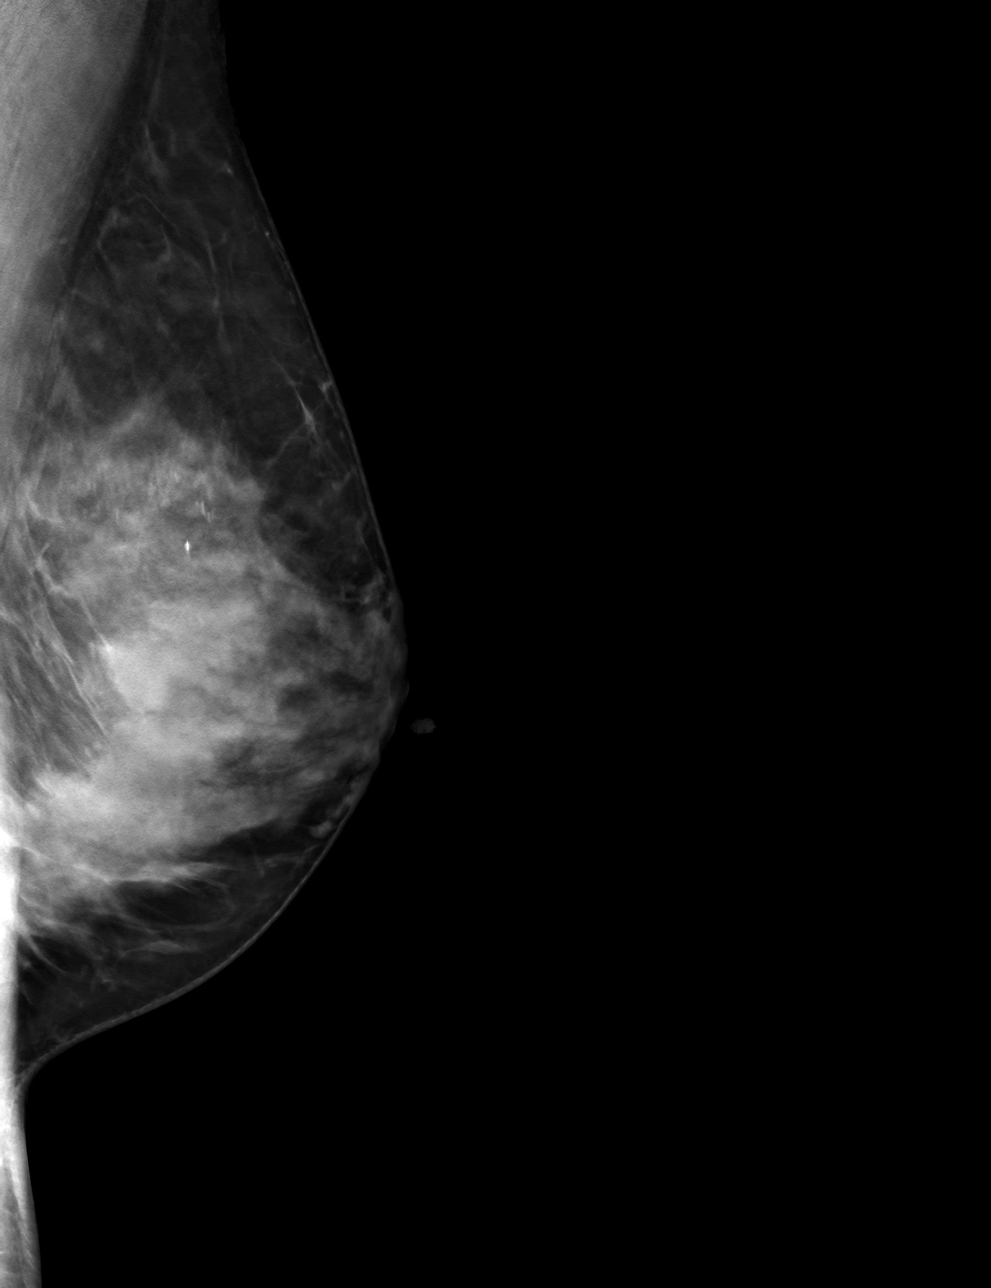

[L CC tomo · tomo slice 39/78.0]
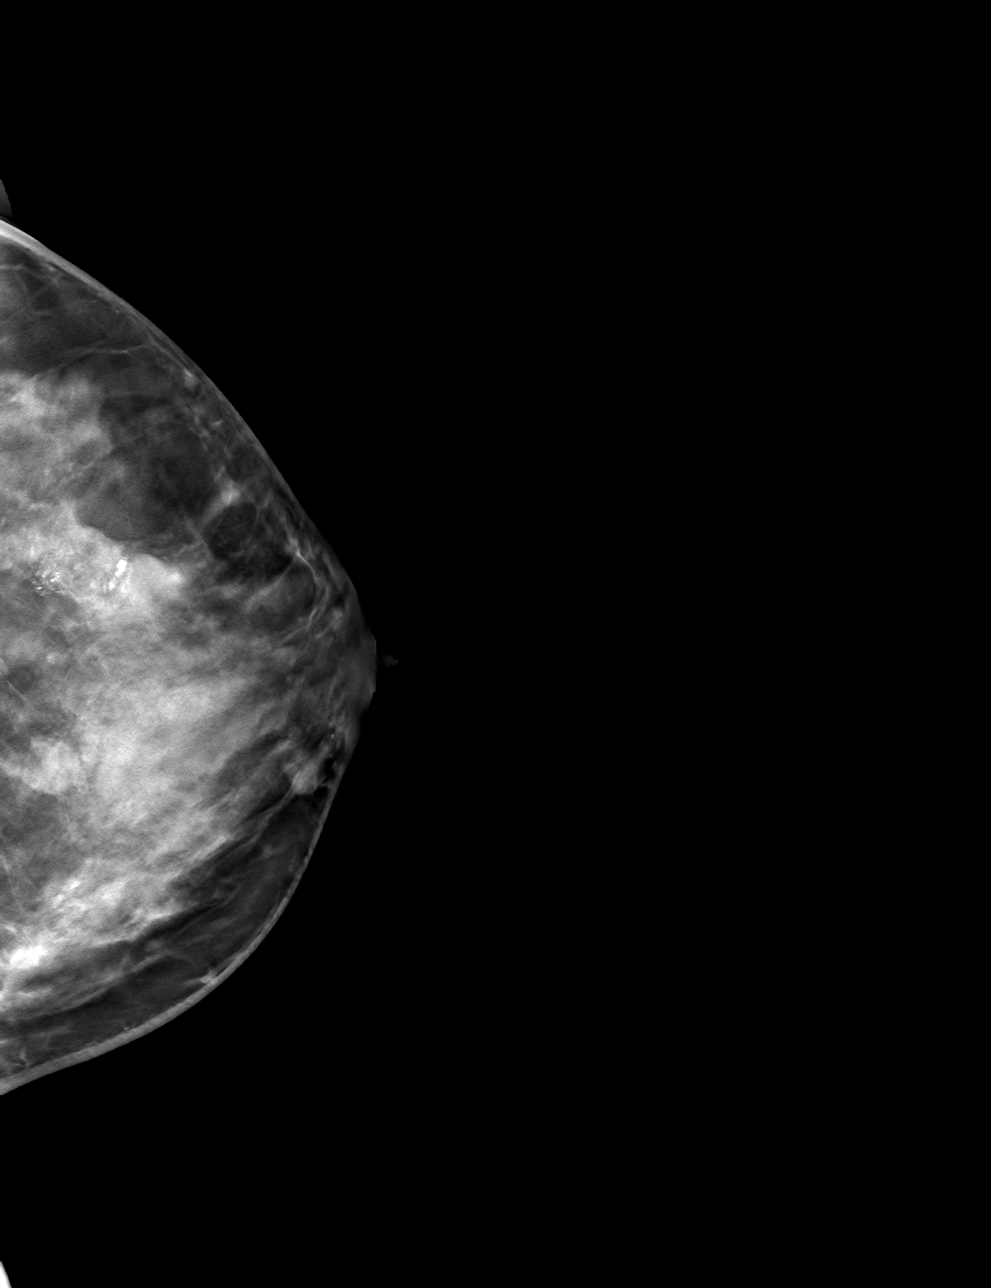

[4 of 12 positions shown; findings below may reference images not displayed]

FINDINGS: Mammographic images were obtained following stereotactic guided
biopsy of calcifications in the outer left breast. The X biopsy
marking clip is in expected position at the site of biopsy.
IMPRESSION: Appropriate positioning of the X shaped biopsy marking clip at the
site of biopsy in the outer left breast.

Final Assessment: Post Procedure Mammograms for Marker Placement

## 2020-02-08 ENCOUNTER — Encounter: Payer: Self-pay | Admitting: *Deleted

## 2020-02-08 DIAGNOSIS — D0512 Intraductal carcinoma in situ of left breast: Secondary | ICD-10-CM

## 2020-02-12 ENCOUNTER — Other Ambulatory Visit: Payer: Self-pay

## 2020-02-13 ENCOUNTER — Ambulatory Visit (INDEPENDENT_AMBULATORY_CARE_PROVIDER_SITE_OTHER): Payer: No Typology Code available for payment source | Admitting: Family Medicine

## 2020-02-13 ENCOUNTER — Encounter: Payer: Self-pay | Admitting: Family Medicine

## 2020-02-13 VITALS — BP 122/68 | HR 65 | Temp 97.8°F | Ht 64.0 in | Wt 137.4 lb

## 2020-02-13 DIAGNOSIS — D0512 Intraductal carcinoma in situ of left breast: Secondary | ICD-10-CM | POA: Diagnosis not present

## 2020-02-13 DIAGNOSIS — I10 Essential (primary) hypertension: Secondary | ICD-10-CM

## 2020-02-13 MED ORDER — VERAPAMIL HCL ER 180 MG PO TBCR: 180.0000 mg | EXTENDED_RELEASE_TABLET | Freq: Every day | ORAL | 0 refills | Status: DC

## 2020-02-13 MED FILL — VERAPAMIL HCL ER 180 MG TBC: 180 | 90 days supply | Qty: 90 | Fill #0

## 2020-02-13 NOTE — Progress Notes (Signed)
[place] NOTE  Patient Care Team: Libby Maw, MD as PCP - General (Family Medicine) Mauro Kaufmann, RN as Oncology Nurse Navigator Rockwell Germany, RN as Registered Nurse Rolm Bookbinder, MD as Consulting Physician (General Surgery) Nicholas Lose, MD as Consulting Physician (Hematology and Oncology) Kyung Rudd, MD as Consulting Physician (Radiation Oncology)  CHIEF COMPLAINTS/PURPOSE OF CONSULTATION:  Newly diagnosed left breast DCIS  HISTORY OF PRESENTING ILLNESS:  Victoria Maddox 44 y.o. female is here because of recent diagnosis of ductal carcinoma in situ of the left breast. Screening mammogram on 01/11/20 showed left breast calcifications. Diagnostic mammogram on 01/13/20 showed calcifications in the left breast, 2.9cm. Biopsy on 01/29/20 showed ductal carcinoma in situ with calcifications, low grade, ER+ 70%, PR+ 90%. She presents to the clinic today for initial evaluation and discussion of treatment options.   I reviewed her records extensively and collaborated the history with the patient.  SUMMARY OF ONCOLOGIC HISTORY: Oncology History  Ductal carcinoma in situ (DCIS) of left breast  02/08/2020 Initial Diagnosis   Screening mammogram detected left breast calcifications, 2.9cm. Biopsy showed DCIS with calcifications, low grade, ER+ 70%, PR+ 90%.     MEDICAL HISTORY:  Past Medical History:  Diagnosis Date  . Allergy   . Depression   . Frequent headaches   . Heart murmur   . History of recurrent UTIs   . Hypertension   . Migraines   . Urinary incontinence     SURGICAL HISTORY: Past Surgical History:  Procedure Laterality Date  . DILATION AND CURETTAGE OF UTERUS      SOCIAL HISTORY: Social History   Socioeconomic History  . Marital status: Married    Spouse name: Not on file  . Number of children: Not on file  . Years of education: Not on file  . Highest education level: Not on file  Occupational History  . Not on file   Tobacco Use  . Smoking status: Never Smoker  . Smokeless tobacco: Never Used  Substance and Sexual Activity  . Alcohol use: Not Currently  . Drug use: Never  . Sexual activity: Yes    Partners: Male  Other Topics Concern  . Not on file  Social History Narrative  . Not on file   Social Determinants of Health   Financial Resource Strain:   . Difficulty of Paying Living Expenses:   Food Insecurity:   . Worried About Charity fundraiser in the Last Year:   . Arboriculturist in the Last Year:   Transportation Needs:   . Film/video editor (Medical):   Marland Kitchen Lack of Transportation (Non-Medical):   Physical Activity:   . Days of Exercise per Week:   . Minutes of Exercise per Session:   Stress:   . Feeling of Stress :   Social Connections:   . Frequency of Communication with Friends and Family:   . Frequency of Social Gatherings with Friends and Family:   . Attends Religious Services:   . Active Member of Clubs or Organizations:   . Attends Archivist Meetings:   Marland Kitchen Marital Status:   Intimate Partner Violence:   . Fear of Current or Ex-Partner:   . Emotionally Abused:   Marland Kitchen Physically Abused:   . Sexually Abused:     FAMILY HISTORY: Family History  Problem Relation Age of Onset  . Heart attack Mother   . Heart disease Mother   . Hypertension Mother   . Early death  Father   . Hypertension Father   . Hypertension Sister   . Diabetes Brother   . Hypertension Brother   . Asthma Sister   . Hypertension Sister   . Hyperlipidemia Brother   . Hypertension Brother   . Mental illness Brother   . Heart disease Sister   . Hypertension Sister   . Arthritis Brother   . Diabetes Brother   . Hypertension Brother   . Breast cancer Cousin     ALLERGIES:  is allergic to trazodone and nefazodone and zoloft [sertraline hcl].  MEDICATIONS:  Current Outpatient Medications  Medication Sig Dispense Refill  . acetaminophen (TYLENOL) 325 MG tablet Take 650 mg by mouth  every 6 (six) hours as needed.    . diphenhydrAMINE (BENADRYL) 25 mg capsule Take 25 mg by mouth at bedtime as needed.    . hydrochlorothiazide (HYDRODIURIL) 25 MG tablet TAKE 1 TABLET (25 MG TOTAL) BY MOUTH DAILY. 90 tablet 1  . verapamil (CALAN SR) 180 MG CR tablet Take 1 tablet (180 mg total) by mouth at bedtime. 90 tablet 0  . ondansetron (ZOFRAN) 4 MG tablet Take 1 tablet (4 mg total) by mouth every 8 (eight) hours as needed for nausea or vomiting. (Patient not taking: Reported on 02/13/2020) 20 tablet 0   No current facility-administered medications for this visit.    REVIEW OF SYSTEMS:   Constitutional: Denies fevers, chills or abnormal night sweats Eyes: Denies blurriness of vision, double vision or watery eyes Ears, nose, mouth, throat, and face: Denies mucositis or sore throat Respiratory: Denies cough, dyspnea or wheezes Cardiovascular: Denies palpitation, chest discomfort or lower extremity swelling Gastrointestinal:  Denies nausea, heartburn or change in bowel habits Skin: Denies abnormal skin rashes Lymphatics: Denies new lymphadenopathy or easy bruising Neurological:Denies numbness, tingling or new weaknesses Behavioral/Psych: Mood is stable, no new changes  Breast: Denies any palpable lumps or discharge All other systems were reviewed with the patient and are negative.  PHYSICAL EXAMINATION: ECOG PERFORMANCE STATUS: 1 - Symptomatic but completely ambulatory  Vitals:   02/14/20 0904  BP: (!) 147/72  Pulse: 70  Resp: 20  Temp: 98.3 F (36.8 C)  SpO2: 100%   Filed Weights   02/14/20 0904  Weight: 141 lb 3.2 oz (64 kg)    GENERAL:alert, no distress and comfortable SKIN: skin color, texture, turgor are normal, no rashes or significant lesions EYES: normal, conjunctiva are pink and non-injected, sclera clear OROPHARYNX:no exudate, no erythema and lips, buccal mucosa, and tongue normal  NECK: supple, thyroid normal size, non-tender, without nodularity LYMPH:  no  palpable lymphadenopathy in the cervical, axillary or inguinal LUNGS: clear to auscultation and percussion with normal breathing effort HEART: regular rate & rhythm and no murmurs and no lower extremity edema ABDOMEN:abdomen soft, non-tender and normal bowel sounds Musculoskeletal:no cyanosis of digits and no clubbing  PSYCH: alert & oriented x 3 with fluent speech NEURO: no focal motor/sensory deficits BREAST: No palpable nodules in breast. No palpable axillary or supraclavicular lymphadenopathy (exam performed in the presence of a chaperone)   LABORATORY DATA:  I have reviewed the data as listed Lab Results  Component Value Date   WBC 5.1 02/14/2020   HGB 11.7 (L) 02/14/2020   HCT 36.1 02/14/2020   MCV 82.8 02/14/2020   PLT 308 02/14/2020   Lab Results  Component Value Date   NA 140 02/14/2020   K 3.8 02/14/2020   CL 106 02/14/2020   CO2 27 02/14/2020    RADIOGRAPHIC STUDIES: I have  personally reviewed the radiological reports and agreed with the findings in the report.  ASSESSMENT AND PLAN:  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 Biopsy showed DCIS with calcifications, low grade, ER+ 70%, PR+ 90%.  Additional biopsy: Flat epithelial atypia which needs excision Tis NX stage 0  Pathology review: I discussed with the patient the difference between DCIS and invasive breast cancer. It is considered a precancerous lesion. DCIS is classified as a 0. It is generally detected through mammograms as calcifications. We discussed the significance of grades and its impact on prognosis. We also discussed the importance of ER and PR receptors and their implications to adjuvant treatment options. Prognosis of DCIS dependence on grade, comedo necrosis. It is anticipated that if not treated, 20-30% of DCIS can develop into invasive breast cancer.  Recommendation: 1. Breast conserving surgery 2. Followed by  adjuvant radiation therapy 3. Followed by antiestrogen therapy with tamoxifen 5 years  Tamoxifen counseling: We discussed the risks and benefits of tamoxifen. These include but not limited to insomnia, hot flashes, mood changes, vaginal dryness, and weight gain. Although rare, serious side effects including endometrial cancer, risk of blood clots were also discussed. We strongly believe that the benefits far outweigh the risks. Patient understands these risks and consented to starting treatment. Planned treatment duration is 5 years.  Return to clinic after surgery to discuss the final pathology report and come up with an adjuvant treatment plan.     All questions were answered. The patient knows to call the clinic with any problems, questions or concerns.   Rulon Eisenmenger, MD, MPH 02/14/2020    I, Molly Dorshimer, am acting as scribe for Nicholas Lose, MD.  I have reviewed the above documentation for accuracy and completeness, and I agree with the above.

## 2020-02-13 NOTE — Progress Notes (Signed)
Radiation Oncology         [phone] ________________________________  Name: Victoria Maddox        MRN: [id number]  Date of Service: 02/14/2020 DOB: 02-05-1976  [id number], Mortimer Fries, MD  Lucille Passy, MD     REFERRING PHYSICIAN: Lucille Passy, MD   DIAGNOSIS: The encounter diagnosis was Ductal carcinoma in situ (DCIS) of left breast.   HISTORY OF PRESENT ILLNESS: Victoria Maddox is a 44 y.o. female seen in the multidisciplinary breast clinic for a new diagnosis of left breast cancer. The patient was noted to have screening detected calcifications in the left breast which led to further diagnostic imaging. Diagnostic left mammogram on 01/23/2020 revealed a grouping of calcifications in the left breast in the upper outer quadrant measuring approximately 2.9 cm in greatest extent and a stereotactic biopsy was performed on 01/29/2020 revealing an ER/PR positive low-grade DCIS with calcifications in the setting of a complex sclerosing lesion.  A second biopsy of calcifications also seen on 02/06/2020 more lateral to the initial site were consistent with flat epithelial atypia with calcifications.  She is seen today to discuss treatment recommendations of her cancer.   PREVIOUS RADIATION THERAPY: No   PAST MEDICAL HISTORY:  Past Medical History:  Diagnosis Date  . Allergy   . Depression   . Frequent headaches   . Heart murmur   . History of recurrent UTIs   . Hypertension   . Migraines   . Urinary incontinence        PAST SURGICAL HISTORY:No past surgical history on file.   FAMILY HISTORY:  Family History  Problem Relation Age of Onset  . Heart attack Mother   . Heart disease Mother   . Hypertension Mother   . Early death Father   . Hypertension Father   . Hypertension Sister   . Diabetes Brother   . Hypertension Brother   . Asthma Sister   . Hypertension Sister   . Hyperlipidemia Brother   . Hypertension Brother   . Mental illness Brother   . Heart disease  Sister   . Hypertension Sister   . Arthritis Brother   . Diabetes Brother   . Hypertension Brother      SOCIAL HISTORY:  reports that she has never smoked. She has never used smokeless tobacco. She reports previous alcohol use. She reports that she does not use drugs.  The patient is married and lives in [place].  She is a Estate manager/land agent at [employer]. She is originally from [place]. Her husband works in [place] and comes back for short visits every few months. She has a brother in law who is helpful and lives in town. She has a 35 year old daughter and 67 year old twin boys.  ALLERGIES: Trazodone and nefazodone and Zoloft [sertraline hcl]   MEDICATIONS:  Current Outpatient Medications  Medication Sig Dispense Refill  . acetaminophen (TYLENOL) 325 MG tablet Take 650 mg by mouth every 6 (six) hours as needed.    . Albuterol Sulfate (PROAIR RESPICLICK) 123XX123 (90 Base) MCG/ACT AEPB Inhale 1-2 puffs into the lungs every 6 (six) hours as needed. For wheezing, SOB or cough (Patient not taking: Reported on 02/13/2020) 1 each 0  . diphenhydrAMINE (BENADRYL) 25 mg capsule Take 25 mg by mouth at bedtime as needed.    . hydrochlorothiazide (HYDRODIURIL) 25 MG tablet TAKE 1 TABLET (25 MG TOTAL) BY MOUTH DAILY. 90 tablet 1  . ondansetron (ZOFRAN) 4 MG tablet Take 1  tablet (4 mg total) by mouth every 8 (eight) hours as needed for nausea or vomiting. (Patient not taking: Reported on 02/13/2020) 20 tablet 0  . SUMAtriptan (IMITREX) 50 MG tablet 1 tab po x 1 dose, may repeat in 2hrs x 1 dose if needed (Patient not taking: Reported on 09/30/2019) 10 tablet 2  . verapamil (CALAN SR) 180 MG CR tablet Take 1 tablet (180 mg total) by mouth at bedtime. 90 tablet 0   No current facility-administered medications for this encounter.     REVIEW OF SYSTEMS: On review of systems, the patient reports that she is doing well overall. She denies any chest pain, shortness of breath, cough, fevers, chills, night  sweats, unintended weight changes. She denies any bowel or bladder disturbances, and denies abdominal pain, nausea or vomiting. She denies any new musculoskeletal or joint aches or pains. A complete review of systems is obtained and is otherwise negative.     PHYSICAL EXAM:  Wt Readings from Last 3 Encounters:  02/14/20 141 lb 3.2 oz (64 kg)  02/13/20 137 lb 6.4 oz (62.3 kg)  09/27/18 125 lb (56.7 kg)   Temp Readings from Last 3 Encounters:  02/14/20 98.3 F (36.8 C) (Temporal)  02/13/20 97.8 F (36.6 C) (Tympanic)  09/27/18 98.2 F (36.8 C)   BP Readings from Last 3 Encounters:  02/14/20 (!) 147/72  02/13/20 122/68  09/27/18 118/90   Pulse Readings from Last 3 Encounters:  02/14/20 70  02/13/20 65  09/27/18 93    In general this is a well appearing African female in no acute distress. She's alert and oriented x4 and appropriate throughout the examination. Cardiopulmonary assessment is negative for acute distress and she exhibits normal effort. Bilateral breast exam is deferred.    ECOG = 0  0 - Asymptomatic (Fully active, able to carry on all predisease activities without restriction)  1 - Symptomatic but completely ambulatory (Restricted in physically strenuous activity but ambulatory and able to carry out work of a light or sedentary nature. For example, light housework, office work)  2 - Symptomatic, <50% in bed during the day (Ambulatory and capable of all self care but unable to carry out any work activities. Up and about more than 50% of waking hours)  3 - Symptomatic, >50% in bed, but not bedbound (Capable of only limited self-care, confined to bed or chair 50% or more of waking hours)  4 - Bedbound (Completely disabled. Cannot carry on any self-care. Totally confined to bed or chair)  5 - Death   Eustace Pen MM, Creech RH, Tormey DC, et al. [phone]). "Toxicity and response criteria of the [facility] Group". [place] Oncol. 5 (6):  649-55    LABORATORY DATA:  Lab Results  Component Value Date   WBC 5.1 02/14/2020   HGB 11.7 (L) 02/14/2020   HCT 36.1 02/14/2020   MCV 82.8 02/14/2020   PLT 308 02/14/2020   Lab Results  Component Value Date   NA 136 05/04/2018   K 3.7 05/04/2018   CL 102 05/04/2018   CO2 28 05/04/2018   Lab Results  Component Value Date   ALT 15 05/04/2018   AST 14 05/04/2018   ALKPHOS 54 05/04/2018   BILITOT 0.3 05/04/2018      RADIOGRAPHY: MM Digital Diagnostic Unilat L  Result Date: 01/23/2020 CLINICAL DATA:  Patient returns today to evaluate LEFT breast calcifications identified on a recent baseline screening mammogram. EXAM: DIGITAL DIAGNOSTIC LEFT MAMMOGRAM WITH CAD COMPARISON:  Baseline  screening mammogram dated 01/11/2020. ACR Breast Density Category c: The breast tissue is heterogeneously dense, which may obscure small masses. FINDINGS: On today's additional diagnostic views, including magnification views, grouped punctate and coarse heterogeneous calcifications are confirmed within the upper-outer quadrant of the LEFT breast, measuring 2.9 cm extent, with a suspicious segmental distribution. There are additional scattered layering calcifications within the central LEFT breast suggesting benign milk of calcium. Mammographic images were processed with CAD. IMPRESSION: Grouped punctate and coarse heterogeneous calcifications within the upper-outer quadrant of the LEFT breast, measuring 2.9 cm extent, with a suspicious segmental distribution. Some additional calcifications within the central LEFT breast have a layering appearance suggesting benign milk of calcium. Recommend stereotactic biopsy for the calcifications in the upper LEFT breast to ensure benignity. RECOMMENDATION: 1. Stereotactic biopsy for the grouped punctate and coarse heterogeneous calcifications in the upper outer quadrant of the LEFT breast. 2. If benign pathology result, would then recommend follow-up LEFT breast diagnostic  mammogram in 6 months to ensure stability of the other calcifications in the LEFT breast. 3. If biopsy reveals malignancy, would recommend at least 2 additional stereotactic biopsies to determine extent of disease. Stereotactic biopsy is scheduled for March 22nd. I have discussed the findings and recommendations with the patient. If applicable, a reminder letter will be sent to the patient regarding the next appointment. BI-RADS CATEGORY  4: Suspicious. Electronically Signed   By: Franki Cabot M.D.   On: 01/23/2020 09:39   MM CLIP PLACEMENT LEFT  Result Date: 02/06/2020 CLINICAL DATA:  44 year old female with newly diagnosed left breast DCIS. Patient presents for biopsy of an additional group of calcifications to prove extent of disease. EXAM: DIAGNOSTIC LEFT MAMMOGRAM POST STEREOTACTIC BIOPSY COMPARISON:  Previous exam(s). FINDINGS: Mammographic images were obtained following stereotactic guided biopsy of calcifications in the outer left breast. The X biopsy marking clip is in expected position at the site of biopsy. IMPRESSION: Appropriate positioning of the X shaped biopsy marking clip at the site of biopsy in the outer left breast. Final Assessment: Post Procedure Mammograms for Marker Placement Electronically Signed   By: Audie Pinto M.D.   On: 02/06/2020 10:24   MM CLIP PLACEMENT LEFT  Result Date: 01/29/2020 CLINICAL DATA:  Stereotactic biopsy was performed of suspicious calcifications in the upper-outer quadrant of the left breast. EXAM: DIAGNOSTIC LEFT MAMMOGRAM POST STEREOTACTIC BIOPSY COMPARISON:  Previous exam(s). FINDINGS: Mammographic images were obtained following stereotactic guided biopsy of the left breast. The biopsy marking clip is in expected position at the site of biopsy. IMPRESSION: Appropriate positioning of the coil shaped biopsy marking clip at the site of biopsy in the upper outer quadrant. Final Assessment: Post Procedure Mammograms for Marker Placement Electronically  Signed   By: Curlene Dolphin M.D.   On: 01/29/2020 10:02   MM LT BREAST BX W LOC DEV 1ST LESION IMAGE BX SPEC STEREO GUIDE  Addendum Date: 02/08/2020   ADDENDUM REPORT: 02/07/2020 12:14 ADDENDUM: The patient was referred to [facility] on February 14, 2020, per her request. Stacie Acres RN on 02/07/2020. Electronically Signed   By: Curlene Dolphin M.D.   On: 02/07/2020 12:14   Addendum Date: 01/30/2020   ADDENDUM REPORT: 01/30/2020 14:12 ADDENDUM: Pathology revealed LOW GRADE DUCTAL CARCINOMA IN SITU WITH CALCIFICATIONS, COMPLEX SCLEROSING LESION of the LEFT breast, upper outer quadrant. This was found to be concordant by Dr. Curlene Dolphin, with excision recommended. Pathology results were discussed with the patient by telephone.  The patient reported doing well after the biopsy with tenderness at the site. Post biopsy instructions and care were reviewed and questions were answered. The patient was encouraged to call [facility] for any additional concerns. Recommendation is for 2nd stereotactic biopsy of the inferior most calcifications (Lower outer quadrant). This is scheduled for February 06, 2020 at 9:30 am. Additionally, bilateral breast MRI is suggested. Surgical consultation has been arranged with Dr. Autumn Messing at [facility] on February 14, 2020. Pathology results reported by Stacie Acres RN on 01/30/2020. Electronically Signed   By: Curlene Dolphin M.D.   On: 01/30/2020 14:12   Result Date: 02/08/2020 CLINICAL DATA:  Stereotactic biopsy recommended of heterogeneous calcifications in the upper-outer quadrant of the left breast. EXAM: LEFT BREAST STEREOTACTIC CORE NEEDLE BIOPSY COMPARISON:  Previous exams. FINDINGS: The patient and I discussed the procedure of stereotactic-guided biopsy including benefits and alternatives. We discussed the high likelihood of a successful procedure. We discussed the risks of the  procedure including infection, bleeding, tissue injury, clip migration, and inadequate sampling. Informed written consent was given. The usual time out protocol was performed immediately prior to the procedure. Using sterile technique and 1% Lidocaine as local anesthetic, under stereotactic guidance, a 9 gauge vacuum assisted device was used to perform core needle biopsy of calcifications in the upper-outer quadrant of the left breast using a superior approach. Specimen radiograph was performed showing multiple calcifications. Specimens with calcifications are identified for pathology. Lesion quadrant: Upper outer quadrant At the conclusion of the procedure, coil tissue marker clip was deployed into the biopsy cavity. Follow-up 2-view mammogram was performed and dictated separately. IMPRESSION: Stereotactic-guided biopsy of the left breast. No apparent complications. Electronically Signed: By: Curlene Dolphin M.D. On: 01/29/2020 09:25   MM LT BREAST BX W LOC DEV 1ST LESION IMAGE BX SPEC STEREO GUIDE  Addendum Date: 02/07/2020   ADDENDUM REPORT: 02/07/2020 12:11 ADDENDUM: Pathology revealed FLAT EPITHELIAL ATYPIA WITH CALCIFICATIONS of the LEFT breast, lateral. This was found to be concordant by Dr. Audie Pinto, with excision recommended. Pathology results were discussed with the patient by telephone. The patient reported doing well after the biopsy with tenderness at the site. Post biopsy instructions and care were reviewed and questions were answered. The patient was encouraged to call [facility] for any additional concerns. The patient has a recent diagnosis of LEFT breast cancer and per her request, she was referred to [facility] on February 14, 2020. Breast MRI is recommended for extent of disease given calcifications spanning at least 4 x 3 cm. Pathology results reported by Stacie Acres RN on 02/07/2020.  Electronically Signed   By: Audie Pinto M.D.   On: 02/07/2020 12:11   Result Date: 02/07/2020 CLINICAL DATA:  44 year old female with newly diagnosed left breast DCIS. Patient presents for biopsy of an additional group of calcifications to approve extent of disease. EXAM: LEFT BREAST STEREOTACTIC CORE NEEDLE BIOPSY COMPARISON:  Previous exams. FINDINGS: The patient and I discussed the procedure of stereotactic-guided biopsy including benefits and alternatives. We discussed the high likelihood of a successful procedure. We discussed the risks of the procedure including infection, bleeding, tissue injury, clip migration, and inadequate sampling. Informed written consent was given. The usual time out protocol was performed immediately prior to the procedure. Using sterile technique and 1% Lidocaine as local anesthetic, under stereotactic guidance, a 9 gauge vacuum assisted device was used  to perform core needle biopsy of calcifications in the outer left breast using a lateral approach. Specimen radiograph was performed showing at least 2 specimens with calcifications. Specimens with calcifications are identified for pathology. Lesion quadrant: Upper outer quadrant At the conclusion of the procedure, an X tissue marker clip was deployed into the biopsy cavity. Follow-up 2-view mammogram was performed and dictated separately. IMPRESSION: Stereotactic-guided biopsy of calcifications in the outer left breast. There is a small post biopsy hematoma. Electronically Signed: By: Audie Pinto M.D. On: 02/06/2020 10:25       IMPRESSION/PLAN: 1. Low-grade ER/PR positive DCIS of the left breast.Dr. Lisbeth Renshaw discusses the pathology findings and reviews the nature of noninvasive breast disease. The consensus from the breast conference includes breast conservation with lumpectomy followed by external radiotherapy to the breast followed by antiestrogen therapy. She is also contemplating mastectomy out of concern to wish  to reduce as much chance of recurrence. We did discuss the outcomes of mastectomy [place] and patients who choose lumpectomy and radiotherapy have similar reduction in risks of recurrence. We discussed the risks, benefits, short, and long term effects of radiotherapy, and the patient is interested in proceeding. Dr. Lisbeth Renshaw discusses the delivery and logistics of radiotherapy and anticipates a course of 6 1/2 weeks of radiotherapy with deep inspiration breath hold technique if she chooses lumpectomy. We will see her back about 2 weeks after surgery to discuss the simulation process and anticipate we starting radiotherapy about 4-6 weeks after surgery.  2. Possible genetic predisposition to malignancy. The patient is a candidate for genetic testing given her personal history. She was offered referral and is interested in meeting with genetics today as this information gathered could influence her surgical decision making.  3. Contraceptive Counseling. The patient is not sexually active and is aware she will need to avoid activity during treatment. She assures me she also has low risk of becoming pregnant as she struggled with infertility.    In a visit lasting 45 minutes, greater than 50% of the time was spent face to face reviewing her case, as well as in preparation of, discussing, and coordinating the patient's care.  The above documentation reflects my direct findings during this shared patient visit. Please see the separate note by Dr. Lisbeth Renshaw on this date for the remainder of the patient's plan of care.    Carola Rhine, PAC

## 2020-02-13 NOTE — Patient Instructions (Signed)
Managing Your Hypertension Hypertension is commonly called high blood pressure. This is when the force of your blood pressing against the walls of your arteries is too strong. Arteries are blood vessels that carry blood from your heart throughout your body. Hypertension forces the heart to work harder to pump blood, and may cause the arteries to become narrow or stiff. Having untreated or uncontrolled hypertension can cause heart attack, stroke, kidney disease, and other problems. What are blood pressure readings? A blood pressure reading consists of a higher number over a lower number. Ideally, your blood pressure should be below 120/80. The first ("top") number is called the systolic pressure. It is a measure of the pressure in your arteries as your heart beats. The second ("bottom") number is called the diastolic pressure. It is a measure of the pressure in your arteries as the heart relaxes. What does my blood pressure reading mean? Blood pressure is classified into four stages. Based on your blood pressure reading, your health care provider may use the following stages to determine what type of treatment you need, if any. Systolic pressure and diastolic pressure are measured in a unit called mm Hg. Normal  Systolic pressure: below 120.  Diastolic pressure: below 80. Elevated  Systolic pressure: 120-129.  Diastolic pressure: below 80. Hypertension stage 1  Systolic pressure: 130-139.  Diastolic pressure: 80-89. Hypertension stage 2  Systolic pressure: 140 or above.  Diastolic pressure: 90 or above. What health risks are associated with hypertension? Managing your hypertension is an important responsibility. Uncontrolled hypertension can lead to:  A heart attack.  A stroke.  A weakened blood vessel (aneurysm).  Heart failure.  Kidney damage.  Eye damage.  Metabolic syndrome.  Memory and concentration problems. What changes can I make to manage my  hypertension? Hypertension can be managed by making lifestyle changes and possibly by taking medicines. Your health care provider will help you make a plan to bring your blood pressure within a normal range. Eating and drinking   Eat a diet that is high in fiber and potassium, and low in salt (sodium), added sugar, and fat. An example eating plan is called the DASH (Dietary Approaches to Stop Hypertension) diet. To eat this way: ? Eat plenty of fresh fruits and vegetables. Try to fill half of your plate at each meal with fruits and vegetables. ? Eat whole grains, such as whole wheat pasta, brown rice, or whole grain bread. Fill about one quarter of your plate with whole grains. ? Eat low-fat diary products. ? Avoid fatty cuts of meat, processed or cured meats, and poultry with skin. Fill about one quarter of your plate with lean proteins such as fish, chicken without skin, beans, eggs, and tofu. ? Avoid premade and processed foods. These tend to be higher in sodium, added sugar, and fat.  Reduce your daily sodium intake. Most people with hypertension should eat less than 1,500 mg of sodium a day.  Limit alcohol intake to no more than 1 drink a day for nonpregnant women and 2 drinks a day for men. One drink equals 12 oz of beer, 5 oz of wine, or 1 oz of hard liquor. Lifestyle  Work with your health care provider to maintain a healthy body weight, or to lose weight. Ask what an ideal weight is for you.  Get at least 30 minutes of exercise that causes your heart to beat faster (aerobic exercise) most days of the week. Activities may include walking, swimming, or biking.  Include exercise   to strengthen your muscles (resistance exercise), such as weight lifting, as part of your weekly exercise routine. Try to do these types of exercises for 30 minutes at least 3 days a week.  Do not use any products that contain nicotine or tobacco, such as cigarettes and e-cigarettes. If you need help quitting,  ask your health care provider.  Control any long-term (chronic) conditions you have, such as high cholesterol or diabetes. Monitoring  Monitor your blood pressure at home as told by your health care provider. Your personal target blood pressure may vary depending on your medical conditions, your age, and other factors.  Have your blood pressure checked regularly, as often as told by your health care provider. Working with your health care provider  Review all the medicines you take with your health care provider because there may be side effects or interactions.  Talk with your health care provider about your diet, exercise habits, and other lifestyle factors that may be contributing to hypertension.  Visit your health care provider regularly. Your health care provider can help you create and adjust your plan for managing hypertension. Will I need medicine to control my blood pressure? Your health care provider may prescribe medicine if lifestyle changes are not enough to get your blood pressure under control, and if:  Your systolic blood pressure is 130 or higher.  Your diastolic blood pressure is 80 or higher. Take medicines only as told by your health care provider. Follow the directions carefully. Blood pressure medicines must be taken as prescribed. The medicine does not work as well when you skip doses. Skipping doses also puts you at risk for problems. Contact a health care provider if:  You think you are having a reaction to medicines you have taken.  You have repeated (recurrent) headaches.  You feel dizzy.  You have swelling in your ankles.  You have trouble with your vision. Get help right away if:  You develop a severe headache or confusion.  You have unusual weakness or numbness, or you feel faint.  You have severe pain in your chest or abdomen.  You vomit repeatedly.  You have trouble breathing. Summary  Hypertension is when the force of blood pumping  through your arteries is too strong. If this condition is not controlled, it may put you at risk for serious complications.  Your personal target blood pressure may vary depending on your medical conditions, your age, and other factors. For most people, a normal blood pressure is less than 120/80.  Hypertension is managed by lifestyle changes, medicines, or both. Lifestyle changes include weight loss, eating a healthy, low-sodium diet, exercising more, and limiting alcohol. This information is not intended to replace advice given to you by your health care provider. Make sure you discuss any questions you have with your health care provider. Document Revised: 02/17/2019 Document Reviewed: 09/23/2016 Elsevier Patient Education  2020 [employer]  Mindfulness-Based Stress Reduction Mindfulness-based stress reduction (MBSR) is a program that helps people learn to practice mindfulness. Mindfulness is the practice of intentionally paying attention to the present moment. It can be learned and practiced through techniques such as education, breathing exercises, meditation, and yoga. MBSR includes several mindfulness techniques in one program. MBSR works best when you understand the treatment, are willing to try new things, and can commit to spending time practicing what you learn. MBSR training may include learning about:  How your emotions, thoughts, and reactions affect your body.  New ways to respond to things that cause   negative thoughts to start (triggers).  How to notice your thoughts and let go of them.  Practicing awareness of everyday things that you normally do without thinking.  The techniques and goals of different types of meditation. What are the benefits of MBSR? MBSR can have many benefits, which include helping you to:  Develop self-awareness. This refers to knowing and understanding yourself.  Learn skills and attitudes that help you to participate in your own health  care.  Learn new ways to care for yourself.  Be more accepting about how things are, and let things go.  Be less judgmental and approach things with an open mind.  Be patient with yourself and trust yourself more. MBSR has also been shown to:  Reduce negative emotions, such as depression and anxiety.  Improve memory and focus.  Change how you sense and approach pain.  Boost your body's ability to fight infections.  Help you connect better with other people.  Improve your sense of well-being. Follow these instructions at home:   Find a local in-person or online MBSR program.  Set aside some time regularly for mindfulness practice.  Find a mindfulness practice that works best for you. This may include one or more of the following: ? Meditation. Meditation involves focusing your mind on a certain thought or activity. ? Breathing awareness exercises. These help you to stay present by focusing on your breath. ? Body scan. For this practice, you lie down and pay attention to each part of your body from head to toe. You can identify tension and soreness and intentionally relax parts of your body. ? Yoga. Yoga involves stretching and breathing, and it can improve your ability to move and be flexible. It can also provide an experience of testing your body's limits, which can help you release stress. ? Mindful eating. This way of eating involves focusing on the taste, texture, color, and smell of each bite of food. Because this slows down eating and helps you feel full sooner, it can be an important part of a weight-loss plan.  Find a podcast or recording that provides guidance for breathing awareness, body scan, or meditation exercises. You can listen to these any time when you have a free moment to rest without distractions.  Follow your treatment plan as told by your health care provider. This may include taking regular medicines and making changes to your diet or lifestyle as  recommended. How to practice mindfulness To do a basic awareness exercise:  Find a comfortable place to sit.  Pay attention to the present moment. Observe your thoughts, feelings, and surroundings just as they are.  Avoid placing judgment on yourself, your feelings, or your surroundings. Make note of any judgment that comes up, and let it go.  Your mind may wander, and that is okay. Make note of when your thoughts drift, and return your attention to the present moment. To do basic mindfulness meditation:  Find a comfortable place to sit. This may include a stable chair or a firm floor cushion. ? Sit upright with your back straight. Let your arms fall next to your side with your hands resting on your legs. ? If sitting in a chair, rest your feet flat on the floor. ? If sitting on a cushion, cross your legs in front of you.  Keep your head in a neutral position with your chin dropped slightly. Relax your jaw and rest the tip of your tongue on the roof of your mouth. Drop your gaze   to the floor. You can close your eyes if you like.  Breathe normally and pay attention to your breath. Feel the air moving in and out of your nose. Feel your belly expanding and relaxing with each breath.  Your mind may wander, and that is okay. Make note of when your thoughts drift, and return your attention to your breath.  Avoid placing judgment on yourself, your feelings, or your surroundings. Make note of any judgment or feelings that come up, let them go, and bring your attention back to your breath.  When you are ready, lift your gaze or open your eyes. Pay attention to how your body feels after the meditation. Where to find more information You can find more information about MBSR from:  Your health care provider.  Community-based meditation centers or programs.  Programs offered near you. Summary  Mindfulness-based stress reduction (MBSR) is a program that teaches you how to intentionally pay  attention to the present moment. It is used with other treatments to help you cope better with daily stress, emotions, and pain.  MBSR focuses on developing self-awareness, which allows you to respond to life stress without judgment or negative emotions.  MBSR programs may involve learning different mindfulness practices, such as breathing exercises, meditation, yoga, body scan, or mindful eating. Find a mindfulness practice that works best for you, and set aside time for it on a regular basis. This information is not intended to replace advice given to you by your health care provider. Make sure you discuss any questions you have with your health care provider. Document Revised: 10/08/2017 Document Reviewed: 03/04/2017 Elsevier Patient Education  2020 [employer] Inc.  

## 2020-02-13 NOTE — Progress Notes (Signed)
Established Patient Office Visit  Subjective:  Patient ID: Victoria Maddox, female    DOB: 02-18-1976  Age: 44 y.o. MRN: [id number]  CC:  Chief Complaint  Patient presents with  . Transitions Of Care    Toc from Dr. Deborra Medina, exam for general surgery appointment for breast cancer.     HPI Victoria Maddox presents for follow-up of her hypertension.  Blood pressure has been running higher at home and at the drugstore.  She has been experiencing headaches on the top of her head.  These are different than her usual migraines.  She denies any fever chills postnasal drip sneezing facial pressure or teeth pain.  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  Comeetings with oncology and general surgery are planned for the next few days.  This started with an abnormal mammogram about a month ago.  Her headaches have been occurring prior to this current experience.  Blood pressure has been treated with HCTZ and Calan.  She has only been taking the Calan once daily instead of 3 times daily.   Past Medical History:  Diagnosis Date  . Allergy   . Depression   . Frequent headaches   . Heart murmur   . History of recurrent UTIs   . Hypertension   . Migraines   . Urinary incontinence     History reviewed. No pertinent surgical history.  Family History  Problem Relation Age of Onset  . Heart attack Mother   . Heart disease Mother   . Hypertension Mother   . Early death Father   . Hypertension Father   . Hypertension Sister   . Diabetes Brother   . Hypertension Brother   . Asthma Sister   . Hypertension Sister   . Hyperlipidemia Brother   . Hypertension Brother   . Mental illness Brother   . Heart disease Sister   . Hypertension Sister   . Arthritis Brother   . Diabetes Brother   . Hypertension Brother     Social History   Socioeconomic History  . Marital status: Married    Spouse name: Not on file  . Number of children: Not on file  . Years of education:  Not on file  . Highest education level: Not on file  Occupational History  . Not on file  Tobacco Use  . Smoking status: Never Smoker  . Smokeless tobacco: Never Used  Substance and Sexual Activity  . Alcohol use: Not Currently  . Drug use: Never  . Sexual activity: Yes    Partners: Male  Other Topics Concern  . Not on file  Social History Narrative  . Not on file   Social Determinants of Health   Financial Resource Strain:   . Difficulty of Paying Living Expenses:   Food Insecurity:   . Worried About Charity fundraiser in the Last Year:   . Arboriculturist in the Last Year:   Transportation Needs:   . Film/video editor (Medical):   Marland Kitchen Lack of Transportation (Non-Medical):   Physical Activity:   . Days of Exercise per Week:   . Minutes of Exercise per Session:   Stress:   . Feeling of Stress :   Social Connections:   . Frequency of Communication with Friends and Family:   . Frequency of Social Gatherings with Friends and Family:   . Attends Religious Services:   . Active Member of Clubs or Organizations:   .  Attends Archivist Meetings:   Marland Kitchen Marital Status:   Intimate Partner Violence:   . Fear of Current or Ex-Partner:   . Emotionally Abused:   Marland Kitchen Physically Abused:   . Sexually Abused:     Outpatient Medications Prior to Visit  Medication Sig Dispense Refill  . acetaminophen (TYLENOL) 325 MG tablet Take 650 mg by mouth every 6 (six) hours as needed.    . diphenhydrAMINE (BENADRYL) 25 mg capsule Take 25 mg by mouth at bedtime as needed.    . hydrochlorothiazide (HYDRODIURIL) 25 MG tablet TAKE 1 TABLET (25 MG TOTAL) BY MOUTH DAILY. 90 tablet 1  . verapamil (CALAN) 80 MG tablet TAKE 1 TABLET BY MOUTH 3 TIMES DAILY. (Patient taking differently: Take 80 mg by mouth daily. Pt states she is only supposed to take this once a day and three times daily is incorrect.) 270 tablet 1  . Albuterol Sulfate (PROAIR RESPICLICK) 123XX123 (90 Base) MCG/ACT AEPB Inhale 1-2  puffs into the lungs every 6 (six) hours as needed. For wheezing, SOB or cough (Patient not taking: Reported on 02/13/2020) 1 each 0  . ondansetron (ZOFRAN) 4 MG tablet Take 1 tablet (4 mg total) by mouth every 8 (eight) hours as needed for nausea or vomiting. (Patient not taking: Reported on 02/13/2020) 20 tablet 0  . SUMAtriptan (IMITREX) 50 MG tablet 1 tab po x 1 dose, may repeat in 2hrs x 1 dose if needed (Patient not taking: Reported on 09/30/2019) 10 tablet 2   No facility-administered medications prior to visit.    Allergies  Allergen Reactions  . Trazodone And Nefazodone     Dizziness  . Zoloft [Sertraline Hcl]     Anxiousness, lash out at kids    ROS Review of Systems    Objective:    Physical Exam  BP 122/68   Pulse 65   Temp 97.8 F (36.6 C) (Tympanic)   Ht 5\' 4"  (1.626 m)   Wt 137 lb 6.4 oz (62.3 kg)   SpO2 96%   BMI 23.58 kg/m  Wt Readings from Last 3 Encounters:  02/13/20 137 lb 6.4 oz (62.3 kg)  09/27/18 125 lb (56.7 kg)  08/09/18 121 lb 3.2 oz (55 kg)     Health Maintenance Due  Topic Date Due  . HIV Screening  Never done  . PAP SMEAR-Modifier  Never done    There are no preventive care reminders to display for this patient.  Lab Results  Component Value Date   TSH 2.57 05/04/2018   Lab Results  Component Value Date   WBC 5.1 05/04/2018   HGB 11.5 (L) 05/04/2018   HCT 34.8 (L) 05/04/2018   MCV 81.7 05/04/2018   PLT 206.0 05/04/2018   Lab Results  Component Value Date   NA 136 05/04/2018   K 3.7 05/04/2018   CO2 28 05/04/2018   GLUCOSE 110 (H) 05/04/2018   BUN 13 05/04/2018   CREATININE 0.65 05/04/2018   BILITOT 0.3 05/04/2018   ALKPHOS 54 05/04/2018   AST 14 05/04/2018   ALT 15 05/04/2018   PROT 7.3 05/04/2018   ALBUMIN 4.1 05/04/2018   CALCIUM 9.5 05/04/2018   GFR 128.72 05/04/2018   Lab Results  Component Value Date   CHOL 180 05/04/2018   Lab Results  Component Value Date   HDL 82.70 05/04/2018   Lab Results    Component Value Date   LDLCALC 83 05/04/2018   Lab Results  Component Value Date   TRIG 68.0 05/04/2018  Lab Results  Component Value Date   CHOLHDL 2 05/04/2018   No results found for: HGBA1C    Assessment & Plan:   Problem List Items Addressed This Visit      Cardiovascular and Mediastinum   HTN (hypertension) - Primary   Relevant Medications   verapamil (CALAN SR) 180 MG CR tablet     Other   Ductal carcinoma in situ (DCIS) of left breast   Relevant Orders   Ambulatory referral to Interventional Radiology   Ambulatory referral to Hematology      Meds ordered this encounter  Medications  . verapamil (CALAN SR) 180 MG CR tablet    Sig: Take 1 tablet (180 mg total) by mouth at bedtime.    Dispense:  90 tablet    Refill:  0    Follow-up: Return in about 5 weeks (around 03/19/2020), or Hold HCTZ. Stop the Calan that you are currently taking. Bring your BP cuff next visit.Libby Maw, MD

## 2020-02-14 ENCOUNTER — Other Ambulatory Visit: Payer: Self-pay

## 2020-02-14 ENCOUNTER — Inpatient Hospital Stay
Payer: No Typology Code available for payment source | Attending: Hematology and Oncology | Admitting: Hematology and Oncology

## 2020-02-14 ENCOUNTER — Inpatient Hospital Stay: Payer: No Typology Code available for payment source

## 2020-02-14 ENCOUNTER — Encounter: Payer: Self-pay | Admitting: *Deleted

## 2020-02-14 ENCOUNTER — Ambulatory Visit: Payer: No Typology Code available for payment source | Admitting: Physical Therapy

## 2020-02-14 ENCOUNTER — Inpatient Hospital Stay (HOSPITAL_BASED_OUTPATIENT_CLINIC_OR_DEPARTMENT_OTHER): Payer: No Typology Code available for payment source | Admitting: Genetic Counselor

## 2020-02-14 ENCOUNTER — Encounter: Payer: Self-pay | Admitting: Genetic Counselor

## 2020-02-14 ENCOUNTER — Ambulatory Visit
Admission: RE | Admit: 2020-02-14 | Discharge: 2020-02-14 | Disposition: A | Discharge status: Home / Self Care | Payer: No Typology Code available for payment source | Source: Ambulatory Visit | Attending: Radiation Oncology | Admitting: Radiation Oncology

## 2020-02-14 DIAGNOSIS — Z170 Estrogen receptor positive status [ER+]: Secondary | ICD-10-CM | POA: Insufficient documentation

## 2020-02-14 DIAGNOSIS — I10 Essential (primary) hypertension: Secondary | ICD-10-CM | POA: Diagnosis not present

## 2020-02-14 DIAGNOSIS — Z79899 Other long term (current) drug therapy: Secondary | ICD-10-CM | POA: Insufficient documentation

## 2020-02-14 DIAGNOSIS — D0512 Intraductal carcinoma in situ of left breast: Secondary | ICD-10-CM | POA: Diagnosis present

## 2020-02-14 DIAGNOSIS — Z800 Family history of malignant neoplasm of digestive organs: Secondary | ICD-10-CM

## 2020-02-14 DIAGNOSIS — Z8051 Family history of malignant neoplasm of kidney: Secondary | ICD-10-CM | POA: Diagnosis not present

## 2020-02-14 DIAGNOSIS — F329 Major depressive disorder, single episode, unspecified: Secondary | ICD-10-CM | POA: Diagnosis not present

## 2020-02-14 DIAGNOSIS — Z803 Family history of malignant neoplasm of breast: Secondary | ICD-10-CM | POA: Insufficient documentation

## 2020-02-14 LAB — CMP (CANCER CENTER ONLY)
ALT: 11 U/L (ref 0–44)
AST: 12 U/L — ABNORMAL LOW (ref 15–41)
Albumin: 3.7 g/dL (ref 3.5–5.0)
Alkaline Phosphatase: 81 U/L (ref 38–126)
Anion gap: 7 (ref 5–15)
BUN: 12 mg/dL (ref 6–20)
CO2: 27 mmol/L (ref 22–32)
Calcium: 8.9 mg/dL (ref 8.9–10.3)
Chloride: 106 mmol/L (ref 98–111)
Creatinine: 0.77 mg/dL (ref 0.44–1.00)
GFR, Est AFR Am: >60 mL/min (ref >60)
GFR, Estimated: >60 mL/min (ref >60)
Glucose, Bld: 107 mg/dL — ABNORMAL HIGH (ref 70–99)
Potassium: 3.8 mmol/L (ref 3.5–5.1)
Sodium: 140 mmol/L (ref 135–145)
Total Bilirubin: <0.2 mg/dL — ABNORMAL LOW (ref 0.3–1.2)
Total Protein: 7.3 g/dL (ref 6.5–8.1)

## 2020-02-14 LAB — CBC WITH DIFFERENTIAL (CANCER CENTER ONLY)
Abs Immature Granulocytes: 0.01 10*3/uL (ref 0.00–0.07)
Basophils Absolute: 0 10*3/uL (ref 0.0–0.1)
Basophils Relative: 1 %
Eosinophils Absolute: 0.2 10*3/uL (ref 0.0–0.5)
Eosinophils Relative: 5 %
HCT: 36.1 % (ref 36.0–46.0)
Hemoglobin: 11.7 g/dL — ABNORMAL LOW (ref 12.0–15.0)
Immature Granulocytes: 0 %
Lymphocytes Relative: 42 %
Lymphs Abs: 2.2 10*3/uL (ref 0.7–4.0)
MCH: 26.8 pg (ref 26.0–34.0)
MCHC: 32.4 g/dL (ref 30.0–36.0)
MCV: 82.8 fL (ref 80.0–100.0)
Monocytes Absolute: 0.6 10*3/uL (ref 0.1–1.0)
Monocytes Relative: 11 %
Neutro Abs: 2.1 10*3/uL (ref 1.7–7.7)
Neutrophils Relative %: 41 %
Platelet Count: 308 10*3/uL (ref 150–400)
RBC: 4.36 MIL/uL (ref 3.87–5.11)
RDW: 13.7 % (ref 11.5–15.5)
WBC Count: 5.1 10*3/uL (ref 4.0–10.5)
nRBC: 0 % (ref 0.0–0.2)

## 2020-02-14 LAB — GENETIC SCREENING ORDER

## 2020-02-14 MED ORDER — LORAZEPAM 0.5 MG PO TABS: 0.5000 mg | ORAL_TABLET | Freq: Once | ORAL | 0 refills | Status: AC

## 2020-02-14 MED FILL — LORazepam 0.5 MG TABS: 0.5 | 1 days supply | Qty: 2 | Fill #0

## 2020-02-14 NOTE — Progress Notes (Signed)
REFERRING PROVIDER: Nicholas Lose, MD [address] [place],  [place] [address]  PRIMARY PROVIDER:  Libby Maw, MD  PRIMARY REASON FOR VISIT:  1. Ductal carcinoma in situ (DCIS) of left breast   2. Family history of breast cancer   3. Family history of throat cancer   4. Family history of kidney cancer      I connected with Ms. Bruyere on 02/14/2020 at 11:30 am EDT by Webex video conference and verified that I am speaking with the correct person using two identifiers.   Patient location: [facility] clinic Provider location: [facility] office  HISTORY OF PRESENT ILLNESS:   Ms. Pagel, a 44 y.o. female, was seen for a [place] cancer genetics consultation at the request of Dr. Lindi Adie due to a personal and family history of breast cancer.  Ms. Hulme presents to clinic today to discuss the possibility of a hereditary predisposition to cancer, genetic testing, and to further clarify her future cancer risks, as well as potential cancer risks for family members.   In April 2021, at the age of 71, Ms. Armel was diagnosed with DCIS (ER+/PR+) of the left breast. The treatment plan includes surgery, adjuvant radiation therapy, and antiestrogen therapy.    CANCER HISTORY:  Oncology History  Ductal carcinoma in situ (DCIS) of left breast  02/08/2020 Initial Diagnosis   Screening mammogram detected left breast calcifications, 2.9cm. Biopsy showed DCIS with calcifications, low grade, ER+ 70%, PR+ 90%.      RISK FACTORS:  Menarche was at age 19.  First live birth at age 32.  OCP use for approximately 1 month.  Ovaries intact: yes.  Hysterectomy: no.  Menopausal status: premenopausal.  HRT use: 0 years. Colonoscopy: n/a. Mammogram within the last year: yes. Number of breast biopsies: 3 (two recent biopsies and one 10 years ago showing lymph node tissue, per patient). Any excessive radiation exposure in the past: no  Past Medical History:  Diagnosis Date  . Allergy     . Depression   . Family history of breast cancer   . Family history of kidney cancer   . Family history of throat cancer   . Frequent headaches   . Heart murmur   . History of recurrent UTIs   . Hypertension   . Migraines   . Urinary incontinence     Past Surgical History:  Procedure Laterality Date  . DILATION AND CURETTAGE OF UTERUS      Social History   Socioeconomic History  . Marital status: Married    Spouse name: Not on file  . Number of children: Not on file  . Years of education: Not on file  . Highest education level: Not on file  Occupational History  . Not on file  Tobacco Use  . Smoking status: Never Smoker  . Smokeless tobacco: Never Used  Substance and Sexual Activity  . Alcohol use: Not Currently  . Drug use: Never  . Sexual activity: Yes    Partners: Male  Other Topics Concern  . Not on file  Social History Narrative  . Not on file   Social Determinants of Health   Financial Resource Strain:   . Difficulty of Paying Living Expenses:   Food Insecurity:   . Worried About Charity fundraiser in the Last Year:   . Arboriculturist in the Last Year:   Transportation Needs:   . Film/video editor (Medical):   Marland Kitchen Lack of Transportation (Non-Medical):   Physical Activity:   .  Days of Exercise per Week:   . Minutes of Exercise per Session:   Stress:   . Feeling of Stress :   Social Connections:   . Frequency of Communication with Friends and Family:   . Frequency of Social Gatherings with Friends and Family:   . Attends Religious Services:   . Active Member of Clubs or Organizations:   . Attends Archivist Meetings:   Marland Kitchen Marital Status:      FAMILY HISTORY:  We obtained a detailed, 4-generation family history.  Significant diagnoses are listed below: Family History  Problem Relation Age of Onset  . Heart attack Mother   . Heart disease Mother   . Hypertension Mother   . Early death Father        unknown cause  .  Hypertension Father   . Hypertension Sister   . Diabetes Brother   . Hypertension Brother   . Asthma Sister   . Hypertension Sister   . Hyperlipidemia Brother   . Hypertension Brother   . Mental illness Brother   . Heart disease Sister   . Hypertension Sister   . Arthritis Brother   . Diabetes Brother   . Hypertension Brother   . Breast cancer Cousin 36       paternal first cousin  . Throat cancer Sister 9       non-smoker  . Breast cancer Cousin        dx. in her 67s, paternal first cousin once removed  . Kidney cancer Other 36   Ms. Raneri has three children - one daughter who is 30 and fraternal twin sons who are 22 years old. She has four brothers and three sisters who are living and range in age from 77 to 16. She had a fourth sister who died when she was 62 from throat cancer diagnosed at age 40. Ms. Uriegas notes that her sister did not have a history of smoking. Ms. Montini also notes that she is related to her husband, who is her second cousin through her paternal side of the family and has a history of renal cell carcinoma diagnosed at age 43.   Ms. Krah mother died at the age of 38.  She has two maternal aunts and one maternal uncle, two of whom have died at ages older than 30. Her maternal grandmother died when she was younger than 28, and Ms. Principato is not sure how old her maternal grandfather was when he died.  Ms. Hickam father died at the age 29 from an unknown cause. She had at least 10 paternal aunts and uncles. One uncle is currently living, and the others are deceased. Some of these aunts and uncles died when they were younger than 52, most from either a heart attack or a stroke. One paternal first cousin died from breast cancer when she was 28. This cousin had a daughter who also died from breast cancer in her 68s. Ms. Crean paternal grandmother died at the age of 48, and she is not sure how old her paternal grandfather was when he died.  Ms. Rogacki  notes that many of her family members lived in [place] and did not have access to doctors. Therefore it is possible that there could be other family members who have had cancer that were not diagnosed.   Ms. Skiver is unaware of previous family history of genetic testing for hereditary cancer risks. Patient's maternal and paternal ancestors are of [place] descent. There is no reported  Ashkenazi Jewish ancestry. There is known consanguinity - Ms. Casale's husband is her second cousin through her paternal side. She notes that her parents were not related to one another.  GENETIC COUNSELING ASSESSMENT: Ms. Marion is a 44 y.o. female with a personal and family history of young-onset breast cancer, which is somewhat suggestive of a hereditary cancer syndrome and predisposition to cancer. We, therefore, discussed and recommended the following at today's visit.   DISCUSSION: We discussed that 5-10% of breast cancer is hereditary, with most cases associated with the BRCA1 and BRCA2 genes.  There are other genes that can be associated with hereditary breast cancer syndromes.  These include ATM, CHEK2, PALB2, etc.  We discussed that testing is beneficial for several reasons, including knowing about other cancer risks, identifying potential screening and risk-reduction options that may be appropriate, and to understand if other family members could be at risk for cancer and allow them to undergo genetic testing.  We reviewed the characteristics, features and inheritance patterns of hereditary cancer syndromes. We also discussed genetic testing, including the appropriate family members to test, the process of testing, insurance coverage and turn-around-time for results. We discussed the implications of a negative, positive and/or variant of uncertain significant result. In order to get genetic test results in a timely manner so that Ms. Verry can use these genetic test results for surgical decisions, we recommended  Ms. Malek pursue genetic testing for the [facility] Breast Cancer STAT Panel. Once complete, we recommend Ms. Maravilla pursue reflex genetic testing to the Multi-Cancer Panel.   The Breast Cancer STAT Panel offered by [facility] includes sequencing and deletion/duplication analysis for the following 9 genes:  ATM, BRCA1, BRCA2, CDH1, CHEK2, PALB2, PTEN, STK11 and TP53.  The Multi-Cancer Panel offered by [facility] includes sequencing and/or deletion duplication testing of the following 85 genes: AIP, ALK, APC, ATM, AXIN2,BAP1,  BARD1, BLM, BMPR1A, BRCA1, BRCA2, BRIP1, CASR, CDC73, CDH1, CDK4, CDKN1B, CDKN1C, CDKN2A (p14ARF), CDKN2A (p16INK4a), CEBPA, CHEK2, CTNNA1, DICER1, DIS3L2, EGFR (c.2369C>T, p.Thr790Met variant only), EPCAM (Deletion/duplication testing only), FH, FLCN, GATA2, GPC3, GREM1 (Promoter region deletion/duplication testing only), HOXB13 (c.251G>A, p.Gly84Glu), HRAS, KIT, MAX, MEN1, MET, MITF (c.952G>A, p.Glu318Lys variant only), MLH1, MSH2, MSH3, MSH6, MUTYH, NBN, NF1, NF2, NTHL1, PALB2, PDGFRA, PHOX2B, PMS2, POLD1, POLE, POT1, PRKAR1A, PTCH1, PTEN, RAD50, RAD51C, RAD51D, RB1, RECQL4, RET, RNF43, RUNX1, SDHAF2, SDHA (sequence changes only), SDHB, SDHC, SDHD, SMAD4, SMARCA4, SMARCB1, SMARCE1, STK11, SUFU, TERC, TERT, TMEM127, TP53, TSC1, TSC2, VHL, WRN and WT1.    Based on Ms. Nicklaus's personal and family history of cancer, she meets medical criteria for genetic testing. Despite that she meets criteria, she may still have an out of pocket cost.   PLAN: After considering the risks, benefits, and limitations, Ms. Poch provided informed consent to pursue genetic testing and the blood sample was sent to [facility] for analysis of the Breast Cancer STAT Panel + Multi-Cancer Panel. Results should be available within approximately one-two weeks' time, at which point they will be disclosed by telephone to Ms. Crumpacker, as will any additional recommendations warranted by these results. Ms. Moncrieffe  will receive a summary of her genetic counseling visit and a copy of her results once available. This information will also be available in Epic.   Ms. Yanni questions were answered to her satisfaction today. Our contact information was provided should additional questions or concerns arise. Thank you for the referral and allowing [place] to share in the care of your patient.   Clint Guy, MS, [place] Licensed,  Certified Dispensing [email or web address] Phone: [phone]  The patient was seen for a total of 25 minutes in face-to-face genetic counseling.  This patient was discussed with Drs. Magrinat, Lindi Adie and/or Burr Medico who agrees with the above.    _______________________________________________________________________ For Office Staff:  Number of people involved in session: 1 Was an Intern/ student involved with case: no

## 2020-02-14 NOTE — Assessment & Plan Note (Signed)
02/08/2020: Screening mammogram detected left breast calcifications, 2.9cm. Biopsy showed DCIS with calcifications, low grade, ER+ 70%, PR+ 90%.  Additional biopsy: Flat epithelial atypia which needs excision Tis NX stage 0  Pathology review: I discussed with the patient the difference between DCIS and invasive breast cancer. It is considered a precancerous lesion. DCIS is classified as a 0. It is generally detected through mammograms as calcifications. We discussed the significance of grades and its impact on prognosis. We also discussed the importance of ER and PR receptors and their implications to adjuvant treatment options. Prognosis of DCIS dependence on grade, comedo necrosis. It is anticipated that if not treated, 20-30% of DCIS can develop into invasive breast cancer.  Recommendation: 1. Breast conserving surgery 2. Followed by adjuvant radiation therapy 3. Followed by antiestrogen therapy with tamoxifen 5 years  Tamoxifen counseling: We discussed the risks and benefits of tamoxifen. These include but not limited to insomnia, hot flashes, mood changes, vaginal dryness, and weight gain. Although rare, serious side effects including endometrial cancer, risk of blood clots were also discussed. We strongly believe that the benefits far outweigh the risks. Patient understands these risks and consented to starting treatment. Planned treatment duration is 5 years.  Return to clinic after surgery to discuss the final pathology report and come up with an adjuvant treatment plan.

## 2020-02-21 ENCOUNTER — Other Ambulatory Visit: Payer: Self-pay

## 2020-02-21 ENCOUNTER — Ambulatory Visit
Admission: RE | Admit: 2020-02-21 | Discharge: 2020-02-21 | Disposition: A | Discharge status: Home / Self Care | Payer: No Typology Code available for payment source | Source: Ambulatory Visit | Attending: General Surgery | Admitting: General Surgery

## 2020-02-21 DIAGNOSIS — D0512 Intraductal carcinoma in situ of left breast: Secondary | ICD-10-CM

## 2020-02-21 IMAGING — MR MR BREAST BILAT WO/W CM
8 of 12 series · 32 of 48 positions shown · IV contrast (14cc multihance)
Comparison: Previous exam(s).

CLINICAL DATA: 43-year-old female with newly diagnosed left breast
DCIS. Suspicious calcifications span greater than 4 cm in the upper
outer left breast.

LABS:  None performed on site.
EXAM:
BILATERAL BREAST MRI WITH AND WITHOUT CONTRAST
TECHNIQUE: Multiplanar, multisequence MR images of both breasts were obtained
prior to and following the intravenous administration of 14 ml of
Gadavist.

[Series 3: t2_tirm_tra ipat (a-p) · axial · 3.0mm · 0.70mm/px · 1 of 58 slices shown]
[im 1/58]
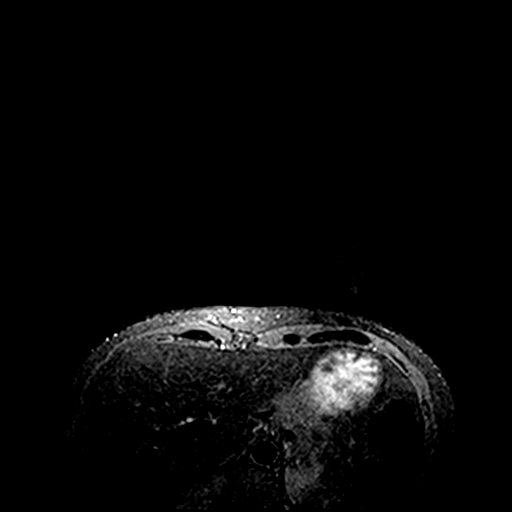

[Series 4: fl3d pre-cm no · axial · non-contrast · 1.2mm · 0.94mm/px · z∈[-94,+96]mm · 5 of 160 slices shown]
[im 1/160]
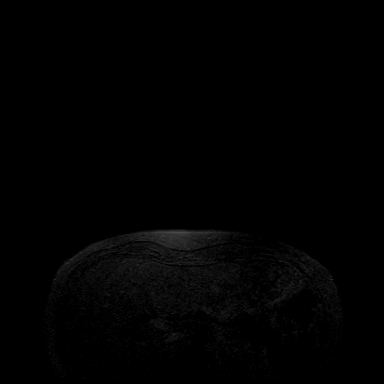
[im 40/160]
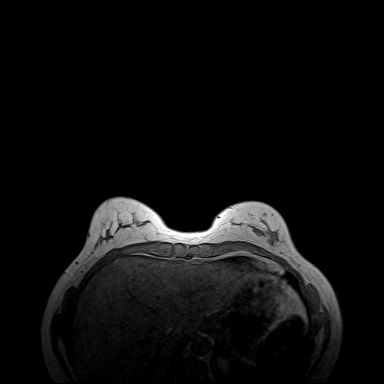
[im 80/160]
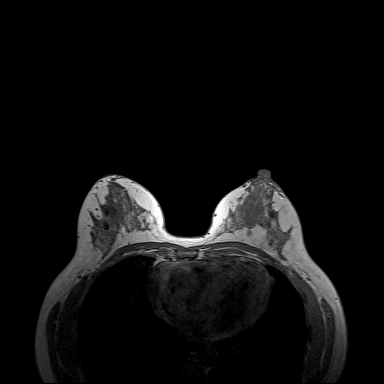
[im 120/160]
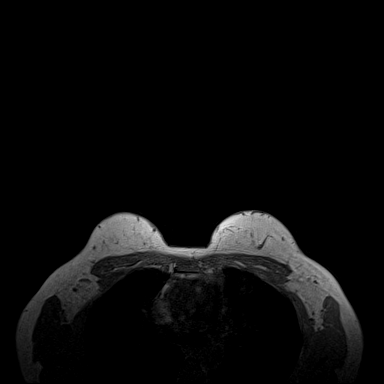
[im 160/160]
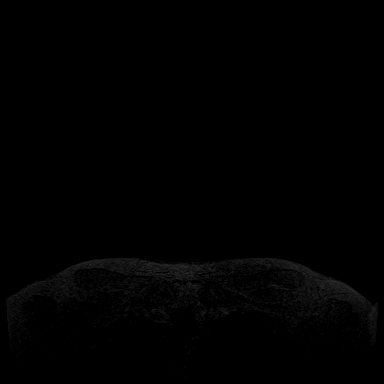

[Series 5: fl3d pre-cm · axial · non-contrast · 1.2mm · 0.94mm/px · z∈[-94,+96]mm · 5 of 160 slices shown]
[im 1/160]
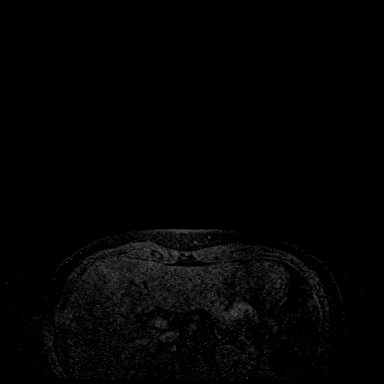
[im 40/160]
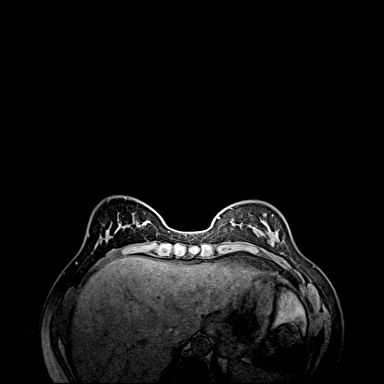
[im 80/160]
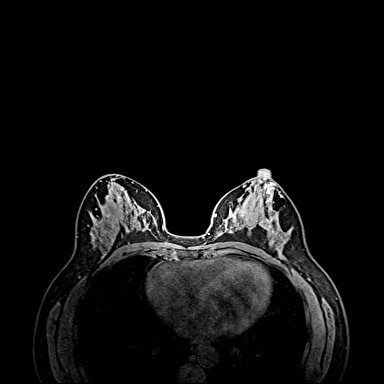
[im 120/160]
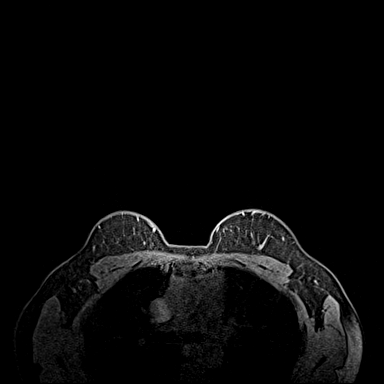
[im 160/160]
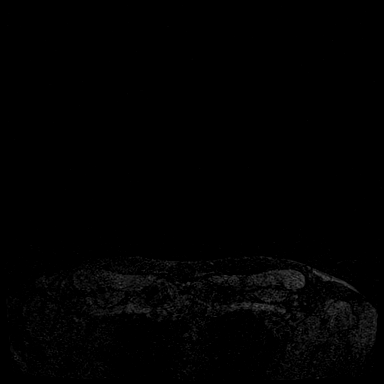

[Series 15: fl3d post immediate · axial · 1.2mm · 0.94mm/px · z∈[-94,+96]mm · 5 of 160 slices shown (1 of 3)]
[im 1/160]
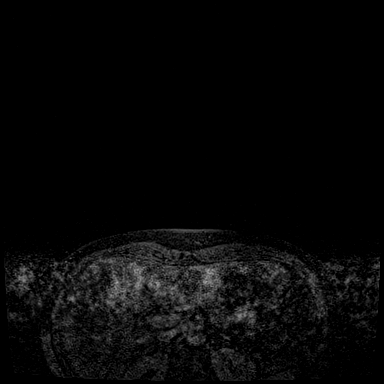
[im 40/160]
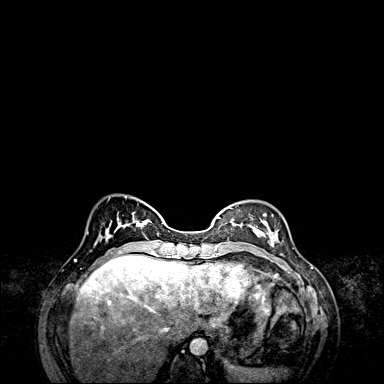
[im 80/160]
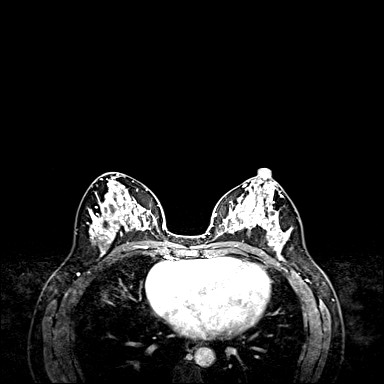
[im 120/160]
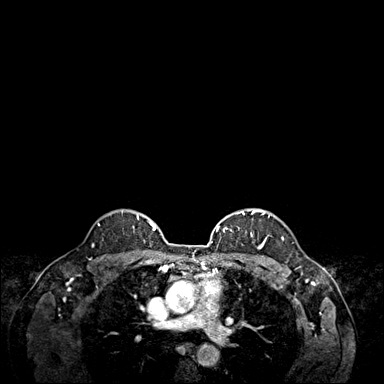
[im 160/160]
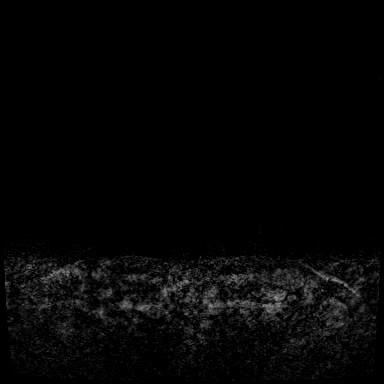

[Series 16: fl3d post immediate · axial · 1.2mm · 0.94mm/px · z∈[-94,+96]mm · 5 of 160 slices shown (2 of 3)]
[im 1/160]
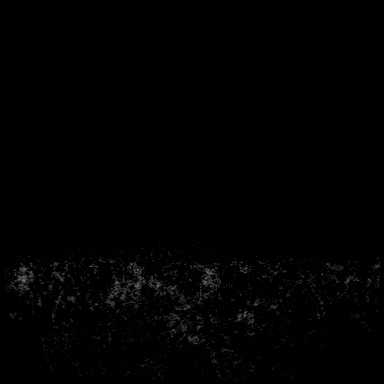
[im 40/160]
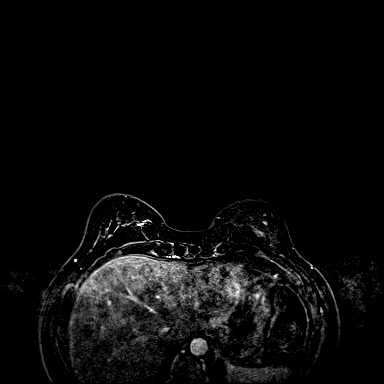
[im 80/160]
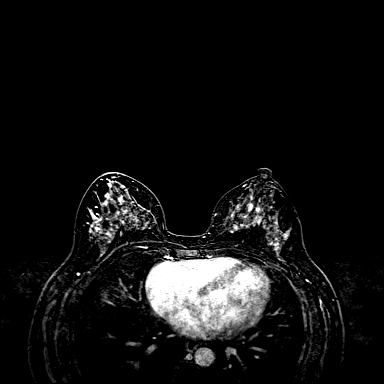
[im 120/160]
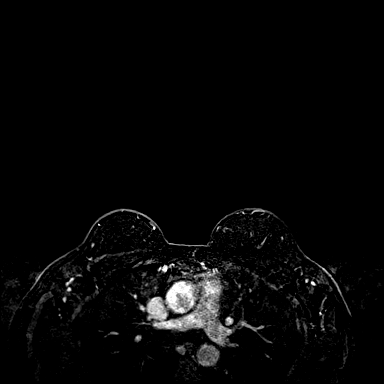
[im 160/160]
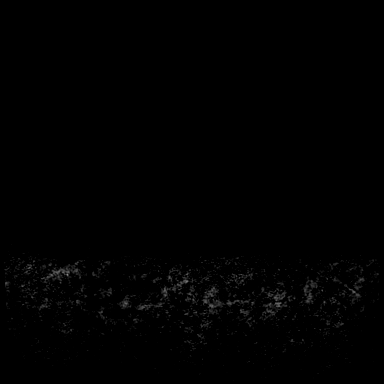

[Series 17: fl3d post immediate · axial · 192.0mm · 0.94mm/px · 1 of 1 slices shown (3 of 3)]
[im 1/1]
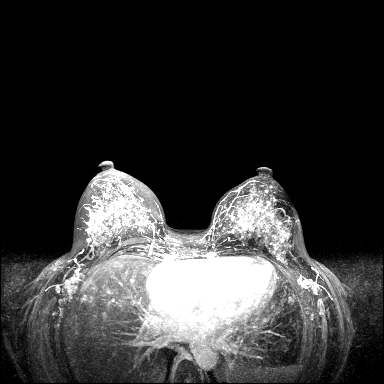

[Series 18: fl3d post 3min · axial · 1.2mm · 0.94mm/px · z∈[-94,+96]mm · 6 of 160 slices shown]
[im 1/160]
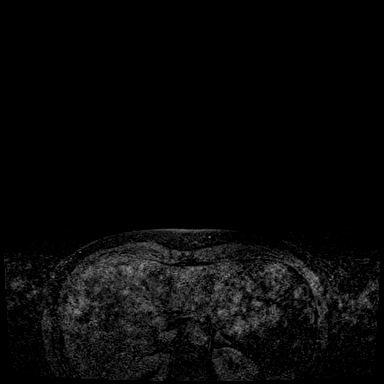
[im 32/160]
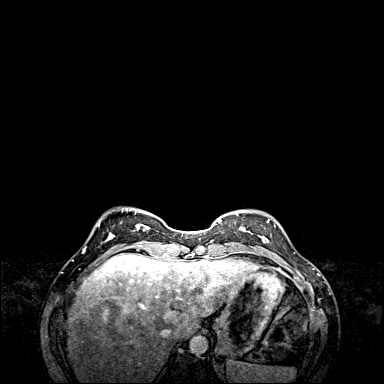
[im 64/160]
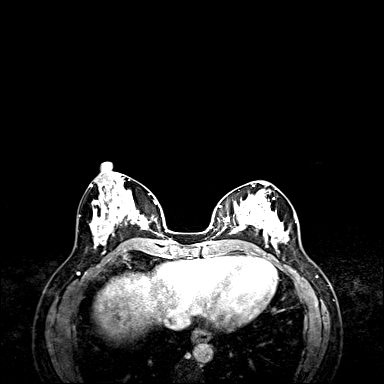
[im 96/160]
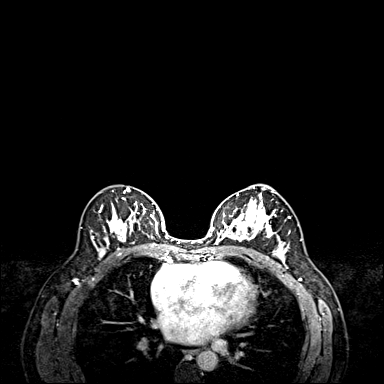
[im 128/160]
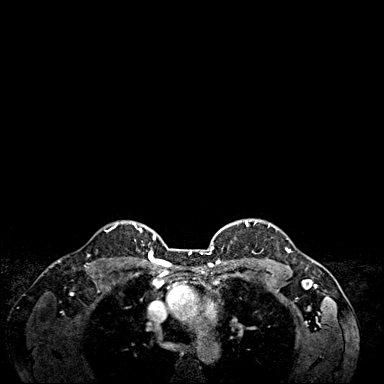
[im 160/160]
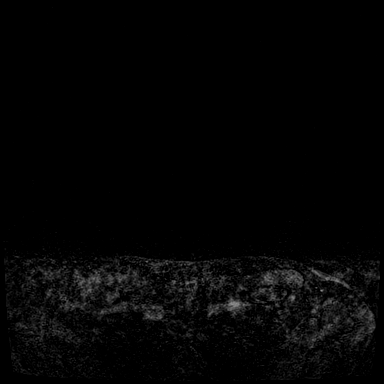

[Series 19: fl3d post 3min_sub · axial · 1.2mm · 0.94mm/px · z∈[-94,+20]mm · 4 of 160 slices shown]
[im 1/160]
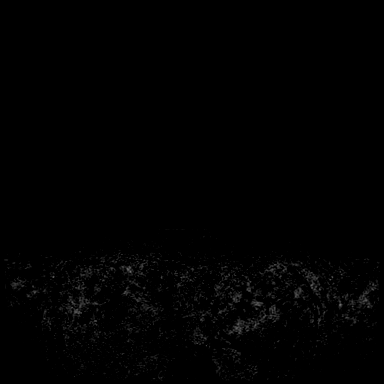
[im 32/160]
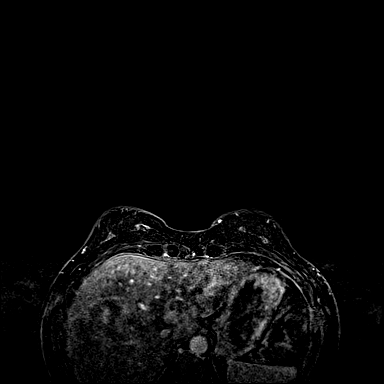
[im 64/160]
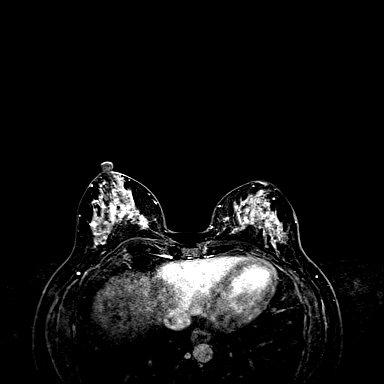
[im 96/160]
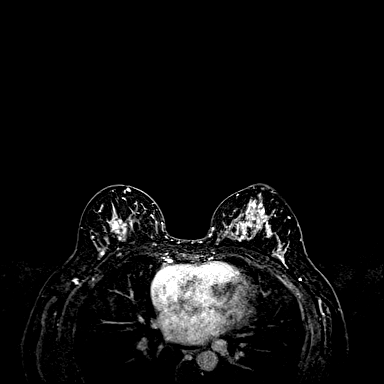

[32 of 48 positions shown; findings below may reference images not displayed]

Three-dimensional MR images were rendered by post-processing of the
original MR data on an independent workstation. The
three-dimensional MR images were interpreted, and findings are
reported in the following complete MRI report for this study. Three
dimensional images were evaluated at the independent [employer]
workstation
FINDINGS: Breast composition: d. Extreme fibroglandular tissue.

Background parenchymal enhancement: Marked.

Right breast: There is marked background parenchymal enhancement
limiting evaluation. Within this limitation, no dominant mass or
suspicious enhancement beyond background is identified.

Left breast: Susceptibility artifact from two post biopsy clips are
identified in the lateral and upper outer aspect at middle depth.
There is marked background parenchymal enhancement limiting
evaluation. Within this limitation, no dominant mass or suspicious
enhancement beyond background is identified at the biopsy sites or
within the remainder of the left breast.

Lymph nodes: No abnormal appearing lymph nodes.

Ancillary findings:  None.
IMPRESSION: 1. Post-biopsy changes in the upper-outer left breast consistent
with the patient's biopsy proven site of malignancy.
2. Otherwise, limited diagnostic evaluation given marked background
parenchymal enhancement bilaterally. Within this limitation, no
dominant mass or suspicious enhancement beyond background is
identified in either breast.
3. No suspicious lymphadenopathy.

RECOMMENDATION:
Per clinical treatment plan.

BI-RADS CATEGORY  6: Known biopsy-proven malignancy.

## 2020-02-21 MED ORDER — GADOBENATE DIMEGLUMINE 529 MG/ML IV SOLN
14.0000 mL | Freq: Once | INTRAVENOUS | Status: AC | PRN
  Administered 2020-02-21: 14 mL via INTRAVENOUS

## 2020-02-22 ENCOUNTER — Encounter: Payer: Self-pay | Admitting: Genetic Counselor

## 2020-02-22 ENCOUNTER — Encounter: Payer: Self-pay | Admitting: *Deleted

## 2020-02-22 ENCOUNTER — Ambulatory Visit: Payer: Self-pay | Admitting: Genetic Counselor

## 2020-02-22 ENCOUNTER — Telehealth: Payer: Self-pay | Admitting: Genetic Counselor

## 2020-02-22 ENCOUNTER — Encounter: Payer: No Typology Code available for payment source | Admitting: Family Medicine

## 2020-02-22 DIAGNOSIS — Z0289 Encounter for other administrative examinations: Secondary | ICD-10-CM

## 2020-02-22 DIAGNOSIS — Z1379 Encounter for other screening for genetic and chromosomal anomalies: Secondary | ICD-10-CM | POA: Insufficient documentation

## 2020-02-22 NOTE — Progress Notes (Signed)
Left message to follow up from [facility] and assess navigation needs.   

## 2020-02-22 NOTE — Telephone Encounter (Signed)
Revealed negative genetic testing.  Discussed that we do not know why she has breast cancer or why there is cancer in the family.  It is possible that there could also be a mutation in a different gene that we are not testing, or our current technology may not be able detect certain mutations.  It will therefore be important for her to keep in contact with genetics to keep up with whether additional testing may be appropriate in the future.

## 2020-02-22 NOTE — Progress Notes (Signed)
HPI:  Ms. Nugent was previously seen in the [facility] due to a personal and family history of breast cancer and concerns regarding a hereditary predisposition to cancer. Please refer to our prior cancer genetics clinic note for more information regarding our discussion, assessment and recommendations, at the time. Ms. Thoennes recent genetic test results were disclosed to her, as were recommendations warranted by these results. These results and recommendations are discussed in more detail below.  CANCER HISTORY:  Oncology History  Ductal carcinoma in situ (DCIS) of left breast  02/08/2020 Initial Diagnosis   Screening mammogram detected left breast calcifications, 2.9cm. Biopsy showed DCIS with calcifications, low grade, ER+ 70%, PR+ 90%.   02/20/2020 Genetic Testing   Negative genetic testing:  No pathogenic variants detected on the [facility] Breast Cancer STAT or Multi-Cancer Panels. The report date is 02/20/2020.  The Breast Cancer STAT Panel offered by [facility] includes sequencing and deletion/duplication analysis for the following 9 genes:  ATM, BRCA1, BRCA2, CDH1, CHEK2, PALB2, PTEN, STK11 and TP53.  The Multi-Cancer Panel offered by [facility] includes sequencing and/or deletion duplication testing of the following 85 genes: AIP, ALK, APC, ATM, AXIN2,BAP1,  BARD1, BLM, BMPR1A, BRCA1, BRCA2, BRIP1, CASR, CDC73, CDH1, CDK4, CDKN1B, CDKN1C, CDKN2A (p14ARF), CDKN2A (p16INK4a), CEBPA, CHEK2, CTNNA1, DICER1, DIS3L2, EGFR (c.2369C>T, p.Thr790Met variant only), EPCAM (Deletion/duplication testing only), FH, FLCN, GATA2, GPC3, GREM1 (Promoter region deletion/duplication testing only), HOXB13 (c.251G>A, p.Gly84Glu), HRAS, KIT, MAX, MEN1, MET, MITF (c.952G>A, p.Glu318Lys variant only), MLH1, MSH2, MSH3, MSH6, MUTYH, NBN, NF1, NF2, NTHL1, PALB2, PDGFRA, PHOX2B, PMS2, POLD1, POLE, POT1, PRKAR1A, PTCH1, PTEN, RAD50, RAD51C, RAD51D, RB1, RECQL4, RET, RNF43, RUNX1, SDHAF2, SDHA (sequence changes  only), SDHB, SDHC, SDHD, SMAD4, SMARCA4, SMARCB1, SMARCE1, STK11, SUFU, TERC, TERT, TMEM127, TP53, TSC1, TSC2, VHL, WRN and WT1.     FAMILY HISTORY:  We obtained a detailed, 4-generation family history.  Significant diagnoses are listed below: Family History  Problem Relation Age of Onset  . Heart attack Mother   . Heart disease Mother   . Hypertension Mother   . Early death Father        unknown cause  . Hypertension Father   . Hypertension Sister   . Diabetes Brother   . Hypertension Brother   . Asthma Sister   . Hypertension Sister   . Hyperlipidemia Brother   . Hypertension Brother   . Mental illness Brother   . Heart disease Sister   . Hypertension Sister   . Arthritis Brother   . Diabetes Brother   . Hypertension Brother   . Breast cancer Cousin 60       paternal first cousin  . Throat cancer Sister 38       non-smoker  . Breast cancer Cousin        dx. in her 58s, paternal first cousin once removed  . Kidney cancer Other 53   Ms. Dominey has three children - one daughter who is 69 and fraternal twin sons who are 23 years old. She has four brothers and three sisters who are living and range in age from 18 to 94. She had a fourth sister who died when she was 67 from throat cancer diagnosed at age 33. Ms. Ley notes that her sister did not have a history of smoking. Ms. Polkowski also notes that she is related to her husband, who is her second cousin through her paternal side of the family and has a history of renal cell carcinoma diagnosed at age 37.  Ms. Hada mother died at the age of 38.  She has two maternal aunts and one maternal uncle, two of whom have died at ages older than 34. Her maternal grandmother died when she was younger than 42, and Ms. Macari is not sure how old her maternal grandfather was when he died.  Ms. Glaeser father died at the age 64 from an unknown cause. She had at least 10 paternal aunts and uncles. One uncle is currently living, and  the others are deceased. Some of these aunts and uncles died when they were younger than 60, most from either a heart attack or a stroke. One paternal first cousin died from breast cancer when she was 67. This cousin had a daughter who also died from breast cancer in her 4s. Ms. Benedicto paternal grandmother died at the age of 52, and she is not sure how old her paternal grandfather was when he died.  Ms. Merritts notes that many of her family members lived in [place] and did not have access to doctors. Therefore it is possible that there could be other family members who have had cancer that were not diagnosed.   Ms. Mertz is unaware of previous family history of genetic testing for hereditary cancer risks. Patient's maternal and paternal ancestors are of [place] descent. There is no reported Ashkenazi Jewish ancestry. There is known consanguinity - Ms. Patnaude's husband is her second cousin through her paternal side. She notes that her parents were not related to one another.  GENETIC TEST RESULTS: Genetic testing reported out on 02/20/2020 through the [facility] Breast Cancer STAT panel + Multi-Cancer panel. No pathogenic variants were detected.   The Breast Cancer STAT Panel offered by [facility] includes sequencing and deletion/duplication analysis for the following 9 genes:  ATM, BRCA1, BRCA2, CDH1, CHEK2, PALB2, PTEN, STK11 and TP53.  The Multi-Cancer Panel offered by [facility] includes sequencing and/or deletion duplication testing of the following 85 genes: AIP, ALK, APC, ATM, AXIN2,BAP1,  BARD1, BLM, BMPR1A, BRCA1, BRCA2, BRIP1, CASR, CDC73, CDH1, CDK4, CDKN1B, CDKN1C, CDKN2A (p14ARF), CDKN2A (p16INK4a), CEBPA, CHEK2, CTNNA1, DICER1, DIS3L2, EGFR (c.2369C>T, p.Thr790Met variant only), EPCAM (Deletion/duplication testing only), FH, FLCN, GATA2, GPC3, GREM1 (Promoter region deletion/duplication testing only), HOXB13 (c.251G>A, p.Gly84Glu), HRAS, KIT, MAX, MEN1, MET, MITF (c.952G>A, p.Glu318Lys variant  only), MLH1, MSH2, MSH3, MSH6, MUTYH, NBN, NF1, NF2, NTHL1, PALB2, PDGFRA, PHOX2B, PMS2, POLD1, POLE, POT1, PRKAR1A, PTCH1, PTEN, RAD50, RAD51C, RAD51D, RB1, RECQL4, RET, RNF43, RUNX1, SDHAF2, SDHA (sequence changes only), SDHB, SDHC, SDHD, SMAD4, SMARCA4, SMARCB1, SMARCE1, STK11, SUFU, TERC, TERT, TMEM127, TP53, TSC1, TSC2, VHL, WRN and WT1. The test report will be scanned into EPIC and located under the Molecular Pathology section of the Results Review tab.  A portion of the result report is included below for reference.     We discussed with Ms. Fultz that because current genetic testing is not perfect, it is possible there may be a gene mutation in one of these genes that current testing cannot detect, but that chance is small.  We also discussed, that there could be another gene that has not yet been discovered, or that we have not yet tested, that is responsible for the cancer diagnoses in the family. It is also possible there is a hereditary cause for the cancer in the family that Ms. Faulcon did not inherit and therefore was not identified in her testing.  Therefore, it is important to remain in touch with cancer genetics in the future so that we can continue to offer  Ms. Medine the most up to date genetic testing.    CANCER SCREENING RECOMMENDATIONS: Ms. Douse test result is considered negative (normal).  This means that we have not identified a hereditary cause for her personal and family history of cancer at this time. Most cancers happen by chance and this negative test suggests that her personal and family of cancer may fall into this category.    While reassuring, this does not definitively rule out a hereditary predisposition to cancer. It is still possible that there could be genetic mutations that are undetectable by current technology. There could be genetic mutations in genes that have not been tested or identified to increase cancer risk.  Therefore, it is recommended she continue  to follow the cancer management and screening guidelines provided by her oncology and primary healthcare providers.   An individual's cancer risk and medical management are not determined by genetic test results alone. Overall cancer risk assessment incorporates additional factors, including personal medical history, family history, and any available genetic information that may result in a personalized plan for cancer prevention and surveillance.  RECOMMENDATIONS FOR FAMILY MEMBERS:  Individuals in this family might be at some increased risk of developing cancer, over the general population risk, simply due to the family history of cancer.  We recommended women in this family have a yearly mammogram beginning at age 36, or 97 years younger than the earliest onset of cancer, an annual clinical breast exam, and perform monthly breast self-exams. Women in this family should also have a gynecological exam as recommended by their primary provider. All family members should have a colonoscopy by age 51.  FOLLOW-UP: Lastly, we discussed with Ms. Boyland that cancer genetics is a rapidly advancing field and it is possible that new genetic tests will be appropriate for her and/or her family members in the future. We encouraged her to remain in contact with cancer genetics on an annual basis so we can update her personal and family histories and let her know of advances in cancer genetics that may benefit this family.   Our contact number was provided. Ms. Sakuma questions were answered to her satisfaction, and she knows she is welcome to call us at anytime with additional questions or concerns.   Clint Guy, MS, [facility] Genetic Counselor [email or web address] Phone: [phone]

## 2020-02-27 ENCOUNTER — Encounter: Payer: Self-pay | Admitting: *Deleted

## 2020-02-27 ENCOUNTER — Other Ambulatory Visit: Payer: Self-pay | Admitting: General Surgery

## 2020-02-27 DIAGNOSIS — D0512 Intraductal carcinoma in situ of left breast: Secondary | ICD-10-CM

## 2020-02-28 ENCOUNTER — Other Ambulatory Visit: Payer: Self-pay | Admitting: General Surgery

## 2020-02-28 ENCOUNTER — Encounter: Payer: Self-pay | Admitting: *Deleted

## 2020-02-28 DIAGNOSIS — D0512 Intraductal carcinoma in situ of left breast: Secondary | ICD-10-CM

## 2020-02-29 ENCOUNTER — Telehealth: Payer: Self-pay | Admitting: Hematology and Oncology

## 2020-02-29 NOTE — Telephone Encounter (Signed)
Scheduled appt per 4/21 sch message - pt is aware of appt date and time   

## 2020-03-01 ENCOUNTER — Encounter: Payer: Self-pay | Admitting: *Deleted

## 2020-03-05 ENCOUNTER — Other Ambulatory Visit: Payer: Self-pay

## 2020-03-05 ENCOUNTER — Encounter (HOSPITAL_BASED_OUTPATIENT_CLINIC_OR_DEPARTMENT_OTHER): Payer: Self-pay | Admitting: General Surgery

## 2020-03-08 ENCOUNTER — Other Ambulatory Visit (HOSPITAL_COMMUNITY)
Admission: RE | Admit: 2020-03-08 | Discharge: 2020-03-08 | Disposition: A | Discharge status: Home / Self Care | Payer: No Typology Code available for payment source | Source: Ambulatory Visit | Attending: General Surgery | Admitting: General Surgery

## 2020-03-08 DIAGNOSIS — Z01812 Encounter for preprocedural laboratory examination: Secondary | ICD-10-CM | POA: Diagnosis not present

## 2020-03-08 DIAGNOSIS — Z20822 Contact with and (suspected) exposure to covid-19: Secondary | ICD-10-CM | POA: Insufficient documentation

## 2020-03-08 LAB — SARS CORONAVIRUS 2 (TAT 6-24 HRS): SARS Coronavirus 2: NEGATIVE

## 2020-03-08 MED ORDER — ENSURE PRE-SURGERY PO LIQD: 296.0000 mL | Freq: Once | ORAL | Status: DC

## 2020-03-08 NOTE — Progress Notes (Signed)

## 2020-03-11 ENCOUNTER — Ambulatory Visit
Admission: RE | Admit: 2020-03-11 | Discharge: 2020-03-11 | Disposition: A | Discharge status: Home / Self Care | Payer: No Typology Code available for payment source | Source: Ambulatory Visit | Attending: General Surgery | Admitting: General Surgery

## 2020-03-11 ENCOUNTER — Other Ambulatory Visit: Payer: Self-pay

## 2020-03-11 DIAGNOSIS — D0512 Intraductal carcinoma in situ of left breast: Secondary | ICD-10-CM

## 2020-03-11 IMAGING — MG MM PLC BREAST LOC DEV 1ST LESION INC MAMMO GUIDE*L*
8 series · 8 of 8 positions shown · non-contrast
Comparison: Previous exam(s).

CLINICAL DATA: Patient presents for mammographically guided
radioactive seed localization, prior to surgical excision, of 2 left
breast lesions, an area of DCIS localized with a coil clip and an
area of flat epithelial atypia with calcifications localized with an
X shaped clip.

EXAM:
MAMMOGRAPHIC GUIDED RADIOACTIVE SEED LOCALIZATION OF THE LEFT
BREAST: 2 LESIONS LOCALIZED

[L CC (1 of 4)]
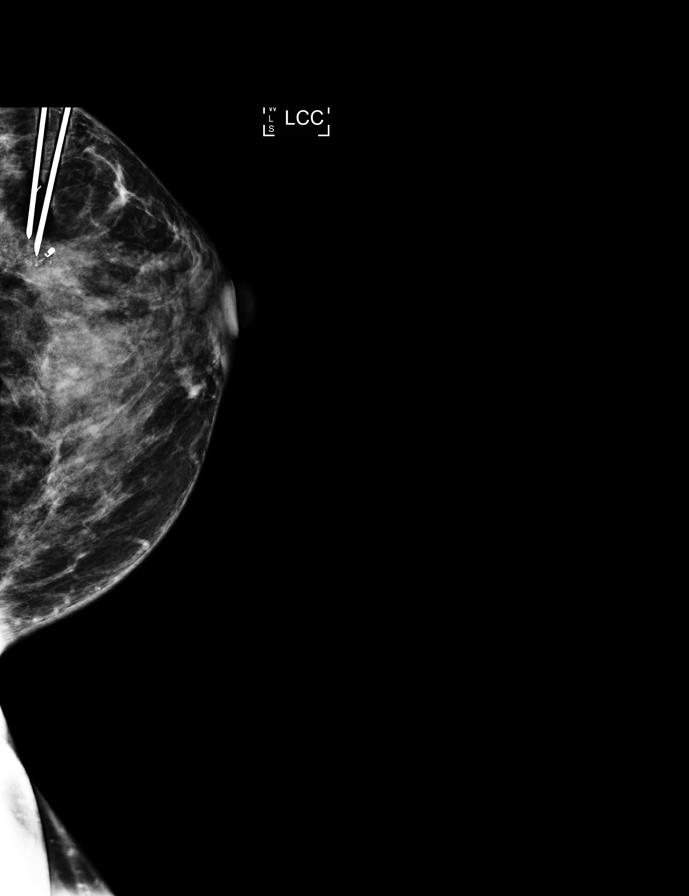

[L CC (2 of 4)]
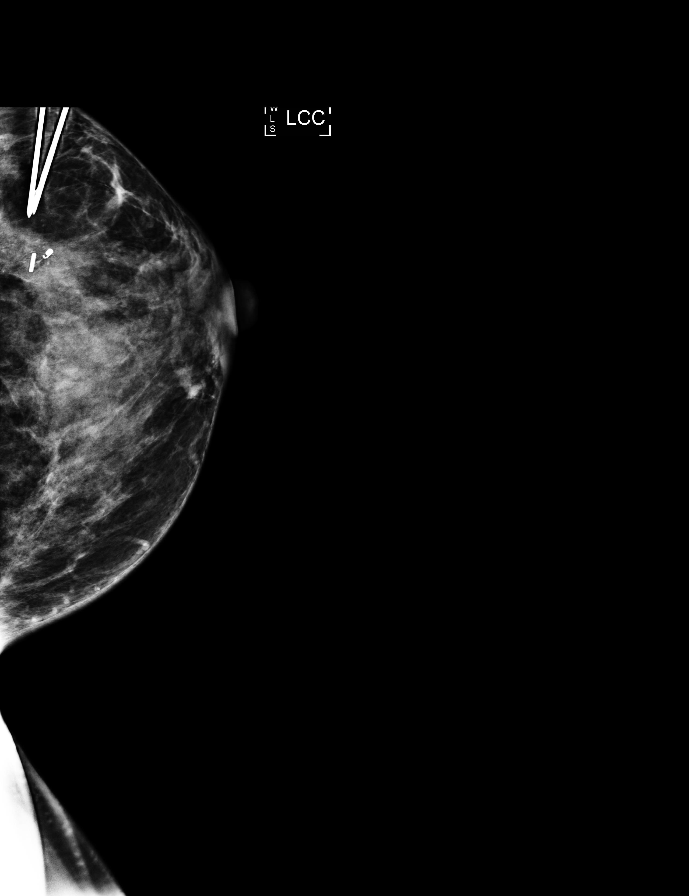

[L LM (1 of 4)]
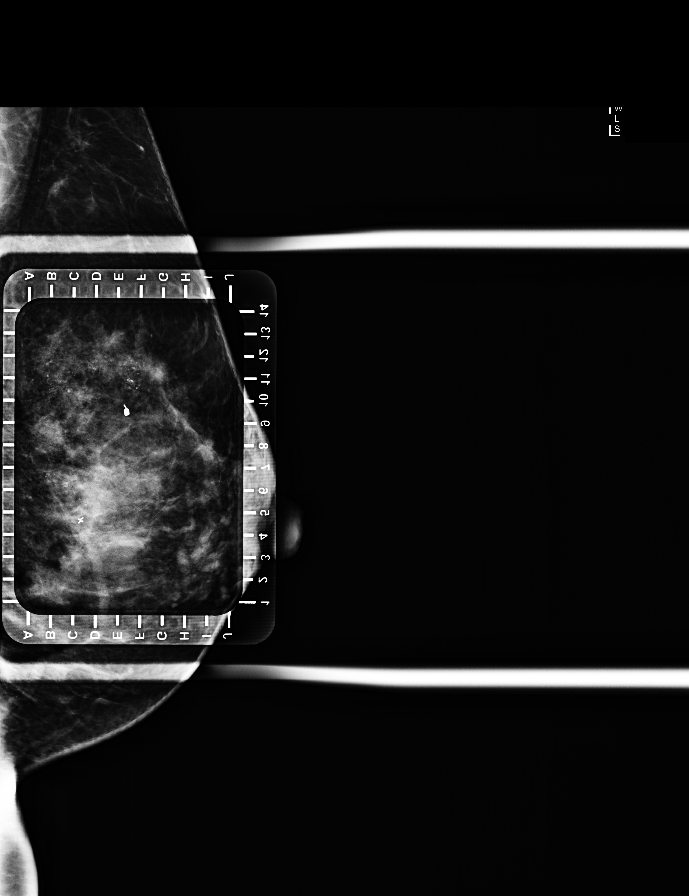

[L CC (3 of 4)]
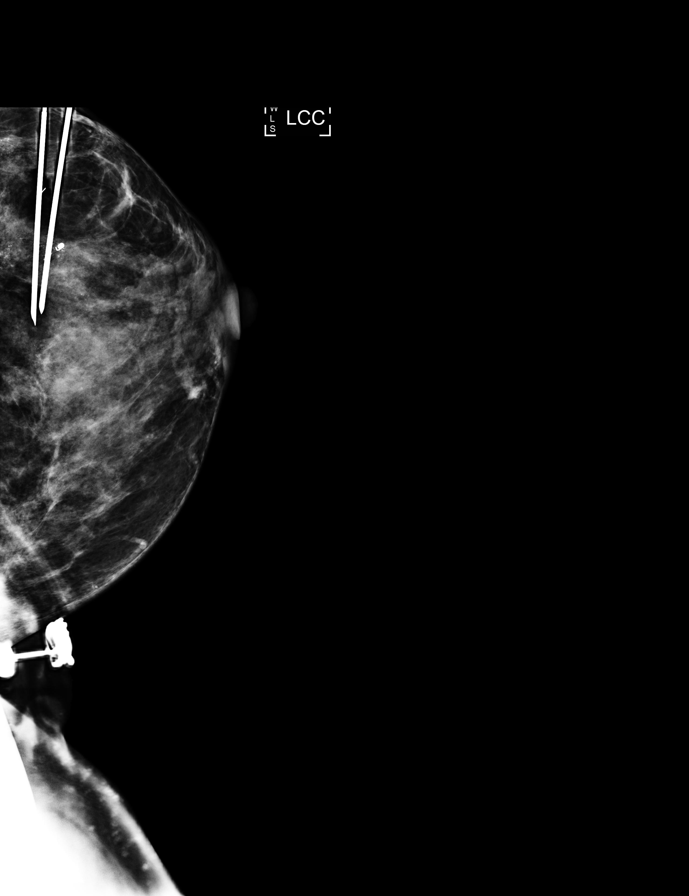

[L LM (2 of 4)]
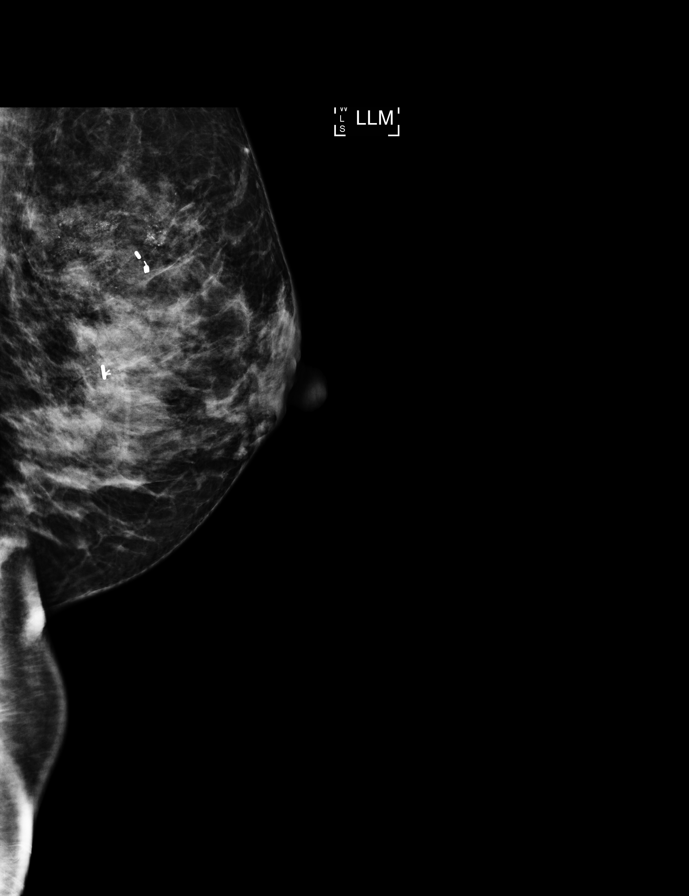

[L CC (4 of 4)]
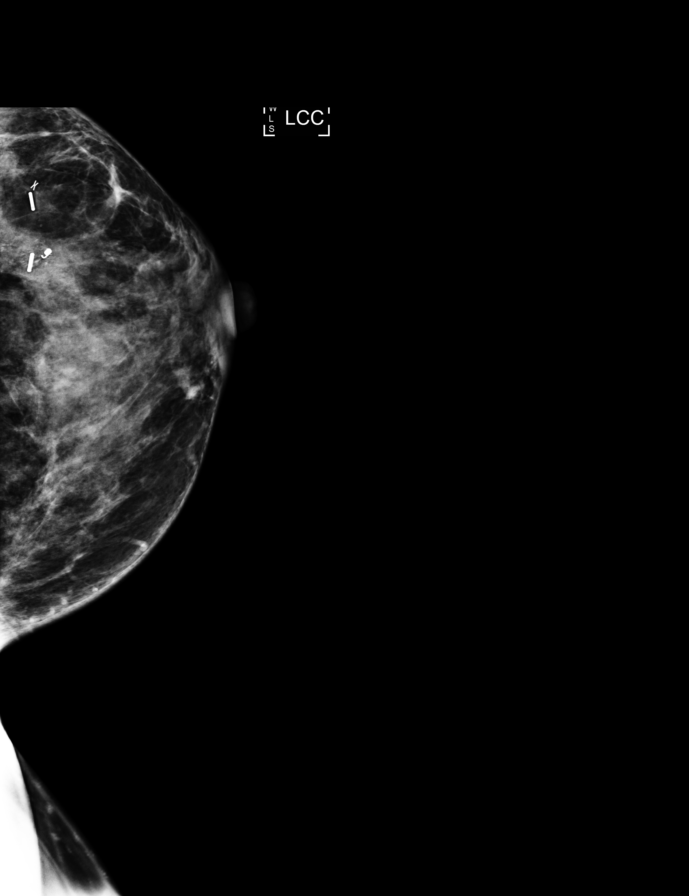

[L LM (3 of 4)]
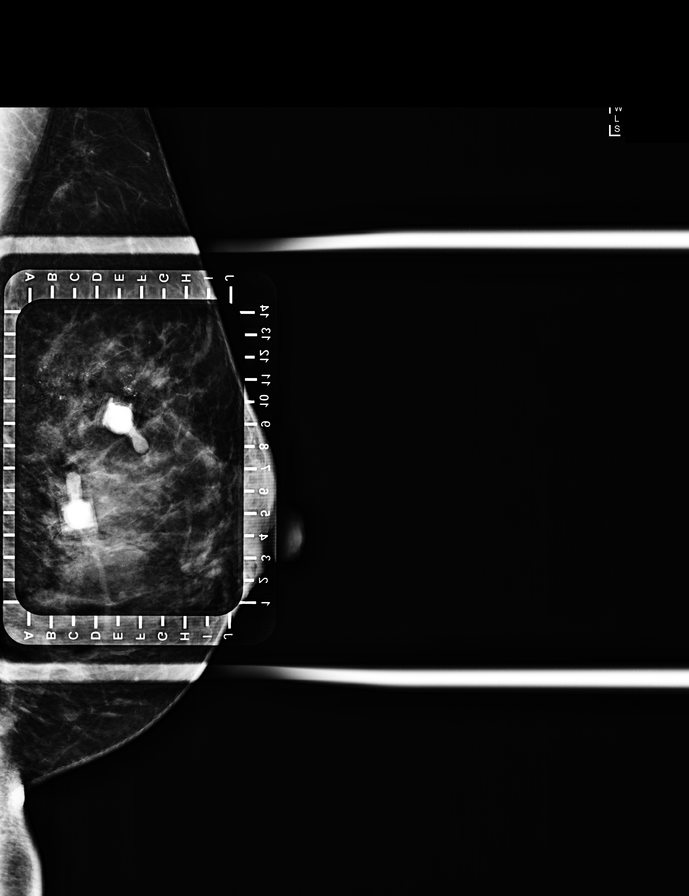

[L LM (4 of 4)]
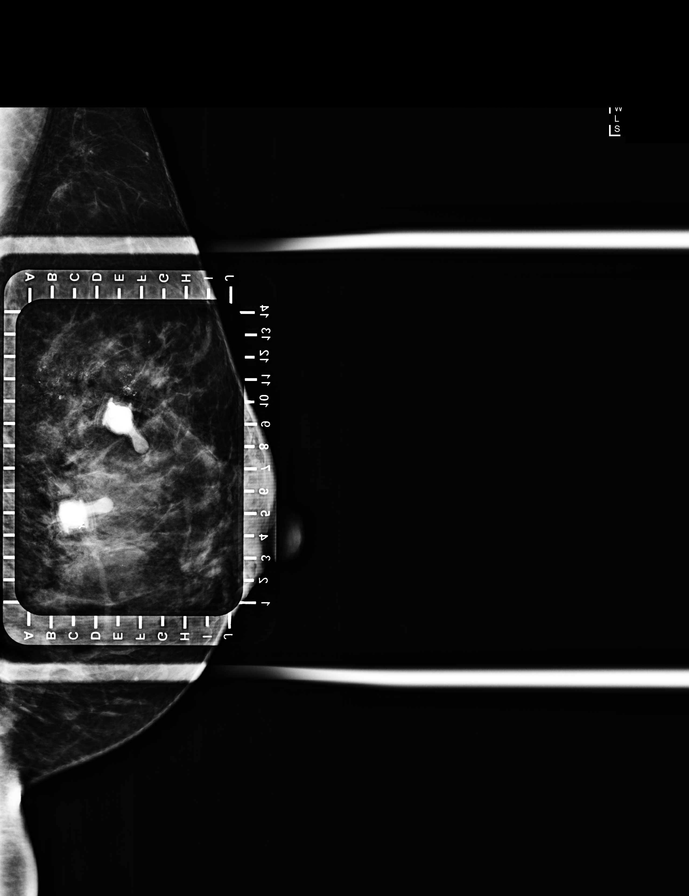

[8 of 8 positions shown; findings below may reference images not displayed]

FINDINGS: Patient presents for radioactive seed localization prior to surgical
excision. I met with the patient and we discussed the procedure of
seed localization including benefits and alternatives. We discussed
the high likelihood of a successful procedure. We discussed the
risks of the procedure including infection, bleeding, tissue injury
and further surgery. We discussed the low dose of radioactivity
involved in the procedure. Informed, written consent was given.

The usual time-out protocol was performed immediately prior to the
procedure.

Coil shaped clip:

Using mammographic guidance, sterile technique, 1% lidocaine and an
[A5] radioactive seed, the coil shaped biopsy clip was localized
using a lateral approach. The follow-up mammogram images confirm the
seed in the expected location and were marked for Dr. JOSHJAX.

Follow-up survey of the patient confirms presence of the radioactive
seed.

Order number of [A5] seed:  [PHONE_NUMBER].

Total activity:  0.253 millicuries reference Date: [DATE]

X shaped clip:

Using mammographic guidance, sterile technique, 1% lidocaine and an
[A5] radioactive seed, the X shaped biopsy clip was localized using
a lateral approach. The follow-up mammogram images confirm the seed
in the expected location and were marked for Dr. JOSHJAX.

Follow-up survey of the patient confirms presence of the radioactive
seed.

Order number of [A5] seed:  [PHONE_NUMBER].

Total activity:  0.253 millicuries reference Date: [DATE]

The patient tolerated the procedure well and was released from the
[REDACTED]. She was given instructions regarding seed removal.
IMPRESSION: Radioactive seed localization of 2 lesions in the breast. No
apparent complications.

## 2020-03-12 ENCOUNTER — Other Ambulatory Visit: Payer: Self-pay

## 2020-03-12 ENCOUNTER — Ambulatory Visit (HOSPITAL_BASED_OUTPATIENT_CLINIC_OR_DEPARTMENT_OTHER): Payer: No Typology Code available for payment source | Admitting: Anesthesiology

## 2020-03-12 ENCOUNTER — Ambulatory Visit
Admission: RE | Admit: 2020-03-12 | Discharge: 2020-03-12 | Disposition: A | Discharge status: Home / Self Care | Payer: No Typology Code available for payment source | Source: Ambulatory Visit | Attending: General Surgery | Admitting: General Surgery

## 2020-03-12 ENCOUNTER — Encounter (HOSPITAL_BASED_OUTPATIENT_CLINIC_OR_DEPARTMENT_OTHER)
Admission: RE | Disposition: A | Discharge status: Home / Self Care | Payer: Self-pay | Source: Home / Self Care | Attending: General Surgery

## 2020-03-12 ENCOUNTER — Ambulatory Visit (HOSPITAL_BASED_OUTPATIENT_CLINIC_OR_DEPARTMENT_OTHER)
Admission: RE | Admit: 2020-03-12 | Discharge: 2020-03-12 | Disposition: A | Discharge status: Home / Self Care | Payer: No Typology Code available for payment source | Attending: General Surgery | Admitting: General Surgery

## 2020-03-12 ENCOUNTER — Encounter (HOSPITAL_BASED_OUTPATIENT_CLINIC_OR_DEPARTMENT_OTHER): Payer: Self-pay | Admitting: General Surgery

## 2020-03-12 DIAGNOSIS — Z79899 Other long term (current) drug therapy: Secondary | ICD-10-CM | POA: Diagnosis not present

## 2020-03-12 DIAGNOSIS — D0512 Intraductal carcinoma in situ of left breast: Secondary | ICD-10-CM | POA: Diagnosis not present

## 2020-03-12 DIAGNOSIS — I10 Essential (primary) hypertension: Secondary | ICD-10-CM | POA: Diagnosis not present

## 2020-03-12 DIAGNOSIS — F329 Major depressive disorder, single episode, unspecified: Secondary | ICD-10-CM | POA: Insufficient documentation

## 2020-03-12 HISTORY — PX: BREAST LUMPECTOMY: SHX2

## 2020-03-12 HISTORY — PX: BREAST LUMPECTOMY WITH RADIOACTIVE SEED LOCALIZATION: SHX6424

## 2020-03-12 LAB — POCT PREGNANCY, URINE: Preg Test, Ur: NEGATIVE

## 2020-03-12 IMAGING — DX MM BREAST SURGICAL SPECIMEN
1 series · 2 of 2 positions shown · non-contrast
Comparison: Previous exam(s).

CLINICAL DATA: Specimen imaging following surgical excision 2 left
breast lesions following radioactive seed localization.

EXAM:
SPECIMEN RADIOGRAPHS OF THE LEFT BREAST

[Series 1: specimen digital x-ray · left · 0.10mm/px · 2 of 2 slices shown]
[im 1/2]
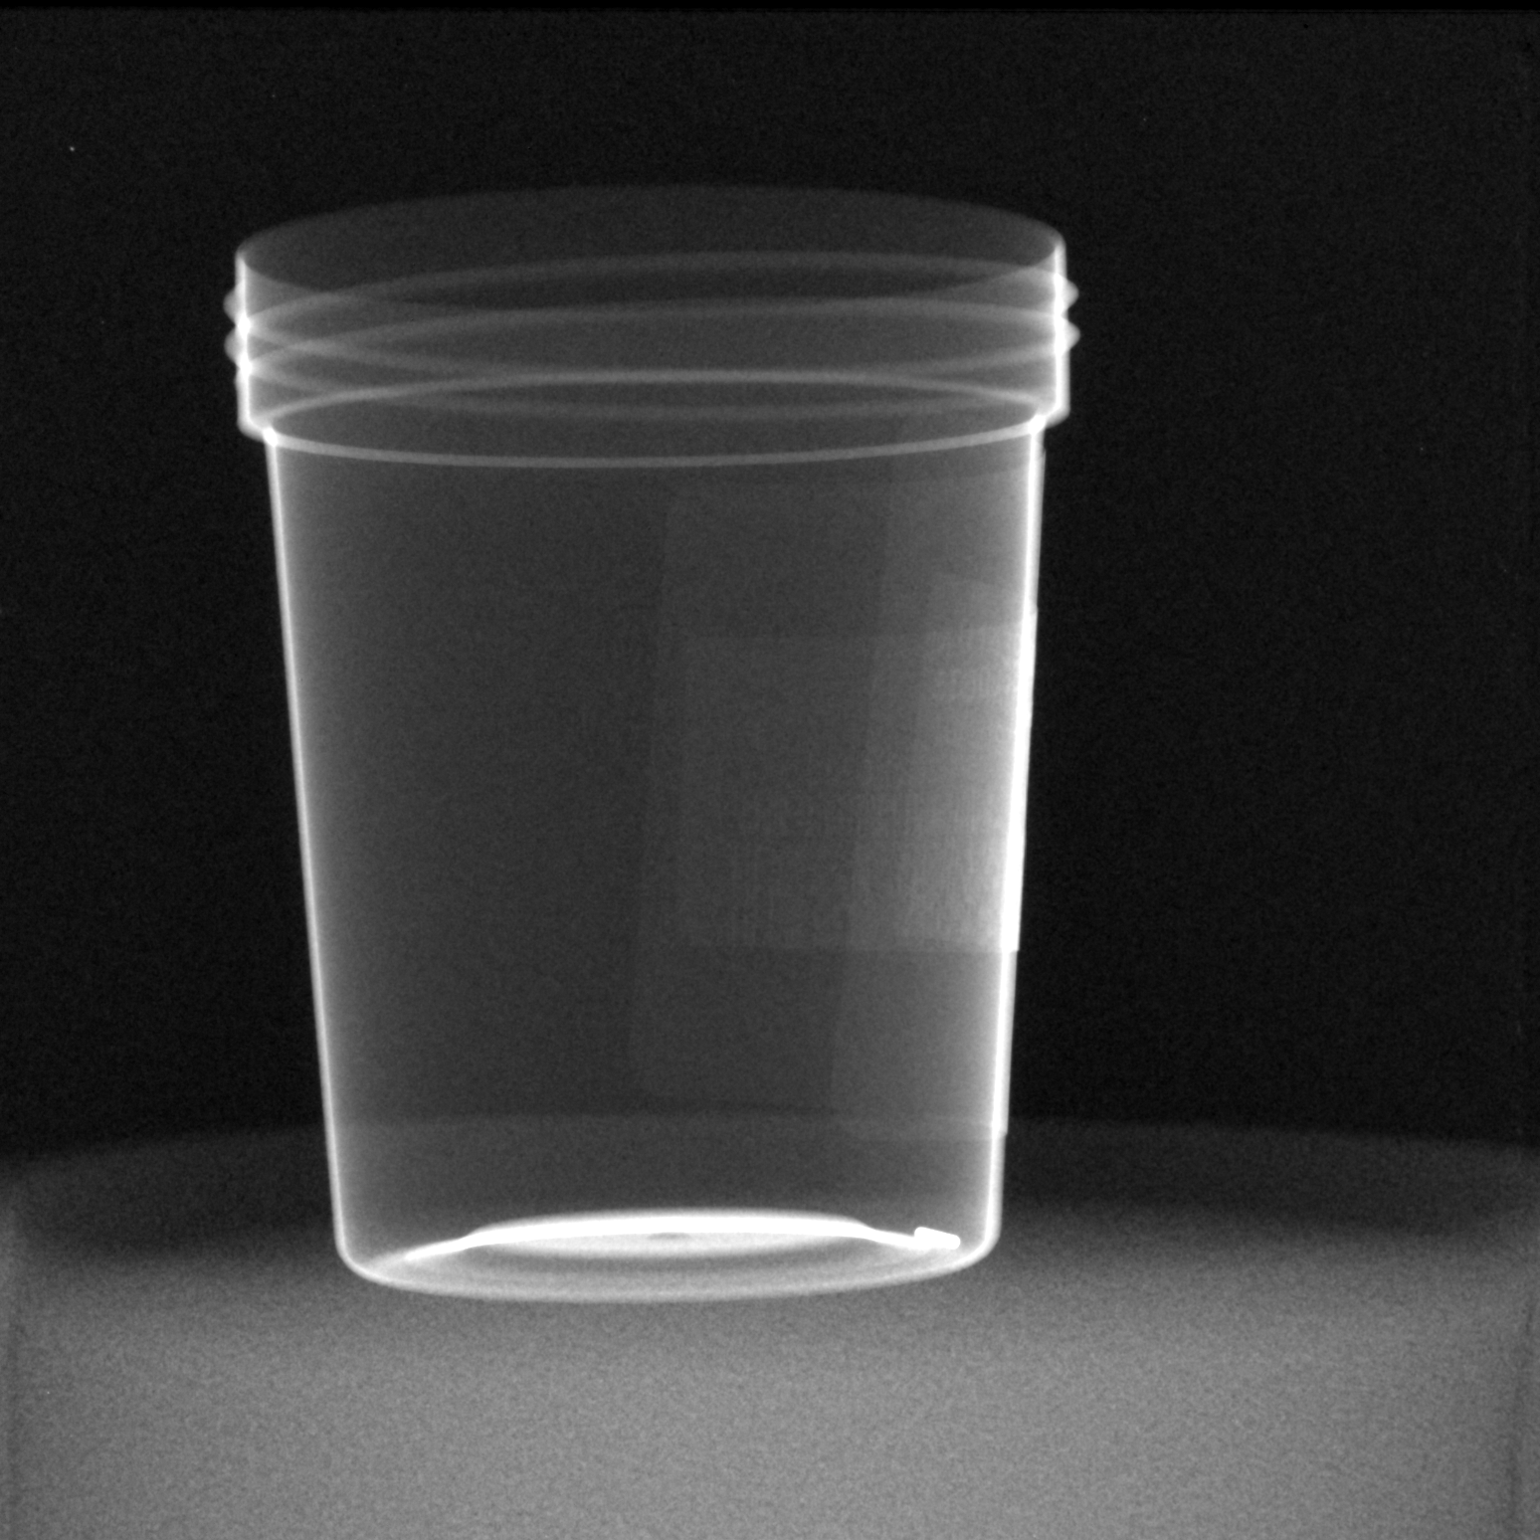
[im 2/2]
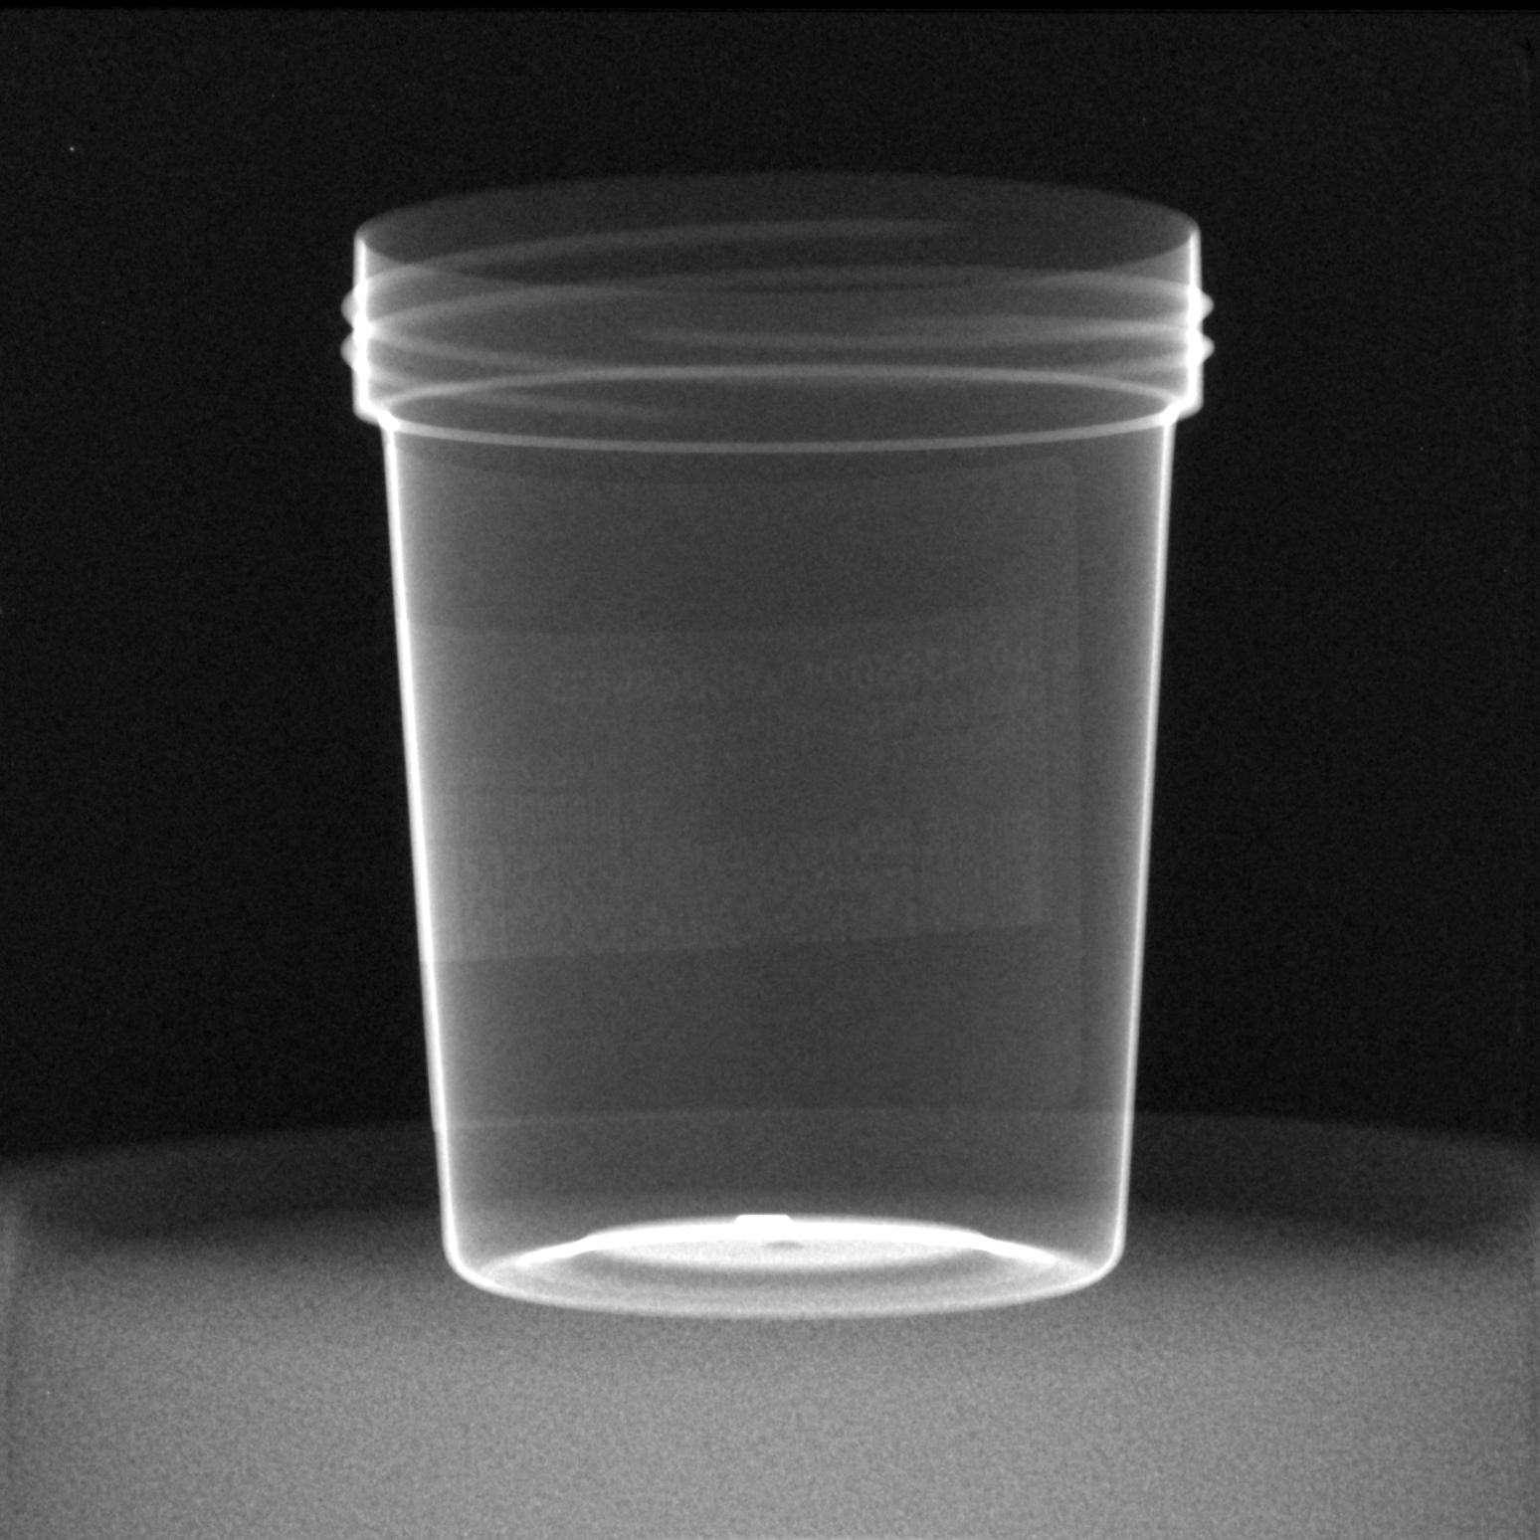

[2 of 2 positions shown; findings below may reference images not displayed]

FINDINGS: Status post excision of the left breast. The radioactive seed
localizing the coil shaped clip is within the specimen. The second
seed was submitted separately. The ribbon shaped clip was not within
the specimen or submitted separately, but was felt to have displaced
from the specimen during extraction.
IMPRESSION: Specimen radiographs of the left breast.

## 2020-03-12 IMAGING — DX MM BREAST SURGICAL SPECIMEN
1 series · 2 of 2 positions shown · non-contrast
Comparison: Previous exam(s).

CLINICAL DATA: Specimen imaging following surgical excision 2 left
breast lesions following radioactive seed localization.

EXAM:
SPECIMEN RADIOGRAPHS OF THE LEFT BREAST

[Series 1: specimen digital x-ray · left · 0.10mm/px · 2 of 2 slices shown]
[im 1/2]
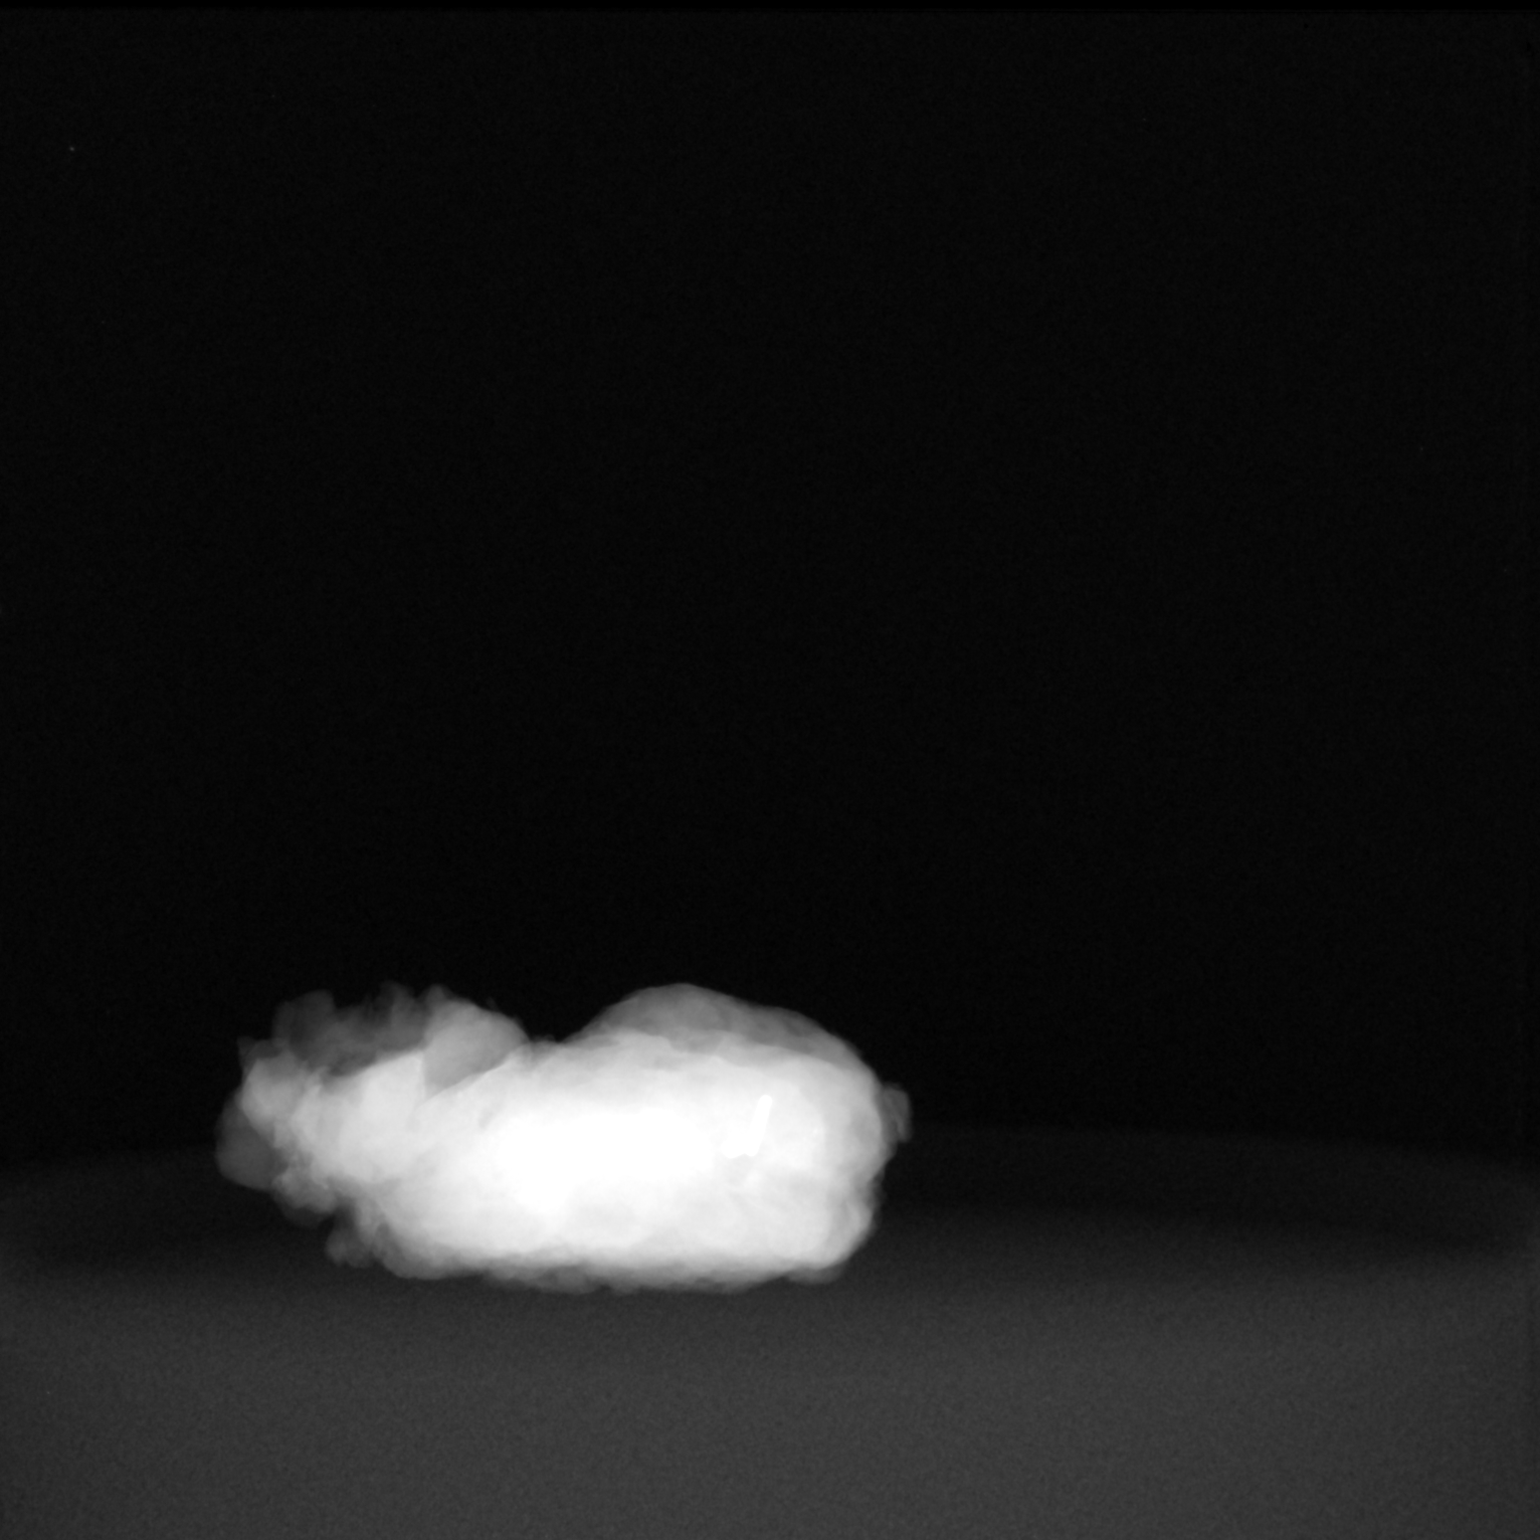
[im 2/2]
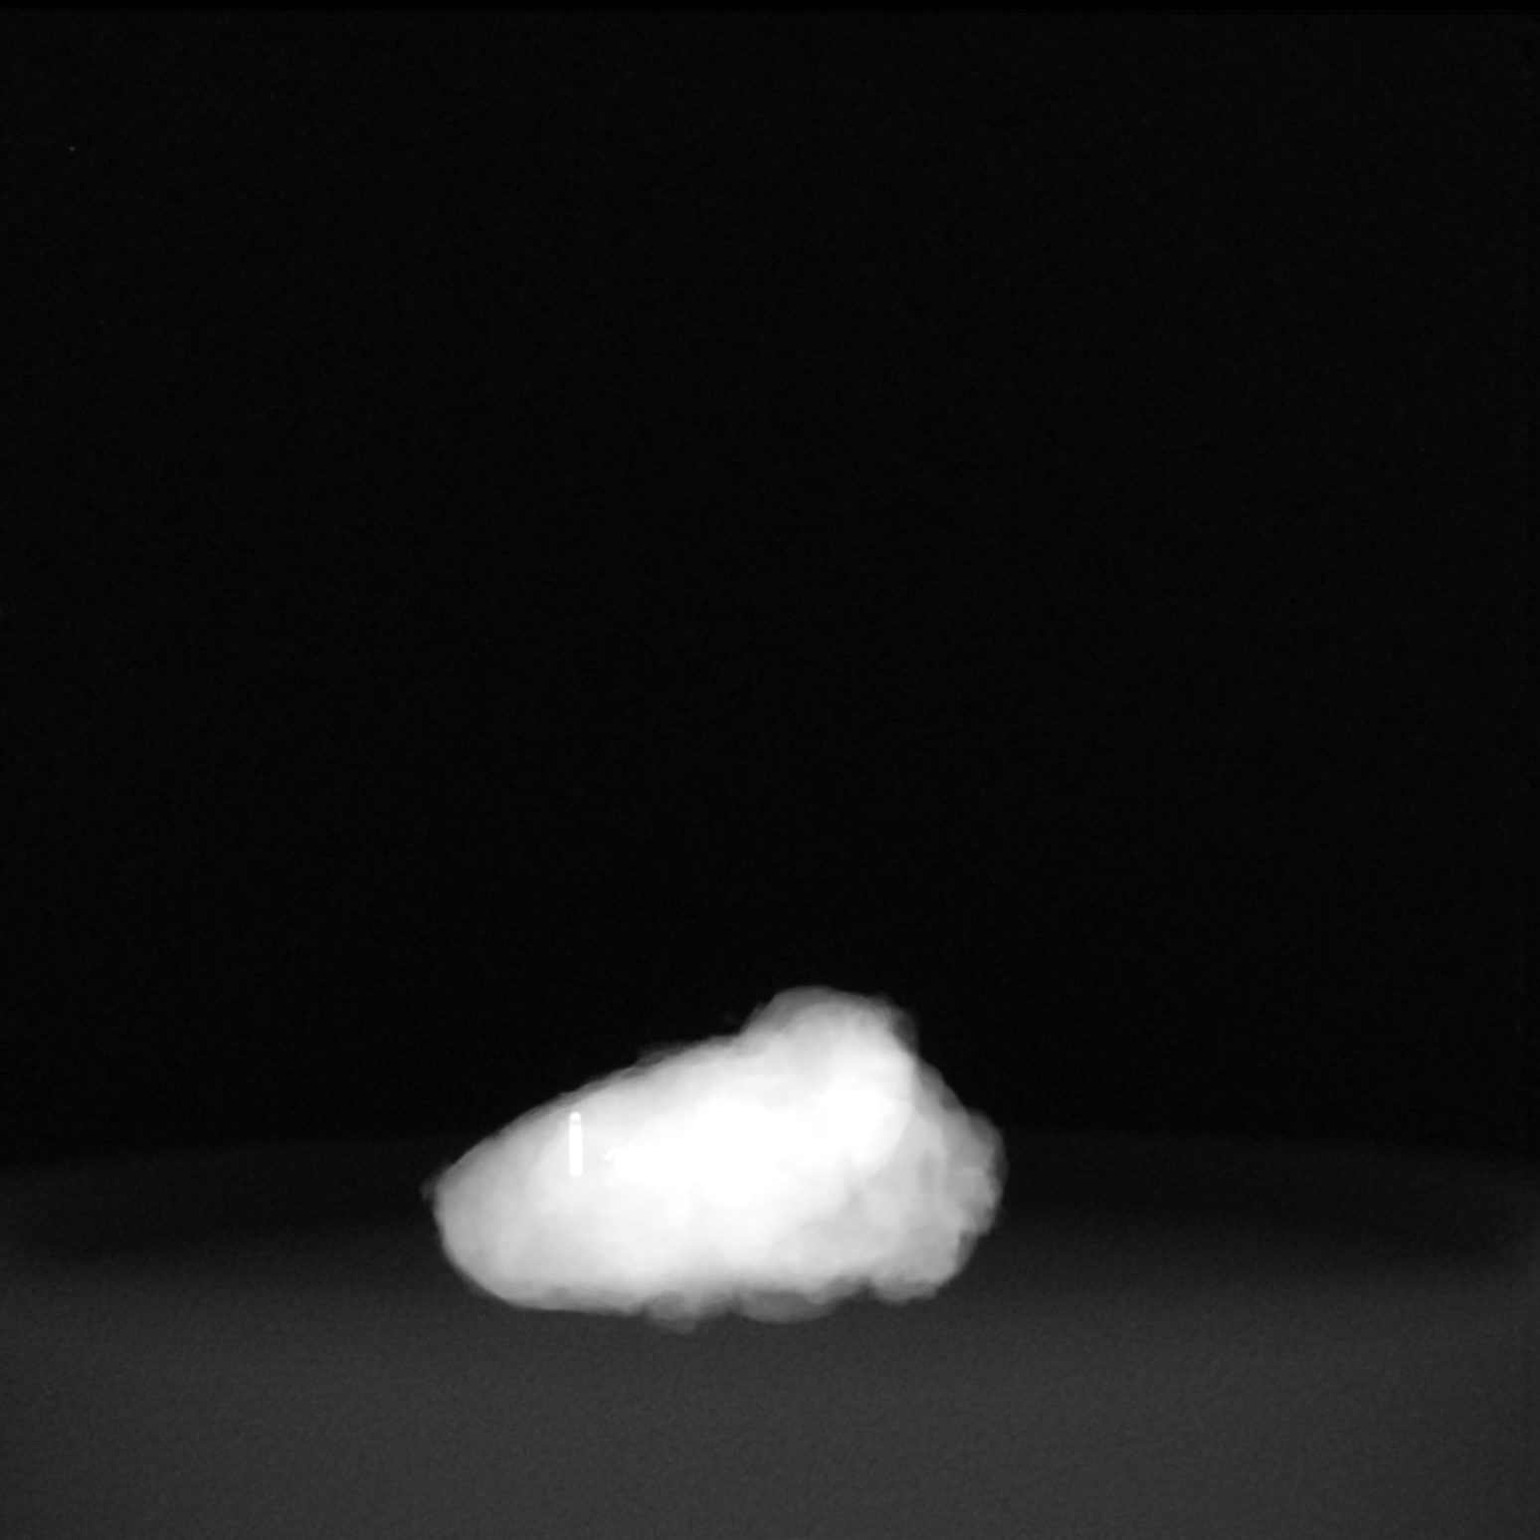

[2 of 2 positions shown; findings below may reference images not displayed]

FINDINGS: Status post excision of the left breast. The radioactive seed
localizing the coil shaped clip is within the specimen. The second
seed was submitted separately. The ribbon shaped clip was not within
the specimen or submitted separately, but was felt to have displaced
from the specimen during extraction.
IMPRESSION: Specimen radiographs of the left breast.

## 2020-03-12 SURGERY — BREAST LUMPECTOMY WITH RADIOACTIVE SEED LOCALIZATION
Anesthesia: General | Site: Breast | Laterality: Left

## 2020-03-12 MED ORDER — FENTANYL CITRATE (PF) 100 MCG/2ML IJ SOLN: 50.0000 ug | INTRAMUSCULAR | Status: DC | PRN

## 2020-03-12 MED ORDER — TRAMADOL HCL 50 MG PO TABS: 50.0000 mg | ORAL_TABLET | Freq: Four times a day (QID) | ORAL | 0 refills | Status: DC | PRN

## 2020-03-12 MED ORDER — FENTANYL CITRATE (PF) 100 MCG/2ML IJ SOLN
INTRAMUSCULAR | Status: DC | PRN
  Administered 2020-03-12: 25 ug via INTRAVENOUS
  Administered 2020-03-12: 100 ug via INTRAVENOUS

## 2020-03-12 MED ORDER — MEPERIDINE HCL 25 MG/ML IJ SOLN: 6.2500 mg | INTRAMUSCULAR | Status: DC | PRN

## 2020-03-12 MED ORDER — OXYCODONE HCL 5 MG/5ML PO SOLN: 5.0000 mg | Freq: Once | ORAL | Status: DC | PRN

## 2020-03-12 MED ORDER — PROMETHAZINE HCL 25 MG/ML IJ SOLN: 6.2500 mg | INTRAMUSCULAR | Status: DC | PRN

## 2020-03-12 MED ORDER — MIDAZOLAM HCL 2 MG/2ML IJ SOLN: 1.0000 mg | INTRAMUSCULAR | Status: DC | PRN

## 2020-03-12 MED ORDER — CEFAZOLIN SODIUM-DEXTROSE 2-4 GM/100ML-% IV SOLN
INTRAVENOUS | Status: AC
  Filled 2020-03-12: qty 100

## 2020-03-12 MED ORDER — PROPOFOL 10 MG/ML IV BOLUS
INTRAVENOUS | Status: AC
  Filled 2020-03-12: qty 20

## 2020-03-12 MED ORDER — KETOROLAC TROMETHAMINE 15 MG/ML IJ SOLN
15.0000 mg | INTRAMUSCULAR | Status: AC
  Administered 2020-03-12: 15 mg via INTRAVENOUS

## 2020-03-12 MED ORDER — ACETAMINOPHEN 500 MG PO TABS
ORAL_TABLET | ORAL | Status: AC
  Filled 2020-03-12: qty 2

## 2020-03-12 MED ORDER — KETOROLAC TROMETHAMINE 15 MG/ML IJ SOLN
INTRAMUSCULAR | Status: AC
  Filled 2020-03-12: qty 1

## 2020-03-12 MED ORDER — OXYCODONE HCL 5 MG PO TABS: 5.0000 mg | ORAL_TABLET | Freq: Once | ORAL | Status: DC | PRN

## 2020-03-12 MED ORDER — HYDROMORPHONE HCL 1 MG/ML IJ SOLN
INTRAMUSCULAR | Status: AC
  Filled 2020-03-12: qty 0.5

## 2020-03-12 MED ORDER — FENTANYL CITRATE (PF) 100 MCG/2ML IJ SOLN
INTRAMUSCULAR | Status: AC
  Filled 2020-03-12: qty 2

## 2020-03-12 MED ORDER — LIDOCAINE 2% (20 MG/ML) 5 ML SYRINGE
INTRAMUSCULAR | Status: AC
  Filled 2020-03-12: qty 5

## 2020-03-12 MED ORDER — ONDANSETRON HCL 4 MG/2ML IJ SOLN
INTRAMUSCULAR | Status: AC
  Filled 2020-03-12: qty 2

## 2020-03-12 MED ORDER — LACTATED RINGERS IV SOLN: INTRAVENOUS | Status: DC

## 2020-03-12 MED ORDER — MIDAZOLAM HCL 2 MG/2ML IJ SOLN
INTRAMUSCULAR | Status: AC
  Filled 2020-03-12: qty 2

## 2020-03-12 MED ORDER — GABAPENTIN 100 MG PO CAPS
ORAL_CAPSULE | ORAL | Status: AC
  Filled 2020-03-12: qty 1

## 2020-03-12 MED ORDER — PROPOFOL 10 MG/ML IV BOLUS
INTRAVENOUS | Status: DC | PRN
  Administered 2020-03-12: 200 mg via INTRAVENOUS

## 2020-03-12 MED ORDER — DEXAMETHASONE SODIUM PHOSPHATE 10 MG/ML IJ SOLN
INTRAMUSCULAR | Status: AC
  Filled 2020-03-12: qty 1

## 2020-03-12 MED ORDER — HYDROMORPHONE HCL 1 MG/ML IJ SOLN
0.2500 mg | INTRAMUSCULAR | Status: DC | PRN
  Administered 2020-03-12: 0.25 mg via INTRAVENOUS

## 2020-03-12 MED ORDER — SUCCINYLCHOLINE CHLORIDE 200 MG/10ML IV SOSY
PREFILLED_SYRINGE | INTRAVENOUS | Status: AC
  Filled 2020-03-12: qty 10

## 2020-03-12 MED ORDER — ONDANSETRON HCL 4 MG/2ML IJ SOLN
INTRAMUSCULAR | Status: DC | PRN
  Administered 2020-03-12: 4 mg via INTRAVENOUS

## 2020-03-12 MED ORDER — ACETAMINOPHEN 500 MG PO TABS
1000.0000 mg | ORAL_TABLET | ORAL | Status: AC
  Administered 2020-03-12: 1000 mg via ORAL

## 2020-03-12 MED ORDER — GABAPENTIN 100 MG PO CAPS
100.0000 mg | ORAL_CAPSULE | ORAL | Status: AC
  Administered 2020-03-12: 100 mg via ORAL

## 2020-03-12 MED ORDER — MIDAZOLAM HCL 5 MG/5ML IJ SOLN
INTRAMUSCULAR | Status: DC | PRN
  Administered 2020-03-12: 2 mg via INTRAVENOUS

## 2020-03-12 MED ORDER — DEXAMETHASONE SODIUM PHOSPHATE 4 MG/ML IJ SOLN
INTRAMUSCULAR | Status: DC | PRN
  Administered 2020-03-12: 10 mg via INTRAVENOUS

## 2020-03-12 MED ORDER — LIDOCAINE 2% (20 MG/ML) 5 ML SYRINGE
INTRAMUSCULAR | Status: DC | PRN
  Administered 2020-03-12: 100 mg via INTRAVENOUS

## 2020-03-12 MED ORDER — BUPIVACAINE HCL (PF) 0.25 % IJ SOLN
INTRAMUSCULAR | Status: DC | PRN
  Administered 2020-03-12: 10 mL

## 2020-03-12 MED ORDER — BUPIVACAINE HCL (PF) 0.25 % IJ SOLN
INTRAMUSCULAR | Status: AC
  Filled 2020-03-12: qty 30

## 2020-03-12 MED ORDER — CEFAZOLIN SODIUM-DEXTROSE 2-4 GM/100ML-% IV SOLN
2.0000 g | INTRAVENOUS | Status: AC
  Administered 2020-03-12: 2 g via INTRAVENOUS

## 2020-03-12 MED FILL — traMADol HCL 50 MG TABS: 50 | 2 days supply | Qty: 10 | Fill #0

## 2020-03-12 SURGICAL SUPPLY — 61 items
ADH SKN CLS APL DERMABOND .7 (GAUZE/BANDAGES/DRESSINGS) ×1
APL PRP STRL LF DISP 70% ISPRP (MISCELLANEOUS) ×1
APPLIER CLIP 9.375 MED OPEN (MISCELLANEOUS)
APR CLP MED 9.3 20 MLT OPN (MISCELLANEOUS)
BINDER BREAST LRG (GAUZE/BANDAGES/DRESSINGS) IMPLANT
BINDER BREAST MEDIUM (GAUZE/BANDAGES/DRESSINGS) ×1 IMPLANT
BINDER BREAST XLRG (GAUZE/BANDAGES/DRESSINGS) IMPLANT
BINDER BREAST XXLRG (GAUZE/BANDAGES/DRESSINGS) IMPLANT
BLADE SURG 15 STRL LF DISP TIS (BLADE) ×1 IMPLANT
BLADE SURG 15 STRL SS (BLADE) ×2
CANISTER SUC SOCK COL 7IN (MISCELLANEOUS) IMPLANT
CANISTER SUCT 1200ML W/VALVE (MISCELLANEOUS) IMPLANT
CHLORAPREP W/TINT 26 (MISCELLANEOUS) ×2 IMPLANT
CLIP APPLIE 9.375 MED OPEN (MISCELLANEOUS) IMPLANT
CLIP VESOCCLUDE SM WIDE 6/CT (CLIP) ×1 IMPLANT
COVER BACK TABLE 60X90IN (DRAPES) ×2 IMPLANT
COVER MAYO STAND STRL (DRAPES) ×2 IMPLANT
COVER PROBE W GEL 5X96 (DRAPES) ×2 IMPLANT
COVER WAND RF STERILE (DRAPES) IMPLANT
DECANTER SPIKE VIAL GLASS SM (MISCELLANEOUS) IMPLANT
DERMABOND ADVANCED (GAUZE/BANDAGES/DRESSINGS) ×1
DERMABOND ADVANCED .7 DNX12 (GAUZE/BANDAGES/DRESSINGS) ×1 IMPLANT
DRAPE LAPAROSCOPIC ABDOMINAL (DRAPES) ×2 IMPLANT
DRAPE UTILITY XL STRL (DRAPES) ×2 IMPLANT
DRSG TEGADERM 4X4.75 (GAUZE/BANDAGES/DRESSINGS) IMPLANT
ELECT COATED BLADE 2.86 ST (ELECTRODE) ×2 IMPLANT
ELECT REM PT RETURN 9FT ADLT (ELECTROSURGICAL) ×2
ELECTRODE REM PT RTRN 9FT ADLT (ELECTROSURGICAL) ×1 IMPLANT
GAUZE SPONGE 4X4 12PLY STRL LF (GAUZE/BANDAGES/DRESSINGS) IMPLANT
GLOVE BIO SURGEON STRL SZ7 (GLOVE) ×4 IMPLANT
GLOVE BIOGEL PI IND STRL 7.0 (GLOVE) IMPLANT
GLOVE BIOGEL PI IND STRL 7.5 (GLOVE) ×1 IMPLANT
GLOVE BIOGEL PI INDICATOR 7.0 (GLOVE) ×2
GLOVE BIOGEL PI INDICATOR 7.5 (GLOVE) ×1
GLOVE ECLIPSE 6.5 STRL STRAW (GLOVE) ×1 IMPLANT
GOWN STRL REUS W/ TWL LRG LVL3 (GOWN DISPOSABLE) ×2 IMPLANT
GOWN STRL REUS W/TWL LRG LVL3 (GOWN DISPOSABLE) ×4
HEMOSTAT ARISTA ABSORB 3G PWDR (HEMOSTASIS) IMPLANT
KIT MARKER MARGIN INK (KITS) ×2 IMPLANT
NDL HYPO 25X1 1.5 SAFETY (NEEDLE) ×1 IMPLANT
NEEDLE HYPO 25X1 1.5 SAFETY (NEEDLE) ×2 IMPLANT
NS IRRIG 1000ML POUR BTL (IV SOLUTION) ×1 IMPLANT
PENCIL SMOKE EVACUATOR (MISCELLANEOUS) ×2 IMPLANT
RETRACTOR ONETRAX LX 90X20 (MISCELLANEOUS) ×1 IMPLANT
SET BASIN DAY SURGERY F.S. (CUSTOM PROCEDURE TRAY) ×2 IMPLANT
SLEEVE SCD COMPRESS KNEE MED (MISCELLANEOUS) ×2 IMPLANT
SPONGE LAP 4X18 RFD (DISPOSABLE) ×2 IMPLANT
STRIP CLOSURE SKIN 1/2X4 (GAUZE/BANDAGES/DRESSINGS) ×2 IMPLANT
SUT MNCRL AB 4-0 PS2 18 (SUTURE) ×1 IMPLANT
SUT MON AB 5-0 PS2 18 (SUTURE) ×1 IMPLANT
SUT SILK 2 0 SH (SUTURE) IMPLANT
SUT VIC AB 2-0 SH 27 (SUTURE) ×4
SUT VIC AB 2-0 SH 27XBRD (SUTURE) ×1 IMPLANT
SUT VIC AB 3-0 SH 27 (SUTURE) ×2
SUT VIC AB 3-0 SH 27X BRD (SUTURE) ×1 IMPLANT
SUT VIC AB 5-0 PS2 18 (SUTURE) IMPLANT
SYR CONTROL 10ML LL (SYRINGE) ×2 IMPLANT
TOWEL GREEN STERILE FF (TOWEL DISPOSABLE) ×2 IMPLANT
TRAY FAXITRON CT DISP (TRAY / TRAY PROCEDURE) ×2 IMPLANT
TUBE CONNECTING 20X1/4 (TUBING) IMPLANT
YANKAUER SUCT BULB TIP NO VENT (SUCTIONS) IMPLANT

## 2020-03-12 NOTE — Anesthesia Procedure Notes (Signed)
Procedure Name: LMA Insertion Date/Time: 03/12/2020 7:40 AM Performed by: Marrianne Mood, CRNA Pre-anesthesia Checklist: Patient identified, Emergency Drugs available, Suction available, Patient being monitored and Timeout performed Patient Re-evaluated:Patient Re-evaluated prior to induction Oxygen Delivery Method: Circle system utilized Preoxygenation: Pre-oxygenation with 100% oxygen Induction Type: IV induction Ventilation: Mask ventilation without difficulty LMA: LMA inserted LMA Size: 4.0 Number of attempts: 1 Airway Equipment and Method: Bite block Placement Confirmation: positive ETCO2 Tube secured with: Tape Dental Injury: Teeth and Oropharynx as per pre-operative assessment

## 2020-03-12 NOTE — Transfer of Care (Signed)
Immediate Anesthesia Transfer of Care Note  Patient: Victoria Maddox  Procedure(s) Performed: LEFT BREAST LUMPECTOMY WITH BRACKETED RADIOACTIVE SEED LOCALIZATION (Left Breast)  Patient Location: PACU  Anesthesia Type:General  Level of Consciousness: awake and sedated  Airway & Oxygen Therapy: Patient Spontanous Breathing and Patient connected to face mask oxygen  Post-op Assessment: Report given to RN and Post -op Vital signs reviewed and stable  Post vital signs: Reviewed and stable  Last Vitals:  Vitals Value Taken Time  BP    Temp    Pulse 89 03/12/20 0837  Resp 14 03/12/20 0837  SpO2 100 % 03/12/20 0837  Vitals shown include unvalidated device data.  Last Pain:  Vitals:   03/12/20 0633  PainSc: 0-No pain      Patients Stated Pain Goal: 1 ([phone] [id number])  Complications: No apparent anesthesia complications

## 2020-03-12 NOTE — Op Note (Signed)
Preoperative diagnosis: Left breast low-grade DCIS and flat epithelial atypia Postoperative diagnosis: Same as above Procedure: Left breast radioactive seed bracketed lumpectomy Surgeon: Dr. Serita Grammes Anesthesia: General Estimated blood loss: Minimal Complications: None Drains: None Specimens: Left breast lumpectomy containing clip and a seed, the other seed is separate, additional lateral margin all marked with paint Sponge and count was correct completion Disposition to recovery stable condition  Indications:43 yof without family history of breast cancer, no prior history who had screening mm with left breast calcifications. she has no mass or dc. she had mm that shows c density breasts. in left uoq there are 2.9 cm of calcs. she has undergone biopsy of extent and clips are 2.8 cm apart. the biopsies are lg dcis with calcs er/pr pos and FEA. she has prior right breast biopsy at outside hospital some time ago. genetics are negative, mri doesnt show anything else concerning. she is here to discuss options  Procedure: After informed consent was obtained the patient first had a radioactive seeds placed.  She was taken to the operating room.  She was given antibiotics.  SCDs were in place.  She was placed under general anesthesia without complication.  She was prepped and draped in the standard sterile surgical fashion.  A surgical timeout was then performed.  I identified the location of both seeds and the lateral central left breast.  I then made a periareolar incision after infiltrating with Marcaine in order to hide the scar later.  I then dissected down to the seeds.  Her tissue was extraordinarily dense and was very hard to even cauterize through.  Then I removed the superior seed and the surrounding tissue and then went to the inferior seed as well.  This was very dense at the inferior portion and the seed was in small hematoma.  I removed the seed separately. The seed and clip were in  initial specimen. I confirmed removal of the other seed and took some additional lateral margin where this was located. I saw the other clip.   It was not on mammogram though.  I felt like area was adequately removed from where localized and obtained hemostasis.  I then closed breast tissue with 2-0 vicryl. The skin was closed with 3-0 Vicryl and 5-0 Monocryl.  Glue and Steri-Strips were applied.  She tolerated this well was extubated and transferred to recovery stable.

## 2020-03-12 NOTE — Interval H&P Note (Signed)
History and Physical Interval Note:  03/12/2020 7:20 AM  [place]  has presented today for surgery, with the diagnosis of LEFT BREAST DUCTAL CARCINOMA IN SITU.  The various methods of treatment have been discussed with the patient and family. After consideration of risks, benefits and other options for treatment, the patient has consented to  Procedure(s): LEFT BREAST LUMPECTOMY WITH BRACKETED RADIOACTIVE SEED LOCALIZATION (Left) as a surgical intervention.  The patient's history has been reviewed, patient examined, no change in status, stable for surgery.  I have reviewed the patient's chart and labs.  Questions were answered to the patient's satisfaction.     Rolm Bookbinder

## 2020-03-12 NOTE — Anesthesia Preprocedure Evaluation (Addendum)
Anesthesia Evaluation  Patient identified by MRN, date of birth, ID band Patient awake    Reviewed: Allergy & Precautions, NPO status , Patient's Chart, lab work & pertinent test results  Airway Mallampati: II  TM Distance: >3 FB Neck ROM: Full    Dental no notable dental hx.    Pulmonary neg pulmonary ROS,    Pulmonary exam normal breath sounds clear to auscultation       Cardiovascular hypertension, Pt. on medications negative cardio ROS Normal cardiovascular exam Rhythm:Regular Rate:Normal     Neuro/Psych  Headaches, Depression negative psych ROS   GI/Hepatic negative GI ROS, Neg liver ROS,   Endo/Other  negative endocrine ROS  Renal/GU negative Renal ROS  negative genitourinary   Musculoskeletal negative musculoskeletal ROS (+)   Abdominal   Peds negative pediatric ROS (+)  Hematology negative hematology ROS (+)   Anesthesia Other Findings Breast Cancer  Reproductive/Obstetrics negative OB ROS                             Anesthesia Physical Anesthesia Plan  ASA: III  Anesthesia Plan: General   Post-op Pain Management:    Induction: Intravenous  PONV Risk Score and Plan: 3 and Ondansetron, Dexamethasone and Midazolam  Airway Management Planned: LMA  Additional Equipment:   Intra-op Plan:   Post-operative Plan: Extubation in OR  Informed Consent: I have reviewed the patients History and Physical, chart, labs and discussed the procedure including the risks, benefits and alternatives for the proposed anesthesia with the patient or authorized representative who has indicated his/her understanding and acceptance.     Dental advisory given  Plan Discussed with: CRNA  Anesthesia Plan Comments:         Anesthesia Quick Evaluation

## 2020-03-12 NOTE — H&P (Signed)
Victoria Maddox is an 44 y.o. female.   Chief Complaint: breast cancer HPI: 61 yof without family history of breast cancer, no prior history who had screening mm with left breast calcifications. she has no mass or dc. she had mm that shows c density breasts. in left uoq there are 2.9 cm of calcs. she has undergone biopsy of extent and clips are 2.8 cm apart. the biopsies are lg dcis with calcs er/pr pos and FEA. she has prior right breast biopsy at outside hospital some time ago. genetics are negative, mri doesnt show anything else concerning. she is here to discuss options   Past Medical History:  Diagnosis Date  . Allergy   . Depression   . Family history of breast cancer   . Family history of kidney cancer   . Family history of throat cancer   . Frequent headaches   . Heart murmur   . History of recurrent UTIs   . Hypertension   . Migraines   . Urinary incontinence     Past Surgical History:  Procedure Laterality Date  . DILATION AND CURETTAGE OF UTERUS      Family History  Problem Relation Age of Onset  . Heart attack Mother   . Heart disease Mother   . Hypertension Mother   . Early death Father        unknown cause  . Hypertension Father   . Hypertension Sister   . Diabetes Brother   . Hypertension Brother   . Asthma Sister   . Hypertension Sister   . Hyperlipidemia Brother   . Hypertension Brother   . Mental illness Brother   . Heart disease Sister   . Hypertension Sister   . Arthritis Brother   . Diabetes Brother   . Hypertension Brother   . Breast cancer Cousin 7       paternal first cousin  . Throat cancer Sister 65       non-smoker  . Breast cancer Cousin        dx. in her 52s, paternal first cousin once removed  . Kidney cancer Other 40   Social History:  reports that she has never smoked. She has never used smokeless tobacco. She reports previous alcohol use. She reports that she does not use drugs.  Allergies:  Allergies  Allergen  Reactions  . Trazodone And Nefazodone     Dizziness  . Zoloft [Sertraline Hcl]     Anxiousness, lash out at kids    Medications Prior to Admission  Medication Sig Dispense Refill  . acetaminophen (TYLENOL) 325 MG tablet Take 650 mg by mouth every 6 (six) hours as needed.    . diphenhydrAMINE (BENADRYL) 25 mg capsule Take 25 mg by mouth at bedtime as needed.    . verapamil (CALAN SR) 180 MG CR tablet Take 1 tablet (180 mg total) by mouth at bedtime. 90 tablet 0  . hydrochlorothiazide (HYDRODIURIL) 25 MG tablet TAKE 1 TABLET (25 MG TOTAL) BY MOUTH DAILY. 90 tablet 1  . ondansetron (ZOFRAN) 4 MG tablet Take 1 tablet (4 mg total) by mouth every 8 (eight) hours as needed for nausea or vomiting. (Patient not taking: Reported on 02/13/2020) 20 tablet 0    Results for orders placed or performed during the hospital encounter of 03/12/20 (from the past 48 hour(s))  Pregnancy, urine POC     Status: None   Collection Time: 03/12/20  7:12 AM  Result Value Ref Range   Preg Test, Ur  NEGATIVE NEGATIVE    Comment:        THE SENSITIVITY OF THIS METHODOLOGY IS >24 mIU/mL    MM LT RADIOACTIVE SEED LOC MAMMO GUIDE  Result Date: 03/11/2020 CLINICAL DATA:  Patient presents for mammographically guided radioactive seed localization, prior to surgical excision, of 2 left breast lesions, an area of DCIS localized with a coil clip and an area of flat epithelial atypia with calcifications localized with an X shaped clip. EXAM: MAMMOGRAPHIC GUIDED RADIOACTIVE SEED LOCALIZATION OF THE LEFT BREAST: 2 LESIONS LOCALIZED COMPARISON:  Previous exam(s). FINDINGS: Patient presents for radioactive seed localization prior to surgical excision. I met with the patient and we discussed the procedure of seed localization including benefits and alternatives. We discussed the high likelihood of a successful procedure. We discussed the risks of the procedure including infection, bleeding, tissue injury and further surgery. We discussed  the low dose of radioactivity involved in the procedure. Informed, written consent was given. The usual time-out protocol was performed immediately prior to the procedure. Coil shaped clip: Using mammographic guidance, sterile technique, 1% lidocaine and an I-125 radioactive seed, the coil shaped biopsy clip was localized using a lateral approach. The follow-up mammogram images confirm the seed in the expected location and were marked for Dr. Donne Hazel. Follow-up survey of the patient confirms presence of the radioactive seed. Order number of I-125 seed:  [id number]. Total activity:  7.681 millicuries reference Date: 02/23/2020 X shaped clip: Using mammographic guidance, sterile technique, 1% lidocaine and an I-125 radioactive seed, the X shaped biopsy clip was localized using a lateral approach. The follow-up mammogram images confirm the seed in the expected location and were marked for Dr. Donne Hazel. Follow-up survey of the patient confirms presence of the radioactive seed. Order number of I-125 seed:  [id number]. Total activity:  5.974 millicuries reference Date: 02/23/2020 The patient tolerated the procedure well and was released from the [facility]. She was given instructions regarding seed removal. IMPRESSION: Radioactive seed localization of 2 lesions in the breast. No apparent complications. Electronically Signed   By: Lajean Manes M.D.   On: 03/11/2020 14:06   MM LT RAD SEED EA ADD LESION LOC MAMMO  Result Date: 03/11/2020 CLINICAL DATA:  Patient presents for mammographically guided radioactive seed localization, prior to surgical excision, of 2 left breast lesions, an area of DCIS localized with a coil clip and an area of flat epithelial atypia with calcifications localized with an X shaped clip. EXAM: MAMMOGRAPHIC GUIDED RADIOACTIVE SEED LOCALIZATION OF THE LEFT BREAST: 2 LESIONS LOCALIZED COMPARISON:  Previous exam(s). FINDINGS: Patient presents for radioactive seed localization prior to surgical  excision. I met with the patient and we discussed the procedure of seed localization including benefits and alternatives. We discussed the high likelihood of a successful procedure. We discussed the risks of the procedure including infection, bleeding, tissue injury and further surgery. We discussed the low dose of radioactivity involved in the procedure. Informed, written consent was given. The usual time-out protocol was performed immediately prior to the procedure. Coil shaped clip: Using mammographic guidance, sterile technique, 1% lidocaine and an I-125 radioactive seed, the coil shaped biopsy clip was localized using a lateral approach. The follow-up mammogram images confirm the seed in the expected location and were marked for Dr. Donne Hazel. Follow-up survey of the patient confirms presence of the radioactive seed. Order number of I-125 seed:  [id number]. Total activity:  6.803 millicuries reference Date: 02/23/2020 X shaped clip: Using mammographic guidance, sterile technique, 1% lidocaine and an I-125 radioactive  seed, the X shaped biopsy clip was localized using a lateral approach. The follow-up mammogram images confirm the seed in the expected location and were marked for Dr. Donne Hazel. Follow-up survey of the patient confirms presence of the radioactive seed. Order number of I-125 seed:  [id number]. Total activity:  5.883 millicuries reference Date: 02/23/2020 The patient tolerated the procedure well and was released from the [facility]. She was given instructions regarding seed removal. IMPRESSION: Radioactive seed localization of 2 lesions in the breast. No apparent complications. Electronically Signed   By: Lajean Manes M.D.   On: 03/11/2020 14:06    Review of Systems Negative  Blood pressure 129/68, pulse 81, temperature (!) 97.5 F (36.4 C), resp. rate 16, height 5' 3"  (1.6 m), weight 64.3 kg, last menstrual period 02/13/2020, SpO2 100 %. Physical Exam  General Mental  Status-Alert. Orientation-Oriented X3. Breast Nipples-No Discharge. Breast Lump-No Palpable Breast Mass.  cv rrr Lungs clear  Assessment/Plan Left breast seed bracketed lumpectomy We discussed the options for treatment of the breast cancer which included lumpectomy versus a mastectomy again. These are equivalent. she has family member who is physician [place] and is telling her to get a mastectomy. We discussed the performance of the lumpectomy with radioactive seed placement. We discussed a 5-10% chance of a positive margin requiring reexcision in the operating room. We also discussed that she willneed radiation therapy if she undergoes lumpectomy. The breast cannot undergo more radiation therapy in the same breast after lumpectomy in the future. We discussed mastectomy and the postoperative care for that as well. Mastectomy can be followed by reconstruction. The decision for lumpectomy vs mastectomy has no impact on decision for chemotherapy. Most mastectomy patients will not need radiation therapy. We discussed that there is no difference in her survival whether she undergoes lumpectomy with radiation therapy or antiestrogen therapy versus a mastectomy. There is also no real difference between her recurrence in the breast. She would like to proceed with lumpectomy and will schedule her. Rolm Bookbinder, MD 03/12/2020, 7:18 AM

## 2020-03-12 NOTE — Discharge Instructions (Signed)
[facility] Office Phone Number [phone]  BREAST BIOPSY/ PARTIAL MASTECTOMY: POST OP INSTRUCTIONS Take 400 mg of ibuprofen every 8 hours or 650 mg tylenol every 6 hours for next 72 hours then as needed. Use ice several times daily also. Always review your discharge instruction sheet given to you by the facility where your surgery was performed.  IF YOU HAVE DISABILITY OR FAMILY LEAVE FORMS, YOU MUST BRING THEM TO THE OFFICE FOR PROCESSING.  DO NOT GIVE THEM TO YOUR DOCTOR.  1. A prescription for pain medication may be given to you upon discharge.  Take your pain medication as prescribed, if needed.  If narcotic pain medicine is not needed, then you may take acetaminophen (Tylenol), naprosyn (Alleve) or ibuprofen (Advil) as needed. 2. Take your usually prescribed medications unless otherwise directed 3. If you need a refill on your pain medication, please contact your pharmacy.  They will contact our office to request authorization.  Prescriptions will not be filled after 5pm or on week-ends. 4. You should eat very light the first 24 hours after surgery, such as soup, crackers, pudding, etc.  Resume your normal diet the day after surgery. 5. Most patients will experience some swelling and bruising in the breast.  Ice packs and a good support bra will help.  Wear the breast binder provided or a sports bra for 72 hours day and night.  After that wear a sports bra during the day until you return to the office. Swelling and bruising can take several days to resolve.  6. It is common to experience some constipation if taking pain medication after surgery.  Increasing fluid intake and taking a stool softener will usually help or prevent this problem from occurring.  A mild laxative (Milk of Magnesia or Miralax) should be taken according to package directions if there are no bowel movements after 48 hours. 7. Unless discharge instructions indicate otherwise, you may remove your bandages 48  hours after surgery and you may shower at that time.  You may have steri-strips (small skin tapes) in place directly over the incision.  These strips should be left on the skin for 7-10 days and will come off on their own.  If your surgeon used skin glue on the incision, you may shower in 24 hours.  The glue will flake off over the next 2-3 weeks.  Any sutures or staples will be removed at the office during your follow-up visit. 8. ACTIVITIES:  You may resume regular daily activities (gradually increasing) beginning the next day.  Wearing a good support bra or sports bra minimizes pain and swelling.  You may have sexual intercourse when it is comfortable. a. You may drive when you no longer are taking prescription pain medication, you can comfortably wear a seatbelt, and you can safely maneuver your car and apply brakes. b. RETURN TO WORK:  ______________________________________________________________________________________ 9. You should see your doctor in the office for a follow-up appointment approximately two weeks after your surgery.  Your doctor's nurse will typically make your follow-up appointment when she calls you with your pathology report.  Expect your pathology report 3-4 business days after your surgery.  You may call to check if you do not hear from us after three days. 10. OTHER INSTRUCTIONS: _______________________________________________________________________________________________ _____________________________________________________________________________________________________________________________________ _____________________________________________________________________________________________________________________________________ _____________________________________________________________________________________________________________________________________  WHEN TO CALL DR WAKEFIELD: 1. Fever over 101.0 2. Nausea and/or vomiting. 3. Extreme swelling or  bruising. 4. Continued bleeding from incision. 5. Increased pain, redness, or drainage from the incision.  The clinic   staff is available to answer your questions during regular business hours.  Please don't hesitate to call and ask to speak to one of the nurses for clinical concerns.  If you have a medical emergency, go to the nearest emergency room or call 911.  A surgeon from [facility] is always on call at the hospital.  For further questions, please visit [email or web address]     NO TYLENOL PRODUCTS UNTIL 12:30 PM   NO ADVIL/ MOTRIN UNTIL 3:00 PM     Post Anesthesia Home Care Instructions  Activity: Get plenty of rest for the remainder of the day. A responsible individual must stay with you for 24 hours following the procedure.  For the next 24 hours, DO NOT: -Drive a car -Paediatric nurse -Drink alcoholic beverages -Take any medication unless instructed by your physician -Make any legal decisions or sign important papers.  Meals: Start with liquid foods such as gelatin or soup. Progress to regular foods as tolerated. Avoid greasy, spicy, heavy foods. If nausea and/or vomiting occur, drink only clear liquids until the nausea and/or vomiting subsides. Call your physician if vomiting continues.  Special Instructions/Symptoms: Your throat may feel dry or sore from the anesthesia or the breathing tube placed in your throat during surgery. If this causes discomfort, gargle with warm salt water. The discomfort should disappear within 24 hours.  If you had a scopolamine patch placed behind your ear for the management of post- operative nausea and/or vomiting:  1. The medication in the patch is effective for 72 hours, after which it should be removed.  Wrap patch in a tissue and discard in the trash. Wash hands thoroughly with soap and water. 2. You may remove the patch earlier than 72 hours if you experience unpleasant side effects which may include dry  mouth, dizziness or visual disturbances. 3. Avoid touching the patch. Wash your hands with soap and water after contact with the patch.

## 2020-03-12 NOTE — Anesthesia Postprocedure Evaluation (Signed)
Anesthesia Post Note  Patient: Lorenda Ishihara  Procedure(s) Performed: LEFT BREAST LUMPECTOMY WITH BRACKETED RADIOACTIVE SEED LOCALIZATION (Left Breast)     Patient location during evaluation: PACU Anesthesia Type: General Level of consciousness: awake and alert Pain management: pain level controlled Vital Signs Assessment: post-procedure vital signs reviewed and stable Respiratory status: spontaneous breathing, nonlabored ventilation and respiratory function stable Cardiovascular status: blood pressure returned to baseline and stable Postop Assessment: no apparent nausea or vomiting Anesthetic complications: no    Last Vitals:  Vitals:   03/12/20 1015 03/12/20 1030  BP: (!) 156/83 (!) 157/86  Pulse: 73 72  Resp:  16  Temp:    SpO2: 100% 100%    Last Pain:  Vitals:   03/12/20 0945  PainSc: Asleep                 Lynda Rainwater

## 2020-03-13 ENCOUNTER — Encounter: Payer: Self-pay | Admitting: *Deleted

## 2020-03-13 NOTE — Addendum Note (Signed)
Addendum  created 03/13/20 1233 by Tawni Millers, CRNA   Charge Capture section accepted

## 2020-03-14 LAB — SURGICAL PATHOLOGY

## 2020-03-15 MED FILL — ONDANSETRON ODT 4 MG TABLET: 4 | 3 days supply | Qty: 15 | Fill #0

## 2020-03-18 ENCOUNTER — Encounter: Payer: Self-pay | Admitting: *Deleted

## 2020-03-18 NOTE — Progress Notes (Signed)
Received emails from patient requesting letter for [employer] [facility] [place] for a family to come help her out while she is having treatment for her DCIS.  I have sent emails back to her with no response about information needed for the  letter.

## 2020-03-19 ENCOUNTER — Ambulatory Visit: Payer: No Typology Code available for payment source | Admitting: Family Medicine

## 2020-03-19 NOTE — Progress Notes (Signed)
Patient Care Team: Libby Maw, MD as PCP - General (Family Medicine) Mauro Kaufmann, RN as Oncology Nurse Navigator Rockwell Germany, RN as Registered Nurse Rolm Bookbinder, MD as Consulting Physician (General Surgery) Nicholas Lose, MD as Consulting Physician (Hematology and Oncology) Kyung Rudd, MD as Consulting Physician (Radiation Oncology)  DIAGNOSIS:    ICD-10-CM   1. Ductal carcinoma in situ (DCIS) of left breast  D05.12     SUMMARY OF ONCOLOGIC HISTORY: Oncology History  Ductal carcinoma in situ (DCIS) of left breast  02/08/2020 Initial Diagnosis   Screening mammogram detected left breast calcifications, 2.9cm. Biopsy showed DCIS with calcifications, low grade, ER+ 70%, PR+ 90%.   02/20/2020 Genetic Testing   Negative genetic testing:  No pathogenic variants detected on the [facility] Breast Cancer STAT or Multi-Cancer Panels. The report date is 02/20/2020.  The Breast Cancer STAT Panel offered by [facility] includes sequencing and deletion/duplication analysis for the following 9 genes:  ATM, BRCA1, BRCA2, CDH1, CHEK2, PALB2, PTEN, STK11 and TP53.  The Multi-Cancer Panel offered by [facility] includes sequencing and/or deletion duplication testing of the following 85 genes: AIP, ALK, APC, ATM, AXIN2,BAP1,  BARD1, BLM, BMPR1A, BRCA1, BRCA2, BRIP1, CASR, CDC73, CDH1, CDK4, CDKN1B, CDKN1C, CDKN2A (p14ARF), CDKN2A (p16INK4a), CEBPA, CHEK2, CTNNA1, DICER1, DIS3L2, EGFR (c.2369C>T, p.Thr790Met variant only), EPCAM (Deletion/duplication testing only), FH, FLCN, GATA2, GPC3, GREM1 (Promoter region deletion/duplication testing only), HOXB13 (c.251G>A, p.Gly84Glu), HRAS, KIT, MAX, MEN1, MET, MITF (c.952G>A, p.Glu318Lys variant only), MLH1, MSH2, MSH3, MSH6, MUTYH, NBN, NF1, NF2, NTHL1, PALB2, PDGFRA, PHOX2B, PMS2, POLD1, POLE, POT1, PRKAR1A, PTCH1, PTEN, RAD50, RAD51C, RAD51D, RB1, RECQL4, RET, RNF43, RUNX1, SDHAF2, SDHA (sequence changes only), SDHB, SDHC, SDHD, SMAD4, SMARCA4, SMARCB1,  SMARCE1, STK11, SUFU, TERC, TERT, TMEM127, TP53, TSC1, TSC2, VHL, WRN and WT1.   03/12/2020 Surgery   Left lumpectomy [name]): flat epithelial atypia with calcifications.      CHIEF COMPLIANT: Follow-up s/p lumpectomy to review pathology   INTERVAL HISTORY: Victoria Maddox is a 44 y.o. with above-mentioned history of left breast DCIS. She underwent a left lumpectomy on 03/12/20 with Dr. [name] for which pathology showed flat epithelial atypia with calcifications. She presents to the clinic today to discuss the pathology report and further treatment.   ALLERGIES:  is allergic to trazodone and nefazodone and zoloft [sertraline hcl].  MEDICATIONS:  Current Outpatient Medications  Medication Sig Dispense Refill  . acetaminophen (TYLENOL) 325 MG tablet Take 650 mg by mouth every 6 (six) hours as needed.    . diphenhydrAMINE (BENADRYL) 25 mg capsule Take 25 mg by mouth at bedtime as needed.    . hydrochlorothiazide (HYDRODIURIL) 25 MG tablet TAKE 1 TABLET (25 MG TOTAL) BY MOUTH DAILY. 90 tablet 1  . ondansetron (ZOFRAN) 4 MG tablet Take 1 tablet (4 mg total) by mouth every 8 (eight) hours as needed for nausea or vomiting. (Patient not taking: Reported on 02/13/2020) 20 tablet 0  . traMADol (ULTRAM) 50 MG tablet Take 1 tablet (50 mg total) by mouth every 6 (six) hours as needed. 10 tablet 0  . verapamil (CALAN SR) 180 MG CR tablet Take 1 tablet (180 mg total) by mouth at bedtime. 90 tablet 0   No current facility-administered medications for this visit.    PHYSICAL EXAMINATION: ECOG PERFORMANCE STATUS: 1 - Symptomatic but completely ambulatory  Vitals:   03/20/20 1523  BP: (!) 146/61  Pulse: (!) 59  Resp: 17  Temp: 98.3 F (36.8 C)  SpO2: 100%   Filed Weights   03/20/20  1523  Weight: 138 lb 4.8 oz (62.7 kg)    LABORATORY DATA:  I have reviewed the data as listed CMP Latest Ref Rng & Units 02/14/2020 05/04/2018  Glucose 70 - 99 mg/dL 107(H) 110(H)  BUN 6 - 20 mg/dL 12 13  Creatinine 0.44  - 1.00 mg/dL 0.77 0.65  Sodium 135 - 145 mmol/L 140 136  Potassium 3.5 - 5.1 mmol/L 3.8 3.7  Chloride 98 - 111 mmol/L 106 102  CO2 22 - 32 mmol/L 27 28  Calcium 8.9 - 10.3 mg/dL 8.9 9.5  Total Protein 6.5 - 8.1 g/dL 7.3 7.3  Total Bilirubin 0.3 - 1.2 mg/dL <0.2(L) 0.3  Alkaline Phos 38 - 126 U/L 81 54  AST 15 - 41 U/L 12(L) 14  ALT 0 - 44 U/L 11 15    Lab Results  Component Value Date   WBC 5.1 02/14/2020   HGB 11.7 (L) 02/14/2020   HCT 36.1 02/14/2020   MCV 82.8 02/14/2020   PLT 308 02/14/2020   NEUTROABS 2.1 02/14/2020    ASSESSMENT & PLAN:  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 Biopsy showed DCIS with calcifications, low grade, ER+ 70%, PR+ 90%.  Additional biopsy: Flat epithelial atypia which needs excision Tis NX stage 0  03/12/2020: Left lumpectomy: Flat epithelial atypia radial scar uninvolved by DCIS.  Recommendation: 1. adjuvant radiation therapy 2. Followed by antiestrogen therapy with tamoxifen 5 years  Return to clinic after radiation therapy.    No orders of the defined types were placed in this encounter.  The patient has a good understanding of the overall plan. she agrees with it. she will call with any problems that may develop before the next visit here.  Total time spent: 30 mins including face to face time and time spent for planning, charting and coordination of care  Nicholas Lose, MD 03/20/2020  I, Cloyde Reams Dorshimer, am acting as scribe for Dr. Nicholas Lose.  I have reviewed the above documentation for accuracy and completeness, and I agree with the above.

## 2020-03-20 ENCOUNTER — Other Ambulatory Visit: Payer: Self-pay

## 2020-03-20 ENCOUNTER — Inpatient Hospital Stay
Payer: No Typology Code available for payment source | Attending: Hematology and Oncology | Admitting: Hematology and Oncology

## 2020-03-20 DIAGNOSIS — D0512 Intraductal carcinoma in situ of left breast: Secondary | ICD-10-CM | POA: Diagnosis present

## 2020-03-20 DIAGNOSIS — Z79899 Other long term (current) drug therapy: Secondary | ICD-10-CM | POA: Insufficient documentation

## 2020-03-20 DIAGNOSIS — Z170 Estrogen receptor positive status [ER+]: Secondary | ICD-10-CM | POA: Insufficient documentation

## 2020-03-20 NOTE — Assessment & Plan Note (Signed)
02/08/2020: Screening mammogram detected left breast calcifications, 2.9cm. Biopsy showed DCIS with calcifications, low grade, ER+ 70%, PR+ 90%.  Additional biopsy: Flat epithelial atypia which needs excision Tis NX stage 0  03/12/2020: Left lumpectomy: Flat epithelial atypia radial scar uninvolved by DCIS.  Recommendation: 1. adjuvant radiation therapy 2. Followed by antiestrogen therapy with tamoxifen 5 years  Return to clinic after radiation therapy.

## 2020-03-21 ENCOUNTER — Encounter: Payer: Self-pay | Admitting: *Deleted

## 2020-03-28 ENCOUNTER — Ambulatory Visit: Payer: No Typology Code available for payment source | Admitting: Family Medicine

## 2020-04-01 ENCOUNTER — Other Ambulatory Visit: Payer: Self-pay | Admitting: Radiation Oncology

## 2020-04-01 ENCOUNTER — Encounter: Payer: Self-pay | Admitting: *Deleted

## 2020-04-01 DIAGNOSIS — D0512 Intraductal carcinoma in situ of left breast: Secondary | ICD-10-CM

## 2020-04-02 ENCOUNTER — Telehealth: Payer: Self-pay

## 2020-04-02 NOTE — Telephone Encounter (Signed)
Spoke with patient in regards to appointment on 04/04/20. Advised patient that Theressa Millard. PA would like for her to come in earlier to have a pregnancy test done prior to appointment. Advised patient to check in with the front desk and once she is finished with the lab to come downstairs to radiation and check in. Pt verbalized understanding.

## 2020-04-04 ENCOUNTER — Encounter: Payer: Self-pay | Admitting: Radiation Oncology

## 2020-04-04 ENCOUNTER — Ambulatory Visit
Admission: RE | Admit: 2020-04-04 | Discharge: 2020-04-04 | Disposition: A | Discharge status: Home / Self Care | Payer: No Typology Code available for payment source | Source: Ambulatory Visit | Attending: Radiation Oncology | Admitting: Radiation Oncology

## 2020-04-04 ENCOUNTER — Other Ambulatory Visit: Payer: Self-pay

## 2020-04-04 DIAGNOSIS — Z510 Encounter for antineoplastic radiation therapy: Secondary | ICD-10-CM | POA: Insufficient documentation

## 2020-04-04 DIAGNOSIS — D0512 Intraductal carcinoma in situ of left breast: Secondary | ICD-10-CM | POA: Diagnosis present

## 2020-04-04 DIAGNOSIS — Z170 Estrogen receptor positive status [ER+]: Secondary | ICD-10-CM | POA: Insufficient documentation

## 2020-04-04 LAB — PREGNANCY, URINE: Preg Test, Ur: NEGATIVE

## 2020-04-05 NOTE — Progress Notes (Signed)
Radiation Oncology         [phone] ________________________________  Name: Victoria Maddox        MRN: [id number]  Date of Service: 04/04/2020 DOB: [birth year]  JZ:[id number], Mortimer Fries, MD  Nicholas Lose, MD     REFERRING PHYSICIAN: Nicholas Lose, MD   DIAGNOSIS: The encounter diagnosis was Ductal carcinoma in situ (DCIS) of left breast.   HISTORY OF PRESENT ILLNESS: Victoria Maddox is a 44 y.o. female originally seen in the multidisciplinary breast clinic for a new diagnosis of left breast cancer. The patient was noted to have screening detected calcifications in the left breast which led to further diagnostic imaging. Diagnostic left mammogram on 01/23/2020 revealed a grouping of calcifications in the left breast in the upper outer quadrant measuring approximately 2.9 cm in greatest extent and a stereotactic biopsy was performed on 01/29/2020 revealing an ER/PR positive low-grade DCIS with calcifications in the setting of a complex sclerosing lesion.  A second biopsy of calcifications also seen on 02/06/2020 more lateral to the initial site were consistent with flat epithelial atypia with calcifications.  She proceeded with lumpectomy on 03/12/20 that revealed flat epithelial atypia with calcifications, and radial scars with calcifications. Further later margin excision revealed flat epithelial atypia with calcifications. She is seen today to discuss adjuvant radiotherapy.    PREVIOUS RADIATION THERAPY: No   PAST MEDICAL HISTORY:  Past Medical History:  Diagnosis Date  . Allergy   . Depression   . Family history of breast cancer   . Family history of kidney cancer   . Family history of throat cancer   . Frequent headaches   . Heart murmur   . History of recurrent UTIs   . Hypertension   . Migraines   . Urinary incontinence        PAST SURGICAL HISTORY: Past Surgical History:  Procedure Laterality Date  . BREAST LUMPECTOMY WITH RADIOACTIVE SEED LOCALIZATION Left 03/12/2020     Procedure: LEFT BREAST LUMPECTOMY WITH BRACKETED RADIOACTIVE SEED LOCALIZATION;  Surgeon: Rolm Bookbinder, MD;  Location: [facility];  Service: General;  Laterality: Left;  . DILATION AND CURETTAGE OF UTERUS       FAMILY HISTORY:  Family History  Problem Relation Age of Onset  . Heart attack Mother   . Heart disease Mother   . Hypertension Mother   . Early death Father        unknown cause  . Hypertension Father   . Hypertension Sister   . Diabetes Brother   . Hypertension Brother   . Asthma Sister   . Hypertension Sister   . Hyperlipidemia Brother   . Hypertension Brother   . Mental illness Brother   . Heart disease Sister   . Hypertension Sister   . Arthritis Brother   . Diabetes Brother   . Hypertension Brother   . Breast cancer Cousin 83       paternal first cousin  . Throat cancer Sister 62       non-smoker  . Breast cancer Cousin        dx. in her 55s, paternal first cousin once removed  . Kidney cancer Other 53     SOCIAL HISTORY:  reports that she has never smoked. She has never used smokeless tobacco. She reports previous alcohol use. She reports that she does not use drugs.  The patient is married and lives in [place].  She is a Estate manager/land agent at [employer]. She is originally from [place].  Her husband works in [place] and comes back for short visits every few months. She has a brother in law who is helpful and lives in town. She has a 16 year old daughter and 5 year old twin boys.  ALLERGIES: Trazodone and nefazodone and Zoloft [sertraline hcl]   MEDICATIONS:  Current Outpatient Medications  Medication Sig Dispense Refill  . acetaminophen (TYLENOL) 325 MG tablet Take 650 mg by mouth every 6 (six) hours as needed.    . diphenhydrAMINE (BENADRYL) 25 mg capsule Take 25 mg by mouth at bedtime as needed.    . traMADol (ULTRAM) 50 MG tablet Take 1 tablet (50 mg total) by mouth every 6 (six) hours as needed. 10 tablet 0  . verapamil  (CALAN SR) 180 MG CR tablet Take 1 tablet (180 mg total) by mouth at bedtime. 90 tablet 0  . hydrochlorothiazide (HYDRODIURIL) 25 MG tablet TAKE 1 TABLET (25 MG TOTAL) BY MOUTH DAILY. (Patient not taking: Reported on 04/04/2020) 90 tablet 1  . ondansetron (ZOFRAN) 4 MG tablet Take 1 tablet (4 mg total) by mouth every 8 (eight) hours as needed for nausea or vomiting. (Patient not taking: Reported on 02/13/2020) 20 tablet 0   No current facility-administered medications for this encounter.     REVIEW OF SYSTEMS: On review of systems, the patient reports that she is doing well overall. She feels that she is healing well since her surgery. She denies concerns with the skin of her breast. No other complaints are verbalized.    PHYSICAL EXAM:  Wt Readings from Last 3 Encounters:  03/20/20 138 lb 4.8 oz (62.7 kg)  03/12/20 141 lb 12.1 oz (64.3 kg)  02/14/20 141 lb 3.2 oz (64 kg)   Temp Readings from Last 3 Encounters:  03/20/20 98.3 F (36.8 C) (Temporal)  03/12/20 99.3 F (37.4 C)  02/14/20 98.3 F (36.8 C) (Temporal)   BP Readings from Last 3 Encounters:  03/20/20 (!) 146/61  03/12/20 (!) 155/90  02/14/20 (!) 147/72   Pulse Readings from Last 3 Encounters:  03/20/20 (!) 59  03/12/20 74  02/14/20 70    In general this is a well appearing African female in no acute distress. She's alert and oriented x4 and appropriate throughout the examination. Cardiopulmonary assessment is negative for acute distress and she exhibits normal effort. Bilateral breast exam is deferred.    ECOG = 0  0 - Asymptomatic (Fully active, able to carry on all predisease activities without restriction)  1 - Symptomatic but completely ambulatory (Restricted in physically strenuous activity but ambulatory and able to carry out work of a light or sedentary nature. For example, light housework, office work)  2 - Symptomatic, <50% in bed during the day (Ambulatory and capable of all self care but unable to carry  out any work activities. Up and about more than 50% of waking hours)  3 - Symptomatic, >50% in bed, but not bedbound (Capable of only limited self-care, confined to bed or chair 50% or more of waking hours)  4 - Bedbound (Completely disabled. Cannot carry on any self-care. Totally confined to bed or chair)  5 - Death   Eustace Pen MM, Creech RH, Tormey DC, et al. [phone]). "Toxicity and response criteria of the [facility]". [place] Oncol. 5 (6): 649-55    LABORATORY DATA:  Lab Results  Component Value Date   WBC 5.1 02/14/2020   HGB 11.7 (L) 02/14/2020   HCT 36.1 02/14/2020   MCV 82.8 02/14/2020  PLT 308 02/14/2020   Lab Results  Component Value Date   NA 140 02/14/2020   K 3.8 02/14/2020   CL 106 02/14/2020   CO2 27 02/14/2020   Lab Results  Component Value Date   ALT 11 02/14/2020   AST 12 (L) 02/14/2020   ALKPHOS 81 02/14/2020   BILITOT <0.2 (L) 02/14/2020      RADIOGRAPHY: MM Breast Surgical Specimen  Result Date: 03/12/2020 CLINICAL DATA:  Specimen imaging following surgical excision 2 left breast lesions following radioactive seed localization. EXAM: SPECIMEN RADIOGRAPHS OF THE LEFT BREAST COMPARISON:  Previous exam(s). FINDINGS: Status post excision of the left breast. The radioactive seed localizing the coil shaped clip is within the specimen. The second seed was submitted separately. The ribbon shaped clip was not within the specimen or submitted separately, but was felt to have displaced from the specimen during extraction. IMPRESSION: Specimen radiographs of the left breast. Electronically Signed   By: Lajean Manes M.D.   On: 03/12/2020 08:50   MM Breast Surgical Specimen  Result Date: 03/12/2020 CLINICAL DATA:  Specimen imaging following surgical excision 2 left breast lesions following radioactive seed localization. EXAM: SPECIMEN RADIOGRAPHS OF THE LEFT BREAST COMPARISON:  Previous exam(s). FINDINGS: Status post excision of the left breast.  The radioactive seed localizing the coil shaped clip is within the specimen. The second seed was submitted separately. The ribbon shaped clip was not within the specimen or submitted separately, but was felt to have displaced from the specimen during extraction. IMPRESSION: Specimen radiographs of the left breast. Electronically Signed   By: Lajean Manes M.D.   On: 03/12/2020 08:50   MM LT RADIOACTIVE SEED LOC MAMMO GUIDE  Result Date: 03/11/2020 CLINICAL DATA:  Patient presents for mammographically guided radioactive seed localization, prior to surgical excision, of 2 left breast lesions, an area of DCIS localized with a coil clip and an area of flat epithelial atypia with calcifications localized with an X shaped clip. EXAM: MAMMOGRAPHIC GUIDED RADIOACTIVE SEED LOCALIZATION OF THE LEFT BREAST: 2 LESIONS LOCALIZED COMPARISON:  Previous exam(s). FINDINGS: Patient presents for radioactive seed localization prior to surgical excision. I met with the patient and we discussed the procedure of seed localization including benefits and alternatives. We discussed the high likelihood of a successful procedure. We discussed the risks of the procedure including infection, bleeding, tissue injury and further surgery. We discussed the low dose of radioactivity involved in the procedure. Informed, written consent was given. The usual time-out protocol was performed immediately prior to the procedure. Coil shaped clip: Using mammographic guidance, sterile technique, 1% lidocaine and an I-125 radioactive seed, the coil shaped biopsy clip was localized using a lateral approach. The follow-up mammogram images confirm the seed in the expected location and were marked for Dr. Donne Hazel. Follow-up survey of the patient confirms presence of the radioactive seed. Order number of I-125 seed:  [id number]. Total activity:  5.009 millicuries reference Date: 02/23/2020 X shaped clip: Using mammographic guidance, sterile technique, 1%  lidocaine and an I-125 radioactive seed, the X shaped biopsy clip was localized using a lateral approach. The follow-up mammogram images confirm the seed in the expected location and were marked for Dr. Donne Hazel. Follow-up survey of the patient confirms presence of the radioactive seed. Order number of I-125 seed:  [id number]. Total activity:  1.696 millicuries reference Date: 02/23/2020 The patient tolerated the procedure well and was released from the [facility]. She was given instructions regarding seed removal. IMPRESSION: Radioactive seed localization of 2 lesions in  the breast. No apparent complications. Electronically Signed   By: Lajean Manes M.D.   On: 03/11/2020 14:06   MM LT RAD SEED EA ADD LESION LOC MAMMO  Result Date: 03/11/2020 CLINICAL DATA:  Patient presents for mammographically guided radioactive seed localization, prior to surgical excision, of 2 left breast lesions, an area of DCIS localized with a coil clip and an area of flat epithelial atypia with calcifications localized with an X shaped clip. EXAM: MAMMOGRAPHIC GUIDED RADIOACTIVE SEED LOCALIZATION OF THE LEFT BREAST: 2 LESIONS LOCALIZED COMPARISON:  Previous exam(s). FINDINGS: Patient presents for radioactive seed localization prior to surgical excision. I met with the patient and we discussed the procedure of seed localization including benefits and alternatives. We discussed the high likelihood of a successful procedure. We discussed the risks of the procedure including infection, bleeding, tissue injury and further surgery. We discussed the low dose of radioactivity involved in the procedure. Informed, written consent was given. The usual time-out protocol was performed immediately prior to the procedure. Coil shaped clip: Using mammographic guidance, sterile technique, 1% lidocaine and an I-125 radioactive seed, the coil shaped biopsy clip was localized using a lateral approach. The follow-up mammogram images confirm the seed in  the expected location and were marked for Dr. Donne Hazel. Follow-up survey of the patient confirms presence of the radioactive seed. Order number of I-125 seed:  [id number]. Total activity:  1.624 millicuries reference Date: 02/23/2020 X shaped clip: Using mammographic guidance, sterile technique, 1% lidocaine and an I-125 radioactive seed, the X shaped biopsy clip was localized using a lateral approach. The follow-up mammogram images confirm the seed in the expected location and were marked for Dr. Donne Hazel. Follow-up survey of the patient confirms presence of the radioactive seed. Order number of I-125 seed:  [id number]. Total activity:  7.505 millicuries reference Date: 02/23/2020 The patient tolerated the procedure well and was released from the [facility]. She was given instructions regarding seed removal. IMPRESSION: Radioactive seed localization of 2 lesions in the breast. No apparent complications. Electronically Signed   By: Lajean Manes M.D.   On: 03/11/2020 14:06    IMPRESSION/PLAN: 1. Low-grade ER/PR positive DCIS of the left breast. Dr. Lisbeth Renshaw discusses the final pathology findings and reviews the nature of noninvasive breast disease. The patient is healing well since her surgery. We reviewed the rationale to make treatment recommendations on the initial biopsy findings, and detailed that external radiotherapy would be recommended to reduce the risks of local recurrence. We discussed the risks, benefits, short, and long term effects of radiotherapy, and the patient is interested in proceeding. Dr. Lisbeth Renshaw discusses the delivery and logistics of radiotherapy and anticipates a course of 6 1/2 weeks of radiotherapy with deep inspiration breath hold technique. She will simulate following today's visit. Written consent was obtained and placed in her chart, a copy was also given to the patient. 2.         Contraceptive Counseling. The patient is not sexually active and declines pregnancy testing. She is  aware she will need to avoid sexual activity during treatment or use condoms for contraception. She assures me she also has low risk of becoming pregnant as she struggled with infertility.     In a visit lasting 45 minutes, greater than 50% of the time was spent face to face reviewing her case, as well as in preparation of, discussing, and coordinating the patient's care.  The above documentation reflects my direct findings during this shared patient visit. Please see the separate note  by Dr. Lisbeth Renshaw on this date for the remainder of the patient's plan of care.    Carola Rhine, PAC

## 2020-04-09 ENCOUNTER — Ambulatory Visit (INDEPENDENT_AMBULATORY_CARE_PROVIDER_SITE_OTHER): Payer: No Typology Code available for payment source | Admitting: Family Medicine

## 2020-04-09 ENCOUNTER — Encounter: Payer: Self-pay | Admitting: Family Medicine

## 2020-04-09 ENCOUNTER — Other Ambulatory Visit: Payer: Self-pay

## 2020-04-09 ENCOUNTER — Other Ambulatory Visit: Payer: Self-pay | Admitting: Family Medicine

## 2020-04-09 ENCOUNTER — Encounter: Payer: Self-pay | Admitting: *Deleted

## 2020-04-09 VITALS — BP 100/80 | HR 73 | Temp 98.0°F | Ht 63.0 in | Wt 140.4 lb

## 2020-04-09 DIAGNOSIS — I10 Essential (primary) hypertension: Secondary | ICD-10-CM | POA: Diagnosis not present

## 2020-04-09 MED ORDER — VERAPAMIL HCL ER 180 MG PO TBCR: 180.0000 mg | EXTENDED_RELEASE_TABLET | Freq: Every day | ORAL | 1 refills | Status: DC

## 2020-04-09 NOTE — Progress Notes (Signed)
Established Patient Office Visit  Subjective:  Patient ID: Victoria Maddox, female    DOB: 1976/05/29  Age: 44 y.o. MRN: [id number]  CC:  Chief Complaint  Patient presents with  . Hypertension    5 week follow up visit    HPI Victoria Maddox presents for follow-up of her hypertension.  Blood pressure has been running higher at home and other doctors visits. Typically controlled in the 120-140/ 80-90s. Mostly in the 130s/80s.  She is tolerating the medicine well and it is affordable for her.  Status post lumpectomy for DCIS.  Local radiation is planned in the near future.  She is doing well.  Past Medical History:  Diagnosis Date  . Allergy   . Depression   . Family history of breast cancer   . Family history of kidney cancer   . Family history of throat cancer   . Frequent headaches   . Heart murmur   . History of recurrent UTIs   . Hypertension   . Migraines   . Urinary incontinence     Past Surgical History:  Procedure Laterality Date  . BREAST LUMPECTOMY WITH RADIOACTIVE SEED LOCALIZATION Left 03/12/2020   Procedure: LEFT BREAST LUMPECTOMY WITH BRACKETED RADIOACTIVE SEED LOCALIZATION;  Surgeon: Rolm Bookbinder, MD;  Location: [facility];  Service: General;  Laterality: Left;  . DILATION AND CURETTAGE OF UTERUS      Family History  Problem Relation Age of Onset  . Heart attack Mother   . Heart disease Mother   . Hypertension Mother   . Early death Father        unknown cause  . Hypertension Father   . Hypertension Sister   . Diabetes Brother   . Hypertension Brother   . Asthma Sister   . Hypertension Sister   . Hyperlipidemia Brother   . Hypertension Brother   . Mental illness Brother   . Heart disease Sister   . Hypertension Sister   . Arthritis Brother   . Diabetes Brother   . Hypertension Brother   . Breast cancer Cousin 42       paternal first cousin  . Throat cancer Sister 83       non-smoker  . Breast cancer Cousin        dx. in  her 25s, paternal first cousin once removed  . Kidney cancer Other 62    Social History   Socioeconomic History  . Marital status: Married    Spouse name: Not on file  . Number of children: Not on file  . Years of education: Not on file  . Highest education level: Not on file  Occupational History  . Not on file  Tobacco Use  . Smoking status: Never Smoker  . Smokeless tobacco: Never Used  Substance and Sexual Activity  . Alcohol use: Not Currently  . Drug use: Never  . Sexual activity: Yes    Partners: Male  Other Topics Concern  . Not on file  Social History Narrative  . Not on file   Social Determinants of Health   Financial Resource Strain:   . Difficulty of Paying Living Expenses:   Food Insecurity:   . Worried About Charity fundraiser in the Last Year:   . Arboriculturist in the Last Year:   Transportation Needs:   . Film/video editor (Medical):   Marland Kitchen Lack of Transportation (Non-Medical):   Physical Activity:   . Days of Exercise per Week:   .  Minutes of Exercise per Session:   Stress:   . Feeling of Stress :   Social Connections:   . Frequency of Communication with Friends and Family:   . Frequency of Social Gatherings with Friends and Family:   . Attends Religious Services:   . Active Member of Clubs or Organizations:   . Attends Archivist Meetings:   Marland Kitchen Marital Status:   Intimate Partner Violence:   . Fear of Current or Ex-Partner:   . Emotionally Abused:   Marland Kitchen Physically Abused:   . Sexually Abused:     Outpatient Medications Prior to Visit  Medication Sig Dispense Refill  . acetaminophen (TYLENOL) 325 MG tablet Take 650 mg by mouth every 6 (six) hours as needed.    . diphenhydrAMINE (BENADRYL) 25 mg capsule Take 25 mg by mouth at bedtime as needed.    . verapamil (CALAN SR) 180 MG CR tablet Take 1 tablet (180 mg total) by mouth at bedtime. 90 tablet 0  . hydrochlorothiazide (HYDRODIURIL) 25 MG tablet TAKE 1 TABLET (25 MG TOTAL) BY  MOUTH DAILY. (Patient not taking: Reported on 04/04/2020) 90 tablet 1  . ondansetron (ZOFRAN) 4 MG tablet Take 1 tablet (4 mg total) by mouth every 8 (eight) hours as needed for nausea or vomiting. (Patient not taking: Reported on 04/09/2020) 20 tablet 0  . traMADol (ULTRAM) 50 MG tablet Take 1 tablet (50 mg total) by mouth every 6 (six) hours as needed. (Patient not taking: Reported on 04/09/2020) 10 tablet 0   No facility-administered medications prior to visit.    Allergies  Allergen Reactions  . Trazodone And Nefazodone     Dizziness  . Zoloft [Sertraline Hcl]     Anxiousness, lash out at kids    ROS Review of Systems  Constitutional: Negative.   HENT: Negative.   Eyes: Negative for photophobia and visual disturbance.  Respiratory: Negative.   Cardiovascular: Negative.   Gastrointestinal: Negative.   Musculoskeletal: Negative.   Neurological: Negative for dizziness, light-headedness and headaches.  Hematological: Does not bruise/bleed easily.  Psychiatric/Behavioral: Negative.       Objective:    Physical Exam  Constitutional: She is oriented to person, place, and time. She appears well-developed and well-nourished. No distress.  HENT:  Head: Normocephalic and atraumatic.  Right Ear: External ear normal.  Left Ear: External ear normal.  Eyes: Conjunctivae are normal. Right eye exhibits no discharge. Left eye exhibits no discharge. No scleral icterus.  Neck: No JVD present. No tracheal deviation present.  Cardiovascular: Normal rate and regular rhythm.  Pulmonary/Chest: Effort normal and breath sounds normal. No stridor.  Musculoskeletal:        General: No edema.  Neurological: She is alert and oriented to person, place, and time.  Skin: Skin is warm and dry. She is not diaphoretic.  Psychiatric: She has a normal mood and affect. Her behavior is normal.    BP 100/80 (BP Location: Left Arm, Patient Position: Sitting, Cuff Size: Normal)   Pulse 73   Temp 98 F (36.7  C) (Temporal)   Ht 5\' 3"  (1.6 m)   Wt 140 lb 6.4 oz (63.7 kg)   LMP 03/15/2020   BMI 24.87 kg/m  Wt Readings from Last 3 Encounters:  04/09/20 140 lb 6.4 oz (63.7 kg)  03/20/20 138 lb 4.8 oz (62.7 kg)  03/12/20 141 lb 12.1 oz (64.3 kg)     Health Maintenance Due  Topic Date Due  . COVID-19 Vaccine (1) Never done  . HIV  Screening  Never done  . PAP SMEAR-Modifier  Never done    There are no preventive care reminders to display for this patient.  Lab Results  Component Value Date   TSH 2.57 05/04/2018   Lab Results  Component Value Date   WBC 5.1 02/14/2020   HGB 11.7 (L) 02/14/2020   HCT 36.1 02/14/2020   MCV 82.8 02/14/2020   PLT 308 02/14/2020   Lab Results  Component Value Date   NA 140 02/14/2020   K 3.8 02/14/2020   CO2 27 02/14/2020   GLUCOSE 107 (H) 02/14/2020   BUN 12 02/14/2020   CREATININE 0.77 02/14/2020   BILITOT <0.2 (L) 02/14/2020   ALKPHOS 81 02/14/2020   AST 12 (L) 02/14/2020   ALT 11 02/14/2020   PROT 7.3 02/14/2020   ALBUMIN 3.7 02/14/2020   CALCIUM 8.9 02/14/2020   ANIONGAP 7 02/14/2020   GFR 128.72 05/04/2018   Lab Results  Component Value Date   CHOL 180 05/04/2018   Lab Results  Component Value Date   HDL 82.70 05/04/2018   Lab Results  Component Value Date   LDLCALC 83 05/04/2018   Lab Results  Component Value Date   TRIG 68.0 05/04/2018   Lab Results  Component Value Date   CHOLHDL 2 05/04/2018   No results found for: HGBA1C    Assessment & Plan:   Problem List Items Addressed This Visit      Cardiovascular and Mediastinum   HTN (hypertension) - Primary   Relevant Medications   verapamil (CALAN SR) 180 MG CR tablet      Meds ordered this encounter  Medications  . verapamil (CALAN SR) 180 MG CR tablet    Sig: Take 1 tablet (180 mg total) by mouth daily. In the morning.    Dispense:  90 tablet    Refill:  1    Follow-up: Return in about 6 months (around 10/09/2020), or if symptoms worsen or fail to  improve.  Continue current medication.  May take it in the morning.  Follow-up in 6 months or sooner if she develops lightheadedness.  Libby Maw, MD

## 2020-04-09 NOTE — Patient Instructions (Addendum)
Health Maintenance Due  Topic Date Due  . COVID-19 Vaccine (1) Never done  . HIV Screening  Never done  . PAP SMEAR-Modifier  Never done    Depression screen [facility] 2/9 08/09/2018 05/04/2018  Decreased Interest 3 2  Down, Depressed, Hopeless 3 2  PHQ - 2 Score 6 4  Altered sleeping 2 3  Tired, decreased energy 2 3  Change in appetite 0 1  Feeling bad or failure about yourself  2 2  Trouble concentrating 0 0  Moving slowly or fidgety/restless 0 2  Suicidal thoughts 0 0  PHQ-9 Score 12 15  Difficult doing work/chores Very difficult Very difficult    Managing Your Hypertension Hypertension is commonly called high blood pressure. This is when the force of your blood pressing against the walls of your arteries is too strong. Arteries are blood vessels that carry blood from your heart throughout your body. Hypertension forces the heart to work harder to pump blood, and may cause the arteries to become narrow or stiff. Having untreated or uncontrolled hypertension can cause heart attack, stroke, kidney disease, and other problems. What are blood pressure readings? A blood pressure reading consists of a higher number over a lower number. Ideally, your blood pressure should be below 120/80. The first ("top") number is called the systolic pressure. It is a measure of the pressure in your arteries as your heart beats. The second ("bottom") number is called the diastolic pressure. It is a measure of the pressure in your arteries as the heart relaxes. What does my blood pressure reading mean? Blood pressure is classified into four stages. Based on your blood pressure reading, your health care provider may use the following stages to determine what type of treatment you need, if any. Systolic pressure and diastolic pressure are measured in a unit called mm Hg. Normal  Systolic pressure: below 123456.  Diastolic pressure: below 80. Elevated  Systolic pressure: [id number].  Diastolic pressure: below  80. Hypertension stage 1  Systolic pressure: 0000000.  Diastolic pressure: [id number]. Hypertension stage 2  Systolic pressure: [id number] or above.  Diastolic pressure: 90 or above. What health risks are associated with hypertension? Managing your hypertension is an important responsibility. Uncontrolled hypertension can lead to:  A heart attack.  A stroke.  A weakened blood vessel (aneurysm).  Heart failure.  Kidney damage.  Eye damage.  Metabolic syndrome.  Memory and concentration problems. What changes can I make to manage my hypertension? Hypertension can be managed by making lifestyle changes and possibly by taking medicines. Your health care provider will help you make a plan to bring your blood pressure within a normal range. Eating and drinking   Eat a diet that is high in fiber and potassium, and low in salt (sodium), added sugar, and fat. An example eating plan is called the DASH (Dietary Approaches to Stop Hypertension) diet. To eat this way: ? Eat plenty of fresh fruits and vegetables. Try to fill half of your plate at each meal with fruits and vegetables. ? Eat whole grains, such as whole wheat pasta, brown rice, or whole grain bread. Fill about one quarter of your plate with whole grains. ? Eat low-fat diary products. ? Avoid fatty cuts of meat, processed or cured meats, and poultry with skin. Fill about one quarter of your plate with lean proteins such as fish, chicken without skin, beans, eggs, and tofu. ? Avoid premade and processed foods. These tend to be higher in sodium, added sugar, and fat.  Reduce  your daily sodium intake. Most people with hypertension should eat less than 1,500 mg of sodium a day.  Limit alcohol intake to no more than 1 drink a day for nonpregnant women and 2 drinks a day for men. One drink equals 12 oz of beer, 5 oz of wine, or 1 oz of hard liquor. Lifestyle  Work with your health care provider to maintain a healthy body weight, or to  lose weight. Ask what an ideal weight is for you.  Get at least 30 minutes of exercise that causes your heart to beat faster (aerobic exercise) most days of the week. Activities may include walking, swimming, or biking.  Include exercise to strengthen your muscles (resistance exercise), such as weight lifting, as part of your weekly exercise routine. Try to do these types of exercises for 30 minutes at least 3 days a week.  Do not use any products that contain nicotine or tobacco, such as cigarettes and e-cigarettes. If you need help quitting, ask your health care provider.  Control any long-term (chronic) conditions you have, such as high cholesterol or diabetes. Monitoring  Monitor your blood pressure at home as told by your health care provider. Your personal target blood pressure may vary depending on your medical conditions, your age, and other factors.  Have your blood pressure checked regularly, as often as told by your health care provider. Working with your health care provider  Review all the medicines you take with your health care provider because there may be side effects or interactions.  Talk with your health care provider about your diet, exercise habits, and other lifestyle factors that may be contributing to hypertension.  Visit your health care provider regularly. Your health care provider can help you create and adjust your plan for managing hypertension. Will I need medicine to control my blood pressure? Your health care provider may prescribe medicine if lifestyle changes are not enough to get your blood pressure under control, and if:  Your systolic blood pressure is 130 or higher.  Your diastolic blood pressure is 80 or higher. Take medicines only as told by your health care provider. Follow the directions carefully. Blood pressure medicines must be taken as prescribed. The medicine does not work as well when you skip doses. Skipping doses also puts you at risk for  problems. Contact a health care provider if:  You think you are having a reaction to medicines you have taken.  You have repeated (recurrent) headaches.  You feel dizzy.  You have swelling in your ankles.  You have trouble with your vision. Get help right away if:  You develop a severe headache or confusion.  You have unusual weakness or numbness, or you feel faint.  You have severe pain in your chest or abdomen.  You vomit repeatedly.  You have trouble breathing. Summary  Hypertension is when the force of blood pumping through your arteries is too strong. If this condition is not controlled, it may put you at risk for serious complications.  Your personal target blood pressure may vary depending on your medical conditions, your age, and other factors. For most people, a normal blood pressure is less than 120/80.  Hypertension is managed by lifestyle changes, medicines, or both. Lifestyle changes include weight loss, eating a healthy, low-sodium diet, exercising more, and limiting alcohol. This information is not intended to replace advice given to you by your health care provider. Make sure you discuss any questions you have with your health care provider. Document  Revised: 02/17/2019 Document Reviewed: 09/23/2016 Elsevier Patient Education  [place].

## 2020-04-15 ENCOUNTER — Ambulatory Visit: Payer: No Typology Code available for payment source

## 2020-04-16 ENCOUNTER — Ambulatory Visit: Payer: No Typology Code available for payment source

## 2020-04-16 DIAGNOSIS — Z170 Estrogen receptor positive status [ER+]: Secondary | ICD-10-CM | POA: Insufficient documentation

## 2020-04-16 DIAGNOSIS — Z510 Encounter for antineoplastic radiation therapy: Secondary | ICD-10-CM | POA: Insufficient documentation

## 2020-04-16 DIAGNOSIS — D0512 Intraductal carcinoma in situ of left breast: Secondary | ICD-10-CM | POA: Diagnosis not present

## 2020-04-17 ENCOUNTER — Ambulatory Visit: Payer: No Typology Code available for payment source

## 2020-04-18 ENCOUNTER — Encounter: Payer: Self-pay | Admitting: Family Medicine

## 2020-04-18 ENCOUNTER — Ambulatory Visit: Payer: No Typology Code available for payment source

## 2020-04-19 ENCOUNTER — Ambulatory Visit: Payer: No Typology Code available for payment source

## 2020-04-22 ENCOUNTER — Ambulatory Visit
Admission: RE | Admit: 2020-04-22 | Discharge: 2020-04-22 | Disposition: A | Discharge status: Home / Self Care | Payer: No Typology Code available for payment source | Source: Ambulatory Visit | Attending: Radiation Oncology | Admitting: Radiation Oncology

## 2020-04-22 DIAGNOSIS — D0512 Intraductal carcinoma in situ of left breast: Secondary | ICD-10-CM | POA: Diagnosis not present

## 2020-04-23 ENCOUNTER — Ambulatory Visit
Admission: RE | Admit: 2020-04-23 | Discharge: 2020-04-23 | Disposition: A | Discharge status: Home / Self Care | Payer: No Typology Code available for payment source | Source: Ambulatory Visit | Attending: Radiation Oncology | Admitting: Radiation Oncology

## 2020-04-23 ENCOUNTER — Other Ambulatory Visit: Payer: Self-pay

## 2020-04-23 DIAGNOSIS — D0512 Intraductal carcinoma in situ of left breast: Secondary | ICD-10-CM | POA: Diagnosis not present

## 2020-04-24 ENCOUNTER — Other Ambulatory Visit: Payer: Self-pay

## 2020-04-24 ENCOUNTER — Encounter: Payer: Self-pay | Admitting: *Deleted

## 2020-04-24 ENCOUNTER — Ambulatory Visit
Admission: RE | Admit: 2020-04-24 | Discharge: 2020-04-24 | Disposition: A | Discharge status: Home / Self Care | Payer: No Typology Code available for payment source | Source: Ambulatory Visit | Attending: Radiation Oncology | Admitting: Radiation Oncology

## 2020-04-24 DIAGNOSIS — D0512 Intraductal carcinoma in situ of left breast: Secondary | ICD-10-CM | POA: Diagnosis not present

## 2020-04-25 ENCOUNTER — Ambulatory Visit
Admission: RE | Admit: 2020-04-25 | Discharge: 2020-04-25 | Disposition: A | Discharge status: Home / Self Care | Payer: No Typology Code available for payment source | Source: Ambulatory Visit | Attending: Radiation Oncology | Admitting: Radiation Oncology

## 2020-04-25 ENCOUNTER — Other Ambulatory Visit: Payer: Self-pay

## 2020-04-25 DIAGNOSIS — D0512 Intraductal carcinoma in situ of left breast: Secondary | ICD-10-CM | POA: Diagnosis not present

## 2020-04-25 NOTE — Progress Notes (Signed)
Pt here for patient teaching.  Pt given Radiation and You booklet, skin care instructions and Alra deodorant.  Reviewed areas of pertinence such as fatigue, hair loss, skin changes, breast tenderness and breast swelling . Pt able to give teach back of to pat skin and use unscented/gentle soap,avoid applying anything to skin within 4 hours of treatment, avoid wearing an under wire bra and to use an electric razor if they must shave. Pt verbalizes understanding of information given and will contact nursing with any questions or concerns.     Gloriajean Dell. Leonie Green, BSN

## 2020-04-26 ENCOUNTER — Other Ambulatory Visit: Payer: Self-pay

## 2020-04-26 ENCOUNTER — Ambulatory Visit
Admission: RE | Admit: 2020-04-26 | Discharge: 2020-04-26 | Disposition: A | Discharge status: Home / Self Care | Payer: No Typology Code available for payment source | Source: Ambulatory Visit | Attending: Radiation Oncology | Admitting: Radiation Oncology

## 2020-04-26 DIAGNOSIS — D0512 Intraductal carcinoma in situ of left breast: Secondary | ICD-10-CM | POA: Diagnosis not present

## 2020-04-26 MED ORDER — ALRA NON-METALLIC DEODORANT (RAD-ONC)
1.0000 "application " | Freq: Once | TOPICAL | Status: AC
  Administered 2020-04-26: 1 via TOPICAL

## 2020-04-26 MED ORDER — SONAFINE EX EMUL
1.0000 "application " | Freq: Once | CUTANEOUS | Status: AC
  Administered 2020-04-26: 1 via TOPICAL

## 2020-04-29 ENCOUNTER — Other Ambulatory Visit: Payer: Self-pay

## 2020-04-29 ENCOUNTER — Ambulatory Visit
Admission: RE | Admit: 2020-04-29 | Discharge: 2020-04-29 | Disposition: A | Discharge status: Home / Self Care | Payer: No Typology Code available for payment source | Source: Ambulatory Visit | Attending: Radiation Oncology | Admitting: Radiation Oncology

## 2020-04-29 DIAGNOSIS — D0512 Intraductal carcinoma in situ of left breast: Secondary | ICD-10-CM | POA: Diagnosis not present

## 2020-04-30 ENCOUNTER — Ambulatory Visit
Admission: RE | Admit: 2020-04-30 | Discharge: 2020-04-30 | Disposition: A | Discharge status: Home / Self Care | Payer: No Typology Code available for payment source | Source: Ambulatory Visit | Attending: Radiation Oncology | Admitting: Radiation Oncology

## 2020-04-30 ENCOUNTER — Other Ambulatory Visit: Payer: Self-pay

## 2020-04-30 DIAGNOSIS — D0512 Intraductal carcinoma in situ of left breast: Secondary | ICD-10-CM | POA: Diagnosis not present

## 2020-05-01 ENCOUNTER — Other Ambulatory Visit: Payer: Self-pay

## 2020-05-01 ENCOUNTER — Ambulatory Visit
Admission: RE | Admit: 2020-05-01 | Discharge: 2020-05-01 | Disposition: A | Discharge status: Home / Self Care | Payer: No Typology Code available for payment source | Source: Ambulatory Visit | Attending: Radiation Oncology | Admitting: Radiation Oncology

## 2020-05-01 DIAGNOSIS — D0512 Intraductal carcinoma in situ of left breast: Secondary | ICD-10-CM | POA: Diagnosis not present

## 2020-05-02 ENCOUNTER — Other Ambulatory Visit: Payer: Self-pay

## 2020-05-02 ENCOUNTER — Ambulatory Visit
Admission: RE | Admit: 2020-05-02 | Discharge: 2020-05-02 | Disposition: A | Discharge status: Home / Self Care | Payer: No Typology Code available for payment source | Source: Ambulatory Visit | Attending: Radiation Oncology | Admitting: Radiation Oncology

## 2020-05-02 DIAGNOSIS — D0512 Intraductal carcinoma in situ of left breast: Secondary | ICD-10-CM | POA: Diagnosis not present

## 2020-05-03 ENCOUNTER — Other Ambulatory Visit: Payer: Self-pay

## 2020-05-03 ENCOUNTER — Ambulatory Visit
Admission: RE | Admit: 2020-05-03 | Discharge: 2020-05-03 | Disposition: A | Discharge status: Home / Self Care | Payer: No Typology Code available for payment source | Source: Ambulatory Visit | Attending: Radiation Oncology | Admitting: Radiation Oncology

## 2020-05-03 DIAGNOSIS — D0512 Intraductal carcinoma in situ of left breast: Secondary | ICD-10-CM | POA: Diagnosis not present

## 2020-05-06 ENCOUNTER — Other Ambulatory Visit: Payer: Self-pay

## 2020-05-06 ENCOUNTER — Ambulatory Visit
Admission: RE | Admit: 2020-05-06 | Discharge: 2020-05-06 | Disposition: A | Discharge status: Home / Self Care | Payer: No Typology Code available for payment source | Source: Ambulatory Visit | Attending: Radiation Oncology | Admitting: Radiation Oncology

## 2020-05-06 DIAGNOSIS — D0512 Intraductal carcinoma in situ of left breast: Secondary | ICD-10-CM | POA: Diagnosis not present

## 2020-05-07 ENCOUNTER — Other Ambulatory Visit: Payer: Self-pay

## 2020-05-07 ENCOUNTER — Ambulatory Visit
Admission: RE | Admit: 2020-05-07 | Discharge: 2020-05-07 | Disposition: A | Discharge status: Home / Self Care | Payer: No Typology Code available for payment source | Source: Ambulatory Visit | Attending: Radiation Oncology | Admitting: Radiation Oncology

## 2020-05-07 DIAGNOSIS — D0512 Intraductal carcinoma in situ of left breast: Secondary | ICD-10-CM | POA: Diagnosis not present

## 2020-05-08 ENCOUNTER — Ambulatory Visit
Admission: RE | Admit: 2020-05-08 | Discharge: 2020-05-08 | Disposition: A | Discharge status: Home / Self Care | Payer: No Typology Code available for payment source | Source: Ambulatory Visit | Attending: Radiation Oncology | Admitting: Radiation Oncology

## 2020-05-08 ENCOUNTER — Other Ambulatory Visit: Payer: Self-pay

## 2020-05-08 DIAGNOSIS — D0512 Intraductal carcinoma in situ of left breast: Secondary | ICD-10-CM | POA: Diagnosis not present

## 2020-05-09 ENCOUNTER — Ambulatory Visit
Admission: RE | Admit: 2020-05-09 | Discharge: 2020-05-09 | Disposition: A | Discharge status: Home / Self Care | Payer: No Typology Code available for payment source | Source: Ambulatory Visit | Attending: Radiation Oncology | Admitting: Radiation Oncology

## 2020-05-09 ENCOUNTER — Other Ambulatory Visit: Payer: Self-pay

## 2020-05-09 DIAGNOSIS — D0512 Intraductal carcinoma in situ of left breast: Secondary | ICD-10-CM | POA: Insufficient documentation

## 2020-05-09 DIAGNOSIS — Z79899 Other long term (current) drug therapy: Secondary | ICD-10-CM | POA: Diagnosis not present

## 2020-05-09 DIAGNOSIS — Z923 Personal history of irradiation: Secondary | ICD-10-CM | POA: Diagnosis not present

## 2020-05-09 DIAGNOSIS — Z510 Encounter for antineoplastic radiation therapy: Secondary | ICD-10-CM | POA: Insufficient documentation

## 2020-05-09 DIAGNOSIS — Z170 Estrogen receptor positive status [ER+]: Secondary | ICD-10-CM | POA: Insufficient documentation

## 2020-05-10 ENCOUNTER — Ambulatory Visit
Admission: RE | Admit: 2020-05-10 | Discharge: 2020-05-10 | Disposition: A | Discharge status: Home / Self Care | Payer: No Typology Code available for payment source | Source: Ambulatory Visit | Attending: Radiation Oncology | Admitting: Radiation Oncology

## 2020-05-10 ENCOUNTER — Other Ambulatory Visit: Payer: Self-pay

## 2020-05-10 DIAGNOSIS — D0512 Intraductal carcinoma in situ of left breast: Secondary | ICD-10-CM | POA: Diagnosis not present

## 2020-05-14 ENCOUNTER — Other Ambulatory Visit: Payer: Self-pay

## 2020-05-14 ENCOUNTER — Ambulatory Visit
Admission: RE | Admit: 2020-05-14 | Discharge: 2020-05-14 | Disposition: A | Discharge status: Home / Self Care | Payer: No Typology Code available for payment source | Source: Ambulatory Visit | Attending: Radiation Oncology | Admitting: Radiation Oncology

## 2020-05-14 DIAGNOSIS — D0512 Intraductal carcinoma in situ of left breast: Secondary | ICD-10-CM | POA: Diagnosis not present

## 2020-05-15 ENCOUNTER — Ambulatory Visit
Admission: RE | Admit: 2020-05-15 | Discharge: 2020-05-15 | Disposition: A | Discharge status: Home / Self Care | Payer: No Typology Code available for payment source | Source: Ambulatory Visit | Attending: Radiation Oncology | Admitting: Radiation Oncology

## 2020-05-15 ENCOUNTER — Other Ambulatory Visit: Payer: Self-pay

## 2020-05-15 DIAGNOSIS — D0512 Intraductal carcinoma in situ of left breast: Secondary | ICD-10-CM | POA: Diagnosis not present

## 2020-05-16 ENCOUNTER — Other Ambulatory Visit: Payer: Self-pay

## 2020-05-16 ENCOUNTER — Ambulatory Visit
Admission: RE | Admit: 2020-05-16 | Discharge: 2020-05-16 | Disposition: A | Discharge status: Home / Self Care | Payer: No Typology Code available for payment source | Source: Ambulatory Visit | Attending: Radiation Oncology | Admitting: Radiation Oncology

## 2020-05-16 DIAGNOSIS — D0512 Intraductal carcinoma in situ of left breast: Secondary | ICD-10-CM | POA: Diagnosis not present

## 2020-05-17 ENCOUNTER — Ambulatory Visit
Admission: RE | Admit: 2020-05-17 | Discharge: 2020-05-17 | Disposition: A | Discharge status: Home / Self Care | Payer: No Typology Code available for payment source | Source: Ambulatory Visit | Attending: Radiation Oncology | Admitting: Radiation Oncology

## 2020-05-17 ENCOUNTER — Other Ambulatory Visit: Payer: Self-pay

## 2020-05-17 DIAGNOSIS — D0512 Intraductal carcinoma in situ of left breast: Secondary | ICD-10-CM | POA: Diagnosis not present

## 2020-05-20 ENCOUNTER — Ambulatory Visit
Admission: RE | Admit: 2020-05-20 | Discharge: 2020-05-20 | Disposition: A | Discharge status: Home / Self Care | Payer: No Typology Code available for payment source | Source: Ambulatory Visit | Attending: Radiation Oncology | Admitting: Radiation Oncology

## 2020-05-20 ENCOUNTER — Other Ambulatory Visit: Payer: Self-pay

## 2020-05-20 DIAGNOSIS — D0512 Intraductal carcinoma in situ of left breast: Secondary | ICD-10-CM | POA: Diagnosis not present

## 2020-05-21 ENCOUNTER — Ambulatory Visit
Admission: RE | Admit: 2020-05-21 | Discharge: 2020-05-21 | Disposition: A | Discharge status: Home / Self Care | Payer: No Typology Code available for payment source | Source: Ambulatory Visit | Attending: Radiation Oncology | Admitting: Radiation Oncology

## 2020-05-21 DIAGNOSIS — D0512 Intraductal carcinoma in situ of left breast: Secondary | ICD-10-CM | POA: Diagnosis not present

## 2020-05-22 ENCOUNTER — Other Ambulatory Visit: Payer: Self-pay

## 2020-05-22 ENCOUNTER — Ambulatory Visit
Admission: RE | Admit: 2020-05-22 | Discharge: 2020-05-22 | Disposition: A | Discharge status: Home / Self Care | Payer: No Typology Code available for payment source | Source: Ambulatory Visit | Attending: Radiation Oncology | Admitting: Radiation Oncology

## 2020-05-22 DIAGNOSIS — D0512 Intraductal carcinoma in situ of left breast: Secondary | ICD-10-CM | POA: Diagnosis not present

## 2020-05-23 ENCOUNTER — Ambulatory Visit
Admission: RE | Admit: 2020-05-23 | Discharge: 2020-05-23 | Disposition: A | Discharge status: Home / Self Care | Payer: No Typology Code available for payment source | Source: Ambulatory Visit | Attending: Radiation Oncology | Admitting: Radiation Oncology

## 2020-05-23 ENCOUNTER — Other Ambulatory Visit: Payer: Self-pay

## 2020-05-23 DIAGNOSIS — D0512 Intraductal carcinoma in situ of left breast: Secondary | ICD-10-CM | POA: Diagnosis not present

## 2020-05-24 ENCOUNTER — Ambulatory Visit: Payer: No Typology Code available for payment source | Admitting: Radiation Oncology

## 2020-05-24 ENCOUNTER — Other Ambulatory Visit: Payer: Self-pay

## 2020-05-24 ENCOUNTER — Ambulatory Visit
Admission: RE | Admit: 2020-05-24 | Discharge: 2020-05-24 | Disposition: A | Discharge status: Home / Self Care | Payer: No Typology Code available for payment source | Source: Ambulatory Visit | Attending: Radiation Oncology | Admitting: Radiation Oncology

## 2020-05-24 DIAGNOSIS — D0512 Intraductal carcinoma in situ of left breast: Secondary | ICD-10-CM | POA: Diagnosis not present

## 2020-05-26 NOTE — Progress Notes (Signed)
Patient Care Team: Libby Maw, MD as PCP - General (Family Medicine) Mauro Kaufmann, RN as Oncology Nurse Navigator Rockwell Germany, RN as Registered Nurse Rolm Bookbinder, MD as Consulting Physician (General Surgery) Nicholas Lose, MD as Consulting Physician (Hematology and Oncology) Kyung Rudd, MD as Consulting Physician (Radiation Oncology)  DIAGNOSIS:    ICD-10-CM   1. Ductal carcinoma in situ (DCIS) of left breast  D05.12     SUMMARY OF ONCOLOGIC HISTORY: Oncology History  Ductal carcinoma in situ (DCIS) of left breast  02/08/2020 Initial Diagnosis   Screening mammogram detected left breast calcifications, 2.9cm. Biopsy showed DCIS with calcifications, low grade, ER+ 70%, PR+ 90%.   02/20/2020 Genetic Testing   Negative genetic testing:  No pathogenic variants detected on the [facility] Breast Cancer STAT or Multi-Cancer Panels. The report date is 02/20/2020.  The Breast Cancer STAT Panel offered by [facility] includes sequencing and deletion/duplication analysis for the following 9 genes:  ATM, BRCA1, BRCA2, CDH1, CHEK2, PALB2, PTEN, STK11 and TP53.  The Multi-Cancer Panel offered by [facility] includes sequencing and/or deletion duplication testing of the following 85 genes: AIP, ALK, APC, ATM, AXIN2,BAP1,  BARD1, BLM, BMPR1A, BRCA1, BRCA2, BRIP1, CASR, CDC73, CDH1, CDK4, CDKN1B, CDKN1C, CDKN2A (p14ARF), CDKN2A (p16INK4a), CEBPA, CHEK2, CTNNA1, DICER1, DIS3L2, EGFR (c.2369C>T, p.Thr790Met variant only), EPCAM (Deletion/duplication testing only), FH, FLCN, GATA2, GPC3, GREM1 (Promoter region deletion/duplication testing only), HOXB13 (c.251G>A, p.Gly84Glu), HRAS, KIT, MAX, MEN1, MET, MITF (c.952G>A, p.Glu318Lys variant only), MLH1, MSH2, MSH3, MSH6, MUTYH, NBN, NF1, NF2, NTHL1, PALB2, PDGFRA, PHOX2B, PMS2, POLD1, POLE, POT1, PRKAR1A, PTCH1, PTEN, RAD50, RAD51C, RAD51D, RB1, RECQL4, RET, RNF43, RUNX1, SDHAF2, SDHA (sequence changes only), SDHB, SDHC, SDHD, SMAD4, SMARCA4, SMARCB1,  SMARCE1, STK11, SUFU, TERC, TERT, TMEM127, TP53, TSC1, TSC2, VHL, WRN and WT1.   03/12/2020 Surgery   Left lumpectomy Victoria Maddox): flat epithelial atypia with calcifications.    04/23/2020 -  Radiation Therapy   Adjuvant radiation     CHIEF COMPLIANT: Follow-up of left breast DCIS to discuss antiestrogen therapy  INTERVAL HISTORY: Victoria Maddox is a 44 y.o. with above-mentioned history of left breast DCIS who underwent a left lumpectomy and is currently undergoing radiation. She presents to the clinic today to discuss antiestrogen therapy.    ALLERGIES:  is allergic to trazodone and nefazodone and zoloft [sertraline hcl].  MEDICATIONS:  Current Outpatient Medications  Medication Sig Dispense Refill  . acetaminophen (TYLENOL) 325 MG tablet Take 650 mg by mouth every 6 (six) hours as needed.    . diphenhydrAMINE (BENADRYL) 25 mg capsule Take 25 mg by mouth at bedtime as needed.    . hydrochlorothiazide (HYDRODIURIL) 25 MG tablet TAKE 1 TABLET (25 MG TOTAL) BY MOUTH DAILY. (Patient not taking: Reported on 04/04/2020) 90 tablet 1  . ondansetron (ZOFRAN) 4 MG tablet Take 1 tablet (4 mg total) by mouth every 8 (eight) hours as needed for nausea or vomiting. (Patient not taking: Reported on 04/09/2020) 20 tablet 0  . traMADol (ULTRAM) 50 MG tablet Take 1 tablet (50 mg total) by mouth every 6 (six) hours as needed. (Patient not taking: Reported on 04/09/2020) 10 tablet 0  . verapamil (CALAN SR) 180 MG CR tablet Take 1 tablet (180 mg total) by mouth daily. In the morning. 90 tablet 1   No current facility-administered medications for this visit.    PHYSICAL EXAMINATION: ECOG PERFORMANCE STATUS: 1 - Symptomatic but completely ambulatory  Vitals:   05/27/20 1511  BP: 123/77  Pulse: 73  Resp: 18  Temp: 98.9  F (37.2 C)  SpO2: 100%   Filed Weights   05/27/20 1511  Weight: 141 lb 6.4 oz (64.1 kg)    LABORATORY DATA:  I have reviewed the data as listed CMP Latest Ref Rng & Units 02/14/2020  05/04/2018  Glucose 70 - 99 mg/dL 107(H) 110(H)  BUN 6 - 20 mg/dL 12 13  Creatinine 0.44 - 1.00 mg/dL 0.77 0.65  Sodium 135 - 145 mmol/L 140 136  Potassium 3.5 - 5.1 mmol/L 3.8 3.7  Chloride 98 - 111 mmol/L 106 102  CO2 22 - 32 mmol/L 27 28  Calcium 8.9 - 10.3 mg/dL 8.9 9.5  Total Protein 6.5 - 8.1 g/dL 7.3 7.3  Total Bilirubin 0.3 - 1.2 mg/dL <0.2(L) 0.3  Alkaline Phos 38 - 126 U/L 81 54  AST 15 - 41 U/L 12(L) 14  ALT 0 - 44 U/L 11 15    Lab Results  Component Value Date   WBC 5.1 02/14/2020   HGB 11.7 (L) 02/14/2020   HCT 36.1 02/14/2020   MCV 82.8 02/14/2020   PLT 308 02/14/2020   NEUTROABS 2.1 02/14/2020    ASSESSMENT & PLAN:  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:Screening mammogram detected left breast calcifications, 2.9cm. 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  Recommendation: 1. adjuvant radiation therapy to be completed 06/06/2020 2. Followed by antiestrogen therapy with tamoxifen 5 years  We discussed the risks and benefits of tamoxifen. These include but not limited to insomnia, hot flashes, mood changes, vaginal dryness, and weight gain. Although rare, serious side effects including endometrial cancer, risk of blood clots were also discussed. We strongly believe that the benefits far outweigh the risks. Patient understands these risks and consented to starting treatment. Planned treatment duration is 5 years.  I counseled her about antiestrogen therapy compliance study Return to clinic in 3 months for survivorship care plan visit.      No orders of the defined types were placed in this encounter.  The patient has a good understanding of the overall plan. she agrees with it. she will call with any problems that may develop before the next visit  here.  Total time spent: 30 mins including face to face time and time spent for planning, charting and coordination of care  Nicholas Lose, MD 05/27/2020  I, Cloyde Reams Dorshimer, am acting as scribe for Dr. Nicholas Lose.  I have reviewed the above documentation for accuracy and completeness, and I agree with the above.

## 2020-05-27 ENCOUNTER — Ambulatory Visit
Admission: RE | Admit: 2020-05-27 | Discharge: 2020-05-27 | Disposition: A | Discharge status: Home / Self Care | Payer: No Typology Code available for payment source | Source: Ambulatory Visit | Attending: Radiation Oncology | Admitting: Radiation Oncology

## 2020-05-27 ENCOUNTER — Inpatient Hospital Stay
Payer: No Typology Code available for payment source | Attending: Hematology and Oncology | Admitting: Hematology and Oncology

## 2020-05-27 ENCOUNTER — Other Ambulatory Visit: Payer: Self-pay

## 2020-05-27 ENCOUNTER — Other Ambulatory Visit: Payer: Self-pay | Admitting: Hematology and Oncology

## 2020-05-27 DIAGNOSIS — D0512 Intraductal carcinoma in situ of left breast: Secondary | ICD-10-CM | POA: Insufficient documentation

## 2020-05-27 DIAGNOSIS — Z923 Personal history of irradiation: Secondary | ICD-10-CM | POA: Insufficient documentation

## 2020-05-27 DIAGNOSIS — Z170 Estrogen receptor positive status [ER+]: Secondary | ICD-10-CM | POA: Insufficient documentation

## 2020-05-27 DIAGNOSIS — Z79899 Other long term (current) drug therapy: Secondary | ICD-10-CM | POA: Insufficient documentation

## 2020-05-27 MED ORDER — TAMOXIFEN CITRATE 20 MG PO TABS
20.0000 mg | ORAL_TABLET | Freq: Every day | ORAL | 3 refills | Status: DC
Start: 2020-06-28 — End: 2020-06-28

## 2020-05-27 MED FILL — TAMOXIFEN 20 MG TABLET: 20 | 90 days supply | Qty: 90 | Fill #0

## 2020-05-27 NOTE — Assessment & Plan Note (Signed)
02/08/2020:Screening mammogram detected left breast calcifications, 2.9cm. 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  Recommendation: 1. adjuvant radiation therapy to be completed 06/06/2020 2. Followed by antiestrogen therapy with tamoxifen 5 years  We discussed the risks and benefits of tamoxifen. These include but not limited to insomnia, hot flashes, mood changes, vaginal dryness, and weight gain. Although rare, serious side effects including endometrial cancer, risk of blood clots were also discussed. We strongly believe that the benefits far outweigh the risks. Patient understands these risks and consented to starting treatment. Planned treatment duration is 5 years.  I counseled her about antiestrogen therapy compliance study Return to clinic in 3 months for survivorship care plan visit.

## 2020-05-28 ENCOUNTER — Ambulatory Visit: Payer: No Typology Code available for payment source

## 2020-05-28 ENCOUNTER — Telehealth: Payer: Self-pay | Admitting: Hematology and Oncology

## 2020-05-28 ENCOUNTER — Encounter: Payer: Self-pay | Admitting: *Deleted

## 2020-05-28 NOTE — Telephone Encounter (Signed)
Scheduled per 7/19 los. Called and spoke with pt, confirmed added appts

## 2020-05-29 ENCOUNTER — Ambulatory Visit
Admission: RE | Admit: 2020-05-29 | Discharge: 2020-05-29 | Disposition: A | Discharge status: Home / Self Care | Payer: No Typology Code available for payment source | Source: Ambulatory Visit | Attending: Radiation Oncology | Admitting: Radiation Oncology

## 2020-05-29 DIAGNOSIS — D0512 Intraductal carcinoma in situ of left breast: Secondary | ICD-10-CM | POA: Diagnosis not present

## 2020-05-30 ENCOUNTER — Ambulatory Visit
Admission: RE | Admit: 2020-05-30 | Discharge: 2020-05-30 | Disposition: A | Discharge status: Home / Self Care | Payer: No Typology Code available for payment source | Source: Ambulatory Visit | Attending: Radiation Oncology | Admitting: Radiation Oncology

## 2020-05-30 ENCOUNTER — Ambulatory Visit: Payer: No Typology Code available for payment source

## 2020-05-30 ENCOUNTER — Other Ambulatory Visit: Payer: Self-pay

## 2020-05-30 DIAGNOSIS — D0512 Intraductal carcinoma in situ of left breast: Secondary | ICD-10-CM | POA: Diagnosis not present

## 2020-05-31 ENCOUNTER — Ambulatory Visit: Payer: No Typology Code available for payment source

## 2020-05-31 ENCOUNTER — Ambulatory Visit
Admission: RE | Admit: 2020-05-31 | Discharge: 2020-05-31 | Disposition: A | Discharge status: Home / Self Care | Payer: No Typology Code available for payment source | Source: Ambulatory Visit | Attending: Radiation Oncology | Admitting: Radiation Oncology

## 2020-05-31 ENCOUNTER — Other Ambulatory Visit: Payer: Self-pay

## 2020-05-31 DIAGNOSIS — D0512 Intraductal carcinoma in situ of left breast: Secondary | ICD-10-CM | POA: Diagnosis not present

## 2020-06-02 ENCOUNTER — Ambulatory Visit: Payer: No Typology Code available for payment source

## 2020-06-03 ENCOUNTER — Ambulatory Visit
Admission: RE | Admit: 2020-06-03 | Discharge: 2020-06-03 | Disposition: A | Discharge status: Home / Self Care | Payer: No Typology Code available for payment source | Source: Ambulatory Visit | Attending: Radiation Oncology | Admitting: Radiation Oncology

## 2020-06-03 ENCOUNTER — Ambulatory Visit: Payer: No Typology Code available for payment source

## 2020-06-03 DIAGNOSIS — D0512 Intraductal carcinoma in situ of left breast: Secondary | ICD-10-CM | POA: Diagnosis not present

## 2020-06-04 ENCOUNTER — Other Ambulatory Visit: Payer: Self-pay

## 2020-06-04 ENCOUNTER — Ambulatory Visit
Admission: RE | Admit: 2020-06-04 | Discharge: 2020-06-04 | Disposition: A | Discharge status: Home / Self Care | Payer: No Typology Code available for payment source | Source: Ambulatory Visit | Attending: Radiation Oncology | Admitting: Radiation Oncology

## 2020-06-04 DIAGNOSIS — D0512 Intraductal carcinoma in situ of left breast: Secondary | ICD-10-CM | POA: Diagnosis not present

## 2020-06-05 ENCOUNTER — Other Ambulatory Visit: Payer: Self-pay

## 2020-06-05 ENCOUNTER — Ambulatory Visit
Admission: RE | Admit: 2020-06-05 | Discharge: 2020-06-05 | Disposition: A | Discharge status: Home / Self Care | Payer: No Typology Code available for payment source | Source: Ambulatory Visit | Attending: Radiation Oncology | Admitting: Radiation Oncology

## 2020-06-05 DIAGNOSIS — D0512 Intraductal carcinoma in situ of left breast: Secondary | ICD-10-CM | POA: Diagnosis not present

## 2020-06-05 MED FILL — TAMOXIFEN 20 MG TABLET: 20 | 90 days supply | Qty: 90 | Fill #0

## 2020-06-05 MED FILL — VERAPAMIL HCL ER 180 MG TBC: 180 | 90 days supply | Qty: 90 | Fill #0

## 2020-06-06 ENCOUNTER — Ambulatory Visit
Admission: RE | Admit: 2020-06-06 | Discharge: 2020-06-06 | Disposition: A | Discharge status: Home / Self Care | Payer: No Typology Code available for payment source | Source: Ambulatory Visit | Attending: Radiation Oncology | Admitting: Radiation Oncology

## 2020-06-06 ENCOUNTER — Other Ambulatory Visit: Payer: Self-pay

## 2020-06-06 ENCOUNTER — Ambulatory Visit: Payer: No Typology Code available for payment source

## 2020-06-06 ENCOUNTER — Encounter: Payer: Self-pay | Admitting: *Deleted

## 2020-06-06 DIAGNOSIS — D0512 Intraductal carcinoma in situ of left breast: Secondary | ICD-10-CM | POA: Diagnosis not present

## 2020-06-07 ENCOUNTER — Other Ambulatory Visit: Payer: Self-pay

## 2020-06-07 ENCOUNTER — Ambulatory Visit
Admission: RE | Admit: 2020-06-07 | Discharge: 2020-06-07 | Disposition: A | Discharge status: Home / Self Care | Payer: No Typology Code available for payment source | Source: Ambulatory Visit | Attending: Radiation Oncology | Admitting: Radiation Oncology

## 2020-06-07 ENCOUNTER — Encounter: Payer: Self-pay | Admitting: Radiation Oncology

## 2020-06-07 DIAGNOSIS — D0512 Intraductal carcinoma in situ of left breast: Secondary | ICD-10-CM | POA: Diagnosis not present

## 2020-06-12 ENCOUNTER — Telehealth: Payer: Self-pay | Admitting: *Deleted

## 2020-06-12 NOTE — Telephone Encounter (Signed)
"  Victoria Maddox [phone]).  Finished radiation last Friday.  Supposed to return to work on  The sixth, however my skin is clogged and itchy.  I do not think I will be able to work so I want to extend to next week.  Please call me back to let me know if I need to submit a form to my disability and leave department."  Message with instructions to contact disability and leave department.  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  Intermittent leave is also an option provided with current leave request.  Requested return call to confirm further needs.Marland Kitchen

## 2020-06-21 ENCOUNTER — Encounter: Payer: Self-pay | Admitting: *Deleted

## 2020-06-24 NOTE — Progress Notes (Signed)
  Radiation Oncology         [phone] ________________________________  Name: Victoria Maddox MRN: [id number]  Date: 06/07/2020  DOB: [birth year]  End of Treatment Note  Diagnosis:   left-sided breast cancer     Indication for treatment:  Curative       Radiation treatment dates:   04/22/20 - 06/07/20  Site/dose:   The patient initially received a dose of 50.4 Gy in 28 fractions to the breast using whole-breast tangent fields. This was delivered using a 3-D conformal technique. The patient then received a boost to the seroma. This delivered an additional 10 Gy in 5 fractions using a 3-field photon boost technique. The total dose was 60.4 Gy.  Narrative: The patient tolerated radiation treatment relatively well.   The patient had some expected skin irritation as she progressed during treatment.    Plan: The patient has completed radiation treatment. The patient will return to radiation oncology clinic for routine followup in one month. I advised the patient to call or return sooner if they have any questions or concerns related to their recovery or treatment. ________________________________  Jodelle Gross, M.D., Ph.D.

## 2020-07-01 ENCOUNTER — Telehealth: Payer: Self-pay | Admitting: Radiation Oncology

## 2020-07-01 ENCOUNTER — Encounter: Payer: Self-pay | Admitting: Radiation Oncology

## 2020-07-01 NOTE — Telephone Encounter (Signed)
  Radiation Oncology         [phone] ________________________________  Name: Victoria Maddox MRN: [id number]  Date of Service: 07/01/2020  DOB: June 13, 1976  Post Treatment Telephone Note  Diagnosis:  Low-grade ER/PR positive DCIS of the left breast.  Interval Since Last Radiation:  4 weeks    04/22/20 - 06/07/20: The patient initially received a dose of 50.4 Gy in 28 fractions to the breast using whole-breast tangent fields. This was delivered using a 3-D conformal technique. The patient then received a boost to the seroma. This delivered an additional 10 Gy in 5 fractions using a 3-field photon boost technique. The total dose was 60.4 Gy.  Narrative:  The patient was contacted today for routine follow-up. During treatment she did very well with radiotherapy and did not have significant desquamation. She reports she is doing well overall with her skin but is still having significant treatment related fatigue. 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 Low-grade ER/PR positive DCIS of the left breast. The patient has been doing well since completion of radiotherapy. We discussed that we would be happy to continue to follow her as needed, but she will also continue to follow up with Dr. Lindi Adie in medical oncology. She was counseled on skin care as well as measures to avoid sun exposure to this area.  2. Radiation related fatigue. The patient is aware that in some cases treatment related fatigue can linger sometimes until 2 months after completing treatment. I suspect her night shift schedule is complicating this. A letter is provided to her and I've also suggested that physical activity may improve this as well. We will follow this expectantly.    Carola Rhine, PAC

## 2020-08-23 ENCOUNTER — Other Ambulatory Visit: Payer: Self-pay

## 2020-08-23 ENCOUNTER — Encounter (HOSPITAL_COMMUNITY): Payer: Self-pay

## 2020-08-23 ENCOUNTER — Ambulatory Visit (HOSPITAL_COMMUNITY)
Admission: EM | Admit: 2020-08-23 | Discharge: 2020-08-23 | Disposition: A | Discharge status: Home / Self Care | Payer: No Typology Code available for payment source | Attending: Family Medicine | Admitting: Family Medicine

## 2020-08-23 DIAGNOSIS — J069 Acute upper respiratory infection, unspecified: Secondary | ICD-10-CM | POA: Diagnosis not present

## 2020-08-23 DIAGNOSIS — Z1152 Encounter for screening for COVID-19: Secondary | ICD-10-CM | POA: Diagnosis not present

## 2020-08-23 LAB — RESPIRATORY PANEL BY RT PCR (FLU A&B, COVID)
Influenza A by PCR: NEGATIVE
Influenza B by PCR: NEGATIVE
SARS Coronavirus 2 by RT PCR: POSITIVE — AB

## 2020-08-23 MED ORDER — FLUTICASONE PROPIONATE 50 MCG/ACT NA SUSP: 1.0000 | Freq: Every day | NASAL | 0 refills | Status: DC

## 2020-08-23 MED ORDER — BENZONATATE 200 MG PO CAPS
200.0000 mg | ORAL_CAPSULE | Freq: Three times a day (TID) | ORAL | 0 refills | Status: DC | PRN
Start: 2020-08-23 — End: 2020-08-28

## 2020-08-23 MED ORDER — IBUPROFEN 600 MG PO TABS
600.0000 mg | ORAL_TABLET | Freq: Four times a day (QID) | ORAL | 0 refills | Status: DC | PRN
Start: 2020-08-23 — End: 2021-07-31

## 2020-08-23 MED ORDER — BENZONATATE 200 MG PO CAPS
200.0000 mg | ORAL_CAPSULE | Freq: Three times a day (TID) | ORAL | 0 refills | Status: DC | PRN
Start: 2020-08-23 — End: 2020-08-23

## 2020-08-23 MED ORDER — IBUPROFEN 600 MG PO TABS
600.0000 mg | ORAL_TABLET | Freq: Four times a day (QID) | ORAL | 0 refills | Status: DC | PRN
Start: 2020-08-23 — End: 2020-08-23

## 2020-08-23 MED ORDER — ACETAMINOPHEN 325 MG PO TABS: 650.0000 mg | ORAL_TABLET | Freq: Once | ORAL | Status: DC

## 2020-08-23 NOTE — ED Provider Notes (Signed)
[place]    CSN: [id number] Arrival date & time: 08/23/20  1640      History   Chief Complaint Chief Complaint  Patient presents with   Cough   Generalized Body Aches    HPI Victoria Maddox is a 44 y.o. female history of hypertension, presenting today for evaluation of URI symptoms.  Patient reports over the past 1 to 2 days she has had cough congestion body aches and headaches.  She has had hot and cold chills and felt to subjective fevers.  Reports children with similar symptoms, none of been tested for Covid.  Using over-the-counter DayQuil/NyQuil and combination cold and flu medicine without relief.  Denies GI symptoms.  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      Home Medications    Prior to Admission medications   Medication Sig Start Date End Date Taking? Authorizing Provider    acetaminophen (TYLENOL) 325 MG tablet Take 650 mg by mouth every 6 (six) hours as needed.    [provider]  benzonatate (TESSALON) 200 MG capsule Take 1 capsule (200 mg total) by mouth 3 (three) times daily as needed for up to 7 days for cough. 08/23/20 08/30/20  Mirely Pangle C, PA-C  diphenhydrAMINE (BENADRYL) 25 mg capsule Take 25 mg by mouth at bedtime as needed.    [provider]  fluticasone (FLONASE) 50 MCG/ACT nasal spray Place 1-2 sprays into both nostrils daily for 7 days. 08/23/20 08/30/20  Lilya Smitherman C, PA-C  hydrochlorothiazide (HYDRODIURIL) 25 MG tablet TAKE 1 TABLET (25 MG TOTAL) BY MOUTH DAILY. Patient not taking: Reported on 04/04/2020 11/27/19   Lucille Passy, MD  ibuprofen (ADVIL) 600 MG tablet Take 1 tablet (600 mg total) by mouth every 6 (six) hours as needed. 08/23/20   Jarelle Ates C, PA-C  ondansetron (ZOFRAN) 4 MG tablet Take 1 tablet (4 mg total) by mouth every 8 (eight) hours as needed for nausea or vomiting. Patient not taking: Reported on 04/09/2020 09/30/19   Ann Held, DO  tamoxifen (NOLVADEX) 20 MG tablet Take 1 tablet (20 mg total) by mouth daily. 06/28/20   Nicholas Lose, MD  traMADol (ULTRAM) 50 MG tablet Take 1 tablet (50 mg total) by mouth every 6 (six) hours as needed.  Patient not taking: Reported on 04/09/2020 03/12/20   Rolm Bookbinder, MD  verapamil (CALAN SR) 180 MG CR tablet Take 1 tablet (180 mg total) by mouth daily. In the morning. 04/09/20   Libby Maw, MD    Family History Family History  Problem Relation Age of Onset   Heart attack Mother    Heart disease Mother    Hypertension Mother    Early death Father        unknown cause   Hypertension Father    Hypertension Sister    Diabetes Brother    Hypertension Brother    Asthma Sister    Hypertension Sister    Hyperlipidemia Brother    Hypertension Brother    Mental illness Brother    Heart disease Sister    Hypertension  Sister    Arthritis Brother    Diabetes Brother    Hypertension Brother    Breast cancer Cousin 49       paternal first cousin   Throat cancer Sister 84       non-smoker   Breast cancer Cousin        dx. in her 96s, paternal first cousin once removed   Kidney cancer Other 95    Social History Social History   Tobacco Use   Smoking status: Never Smoker   Smokeless tobacco: Never Used  Scientific laboratory technician Use: Never used  Substance Use Topics   Alcohol use: Not Currently   Drug use: Never     Allergies   Trazodone and nefazodone and Zoloft [sertraline hcl]   Review of Systems Review of Systems  Constitutional: Positive for chills, fatigue and fever. Negative for activity change and appetite change.  HENT: Positive for congestion, rhinorrhea and sinus pressure. Negative for ear pain, sore throat and trouble swallowing.   Eyes: Negative for discharge and redness.  Respiratory: Positive for cough. Negative for chest tightness and shortness of breath.   Cardiovascular: Negative for chest pain.  Gastrointestinal: Negative for abdominal pain, diarrhea, nausea and vomiting.  Musculoskeletal: Positive for myalgias.  Skin: Negative for rash.  Neurological: Positive for headaches. Negative for dizziness and light-headedness.     Physical Exam Triage Vital Signs ED Triage Vitals  Enc Vitals Group     BP 08/23/20 1800 (!) 153/73     Pulse Rate 08/23/20 1800 86     Resp 08/23/20 1800 18     Temp 08/23/20 1800 100.2 F (37.9 C)     Temp Source 08/23/20 1800 Oral     SpO2 08/23/20 1800 100 %     Weight --      Height --      Head Circumference --      Peak Flow --      Pain Score 08/23/20 1801 8     Pain Loc --      Pain Edu? --      Excl. in [place]? --    No data found.  Updated Vital Signs BP (!) 153/73 (BP Location: Right Arm)    Pulse 86    Temp 100.2 F (37.9 C) (Oral)    Resp 18    SpO2 100%   Visual Acuity Right Eye Distance:   Left Eye  Distance:   Bilateral Distance:    Right Eye Near:   Left Eye Near:    Bilateral Near:     Physical Exam Vitals and nursing note reviewed.  Constitutional:      Appearance: She is  well-developed.     Comments: No acute distress  HENT:     Head: Normocephalic and atraumatic.     Ears:     Comments: Bilateral ears without tenderness to palpation of external auricle, tragus and mastoid, EAC's without erythema or swelling, TM's with good bony landmarks and cone of light. Non erythematous.     Nose: Nose normal.     Mouth/Throat:     Comments: Oral mucosa pink and moist, no tonsillar enlargement or exudate. Posterior pharynx patent and nonerythematous, no uvula deviation or swelling. Normal phonation. Eyes:     Conjunctiva/sclera: Conjunctivae normal.  Cardiovascular:     Rate and Rhythm: Normal rate.  Pulmonary:     Effort: Pulmonary effort is normal. No respiratory distress.     Comments: Breathing comfortably at rest, CTABL, no wheezing, rales or other adventitious sounds auscultated Abdominal:     General: There is no distension.  Musculoskeletal:        General: Normal range of motion.     Cervical back: Neck supple.  Skin:    General: Skin is warm and dry.  Neurological:     Mental Status: She is alert and oriented to person, place, and time.      UC Treatments / Results  Labs (all labs ordered are listed, but only abnormal results are displayed) Labs Reviewed  RESPIRATORY PANEL BY RT PCR (FLU A&B, COVID)    EKG   Radiology No results found.  Procedures Procedures (including critical care time)  Medications Ordered in UC Medications - No data to display  Initial Impression / Assessment and Plan / UC Course  I have reviewed the triage vital signs and the nursing notes.  Pertinent labs & imaging results that were available during my care of the patient were reviewed by me and considered in my medical decision making (see chart for details).      Covid/flu test pending, suspect likely viral URI with cough, recommending symptomatic and supportive care, exam reassuring.  Rest and fluids.  Continue to monitor,Discussed strict return precautions. Patient verbalized understanding and is agreeable with plan.  Final Clinical Impressions(s) / UC Diagnoses   Final diagnoses:  Encounter for screening for COVID-19  Viral URI with cough     Discharge Instructions     Covid/flu test pending, monitor my chart for results Ibuprofen and Tylenol for fevers body aches and headache Rest and fluids Tessalon/benzonatate every 8 hours for cough Flonase nasal spray to help with congestion Follow-up if not improving or worsening    ED Prescriptions    Medication Sig Dispense Auth. Provider   ibuprofen (ADVIL) 600 MG tablet  (Status: Discontinued) Take 1 tablet (600 mg total) by mouth every 6 (six) hours as needed. 30 tablet Yoshito Gaza C, PA-C   benzonatate (TESSALON) 200 MG capsule  (Status: Discontinued) Take 1 capsule (200 mg total) by mouth 3 (three) times daily as needed for up to 7 days for cough. 28 capsule Tysen Roesler C, PA-C   fluticasone (FLONASE) 50 MCG/ACT nasal spray  (Status: Discontinued) Place 1-2 sprays into both nostrils daily for 7 days. 1 g Jireh Vinas C, PA-C   benzonatate (TESSALON) 200 MG capsule Take 1 capsule (200 mg total) by mouth 3 (three) times daily as needed for up to 7 days for cough. 28 capsule Timera Windt C, PA-C   fluticasone (FLONASE) 50 MCG/ACT nasal spray Place 1-2 sprays into both nostrils daily for 7 days. 1 g Montasia Chisenhall, [place] C, PA-C   ibuprofen (  ADVIL) 600 MG tablet Take 1 tablet (600 mg total) by mouth every 6 (six) hours as needed. 30 tablet Dyke Weible, [place] C, PA-C     PDMP not reviewed this encounter.   Janith Lima, [place] 08/23/20 1825

## 2020-08-23 NOTE — ED Triage Notes (Signed)
Pt present coughing, congestion with body aches. Symptom started yesterday. Pt tried otc medication with no relief.

## 2020-08-23 NOTE — Discharge Instructions (Signed)
Covid/flu test pending, monitor my chart for results Ibuprofen and Tylenol for fevers body aches and headache Rest and fluids Tessalon/benzonatate every 8 hours for cough Flonase nasal spray to help with congestion Follow-up if not improving or worsening

## 2020-08-25 ENCOUNTER — Other Ambulatory Visit: Payer: Self-pay | Admitting: Infectious Diseases

## 2020-08-25 DIAGNOSIS — U071 COVID-19: Secondary | ICD-10-CM

## 2020-08-25 DIAGNOSIS — I10 Essential (primary) hypertension: Secondary | ICD-10-CM

## 2020-08-25 DIAGNOSIS — D0512 Intraductal carcinoma in situ of left breast: Secondary | ICD-10-CM

## 2020-08-25 MED ORDER — PROMETHAZINE-DM 6.25-15 MG/5ML PO SYRP
5.0000 mL | ORAL_SOLUTION | Freq: Four times a day (QID) | ORAL | 0 refills | Status: DC | PRN
Start: 2020-08-25 — End: 2020-09-11

## 2020-08-25 NOTE — Progress Notes (Signed)
Called to Discuss with patient about Covid symptoms and the use of the monoclonal antibody infusion for those with mild to moderate Covid symptoms and at a high risk of hospitalization.     Pt appears to qualify for this infusion due to co-morbid conditions and/or a member of an at-risk group in accordance with the FDA Emergency Use Authorization.    She called our hotline back and wanted to schedule appt for treatment after she d/w another APP yesterday.   She is having a bad time with her cough. Benzonate pearls not helping and neither is nyquil.  I sent her in some promethazine-dextromethorphan and advised her to also try some teaspoons of honey. Cautioned about sedating effect from cough syrup.   She will come for treatment in AM.    Victoria Madeira, MSN, NP-C [facility] [email or web address] Pager: [phone] Office: [phone] [place]: [phone]

## 2020-08-25 NOTE — Progress Notes (Signed)
I connected by phone with Victoria Maddox on 08/25/2020 at 12:57 PM to discuss the potential use of a new treatment for mild to moderate COVID-19 viral infection in non-hospitalized patients.  This patient is a 44 y.o. female that meets the FDA criteria for Emergency Use Authorization of COVID monoclonal antibody casirivimab/imdevimab or bamlanivimab/eteseviamb.  Has a (+) direct SARS-CoV-2 viral test result  Has mild or moderate COVID-19   Is NOT hospitalized due to COVID-19  Is within 10 days of symptom onset  Has at least one of the high risk factor(s) for progression to severe COVID-19 and/or hospitalization as defined in EUA.  Specific high risk criteria : Immunosuppressive Disease or Treatment and Cardiovascular disease or hypertension   I have spoken and communicated the following to the patient or parent/caregiver regarding COVID monoclonal antibody treatment:  1. FDA has authorized the emergency use for the treatment of mild to moderate COVID-19 in adults and pediatric patients with positive results of direct SARS-CoV-2 viral testing who are 56 years of age and older weighing at least 40 kg, and who are at high risk for progressing to severe COVID-19 and/or hospitalization.  2. The significant known and potential risks and benefits of COVID monoclonal antibody, and the extent to which such potential risks and benefits are unknown.  3. Information on available alternative treatments and the risks and benefits of those alternatives, including clinical trials.  4. Patients treated with COVID monoclonal antibody should continue to self-isolate and use infection control measures (e.g., wear mask, isolate, social distance, avoid sharing personal items, clean and disinfect "high touch" surfaces, and frequent handwashing) according to CDC guidelines.   5. The patient or parent/caregiver has the option to accept or refuse COVID monoclonal antibody treatment.  After reviewing this  information with the patient, the patient has agreed to receive one of the available covid 19 monoclonal antibodies and will be provided an appropriate fact sheet prior to infusion. Victoria Madeira, NP 08/25/2020 12:57 PM

## 2020-08-26 ENCOUNTER — Telehealth: Payer: Self-pay | Admitting: Adult Health

## 2020-08-26 ENCOUNTER — Ambulatory Visit (HOSPITAL_COMMUNITY)
Admission: RE | Admit: 2020-08-26 | Discharge: 2020-08-26 | Disposition: A | Discharge status: Home / Self Care | Payer: No Typology Code available for payment source | Source: Ambulatory Visit | Attending: Pulmonary Disease | Admitting: Pulmonary Disease

## 2020-08-26 DIAGNOSIS — D0512 Intraductal carcinoma in situ of left breast: Secondary | ICD-10-CM | POA: Diagnosis present

## 2020-08-26 DIAGNOSIS — I10 Essential (primary) hypertension: Secondary | ICD-10-CM | POA: Insufficient documentation

## 2020-08-26 DIAGNOSIS — U071 COVID-19: Secondary | ICD-10-CM | POA: Diagnosis present

## 2020-08-26 MED ORDER — ALBUTEROL SULFATE HFA 108 (90 BASE) MCG/ACT IN AERS: 2.0000 | INHALATION_SPRAY | Freq: Once | RESPIRATORY_TRACT | Status: DC | PRN

## 2020-08-26 MED ORDER — FAMOTIDINE IN NACL 20-0.9 MG/50ML-% IV SOLN: 20.0000 mg | Freq: Once | INTRAVENOUS | Status: DC | PRN

## 2020-08-26 MED ORDER — EPINEPHRINE 0.3 MG/0.3ML IJ SOAJ: 0.3000 mg | Freq: Once | INTRAMUSCULAR | Status: DC | PRN

## 2020-08-26 MED ORDER — DIPHENHYDRAMINE HCL 50 MG/ML IJ SOLN: 50.0000 mg | Freq: Once | INTRAMUSCULAR | Status: DC | PRN

## 2020-08-26 MED ORDER — SODIUM CHLORIDE 0.9 % IV SOLN: INTRAVENOUS | Status: DC | PRN

## 2020-08-26 MED ORDER — SODIUM CHLORIDE 0.9 % IV SOLN: Freq: Once | INTRAVENOUS | Status: AC

## 2020-08-26 MED ORDER — METHYLPREDNISOLONE SODIUM SUCC 125 MG IJ SOLR: 125.0000 mg | Freq: Once | INTRAMUSCULAR | Status: DC | PRN

## 2020-08-26 NOTE — Telephone Encounter (Signed)
Rescheduled per provider. Called and spoke with pt, confirmed 12/1 appt

## 2020-08-26 NOTE — Progress Notes (Signed)
°  Diagnosis: COVID-19 ° °Physician: Dr. Patrick Wright ° °Procedure: Covid Infusion Clinic Med: bamlanivimab\etesevimab infusion - Provided patient with bamlanimivab\etesevimab fact sheet for patients, parents and caregivers prior to infusion. ° °Complications: No immediate complications noted. ° °Discharge: Discharged home  ° °Ally Yow °08/26/2020 ° ° °

## 2020-08-26 NOTE — Discharge Instructions (Signed)

## 2020-08-27 ENCOUNTER — Telehealth: Payer: Self-pay | Admitting: Family Medicine

## 2020-08-27 DIAGNOSIS — R059 Cough, unspecified: Secondary | ICD-10-CM

## 2020-08-27 NOTE — Telephone Encounter (Signed)
Patient calling states that she was recently diagnosed with COVID 3 days ago. She has taken several OTC cough medications that does not seem to be helping. She would like to know if you would send he something else in to help. Appointment offered next available appointment on this coming Monday. Patient not wanting to wait that long or leave the house to go to ED. Please advise.

## 2020-08-27 NOTE — Telephone Encounter (Signed)
Patient called and stated that she was diagnosed with Covid on 10/15 and is experiencing a cough. Patient wanted to see if Dr. Ethelene Hal can send her a prescription to the pharmacy. Pt declined appointment, please advise. CB is [phone]

## 2020-08-28 ENCOUNTER — Inpatient Hospital Stay: Payer: No Typology Code available for payment source | Admitting: Adult Health

## 2020-08-28 MED ORDER — BENZONATATE 200 MG PO CAPS: 200.0000 mg | ORAL_CAPSULE | Freq: Two times a day (BID) | ORAL | 0 refills | Status: DC | PRN

## 2020-08-28 NOTE — Telephone Encounter (Signed)
Spoke with patient who states that she have not experienced any issues with breathing only cough that she can not keep down. Gave doctors recommendations patient states that she had a call coming through and she would call back.

## 2020-08-29 ENCOUNTER — Other Ambulatory Visit: Payer: Self-pay | Admitting: Physician Assistant

## 2020-08-29 ENCOUNTER — Telehealth: Payer: No Typology Code available for payment source | Admitting: Emergency Medicine

## 2020-08-29 DIAGNOSIS — U071 COVID-19: Secondary | ICD-10-CM

## 2020-08-29 MED ORDER — AZITHROMYCIN 250 MG PO TABS: ORAL_TABLET | ORAL | 0 refills | Status: DC

## 2020-08-29 MED ORDER — BENZONATATE 100 MG PO CAPS: 100.0000 mg | ORAL_CAPSULE | Freq: Two times a day (BID) | ORAL | 0 refills | Status: DC | PRN

## 2020-08-29 MED ORDER — ALBUTEROL SULFATE HFA 108 (90 BASE) MCG/ACT IN AERS
1.0000 | INHALATION_SPRAY | Freq: Four times a day (QID) | RESPIRATORY_TRACT | 1 refills | Status: DC | PRN
Start: 2020-08-29 — End: 2020-08-29

## 2020-08-29 MED FILL — BENZONATATE 100 MG CAPS: 100 | 10 days supply | Qty: 20 | Fill #0

## 2020-08-29 MED FILL — ALBUTEROL SULFATE HFA 108 (: 108 (90 BAS | 25 days supply | Qty: 18 | Fill #0

## 2020-08-29 MED FILL — AZITHROMYCIN 250 MG TABLET: 250 | 5 days supply | Qty: 6 | Fill #0

## 2020-08-29 NOTE — Addendum Note (Signed)
Addended by: Erlene Quan on: 08/29/2020 12:51 PM   Modules accepted: Orders

## 2020-08-29 NOTE — Progress Notes (Signed)
E-Visit for Corona Virus Screening  We are sorry you are not feeling well. We are here to help!  You have tested positive for COVID-19, meaning that you were infected with the novel coronavirus and could give the germ to others.    You have been enrolled in [facility] for COVID-19. Daily you will receive a questionnaire within the [facility] website. Our COVID-19 response team will be monitoring your responses daily.  Please continue isolation at home, for at least 10 days since the start of your symptoms and until you have had 24 hours with no fever (without taking a fever reducer) and with improving of symptoms.  Please continue good preventive care measures, including:  frequent hand-washing, avoid touching your face, cover coughs/sneezes, stay out of crowds and keep a 6 foot distance from others.  Follow up with your provider or go to the nearest hospital ED for re-assessment if fever/cough/breathlessness return.  The following symptoms may appear 2-14 days after exposure: . Fever . Cough . Shortness of breath or difficulty breathing . Chills . Repeated shaking with chills . Muscle pain . Headache . Sore throat . New loss of taste or smell . Fatigue . Congestion or runny nose . Nausea or vomiting . Diarrhea  Go to the nearest hospital ED for assessment if fever/cough/breathlessness are severe or illness seems like a threat to life.  It is vitally important that if you feel that you have an infection such as this virus or any other virus that you stay home and away from places where you may spread it to others.  You should avoid contact with people age 100 and older.   You can use medication such as A prescription cough medication called Tessalon Perles 100 mg. You may take 1-2 capsules every 8 hours as needed for cough and A prescription inhaler called Albuterol MDI 90 mcg /actuation 2 puffs every 4 hours as needed for shortness of breath, wheezing, cough   Because this is  a virus, antibiotics are not indicated in this situation.   You may also take acetaminophen (Tylenol) as needed for fever.  Reduce your risk of any infection by using the same precautions used for avoiding the common cold or flu:  Marland Kitchen Wash your hands often with soap and warm water for at least 20 seconds.  If soap and water are not readily available, use an alcohol-based hand sanitizer with at least 60% alcohol.  . If coughing or sneezing, cover your mouth and nose by coughing or sneezing into the elbow areas of your shirt or coat, into a tissue or into your sleeve (not your hands). . Avoid shaking hands with others and consider head nods or verbal greetings only. . Avoid touching your eyes, nose, or mouth with unwashed hands.  . Avoid close contact with people who are sick. . Avoid places or events with large numbers of people in one location, like concerts or sporting events. . Carefully consider travel plans you have or are making. . If you are planning any travel outside or inside the [place], visit the CDC's Travelers' Health webpage for the latest health notices. . If you have some symptoms but not all symptoms, continue to monitor at home and seek medical attention if your symptoms worsen. . If you are having a medical emergency, call 911.  HOME CARE . Only take medications as instructed by your medical team. . Drink plenty of fluids and get plenty of rest. . A steam or ultrasonic humidifier  can help if you have congestion.   GET HELP RIGHT AWAY IF YOU HAVE EMERGENCY WARNING SIGNS** FOR COVID-19. If you or someone is showing any of these signs seek emergency medical care immediately. Call 911 or proceed to your closest emergency facility if: . You develop worsening high fever. . Trouble breathing . Bluish lips or face . Persistent pain or pressure in the chest . New confusion . Inability to wake or stay awake . You cough up blood. . Your symptoms become more severe  **This list is not  all possible symptoms. Contact your medical provider for any symptoms that are sever or concerning to you.  MAKE SURE YOU   Understand these instructions.  Will watch your condition.  Will get help right away if you are not doing well or get worse.  Your e-visit answers were reviewed by a board certified advanced clinical practitioner to complete your personal care plan.  Depending on the condition, your plan could have included both over the counter or prescription medications.  If there is a problem please reply once you have received a response from your provider.  Your safety is important to [place].  If you have drug allergies check your prescription carefully.    You can use MyChart to ask questions about today's visit, request a non-urgent call back, or ask for a work or school excuse for 24 hours related to this e-Visit. If it has been greater than 24 hours you will need to follow up with your provider, or enter a new e-Visit to address those concerns. You will get an e-mail in the next two days asking about your experience.  I hope that your e-visit has been valuable and will speed your recovery. Thank you for using e-visits.   I spent 5-10 minutes reviewing the patient's medical history and chart.

## 2020-09-02 ENCOUNTER — Telehealth: Payer: Self-pay | Admitting: Family Medicine

## 2020-09-02 NOTE — Telephone Encounter (Signed)
Patient is calling and wanted to [facility] from Dr. Ethelene Hal to Dr. Rogers Blocker. Is this ok? CB is [phone]

## 2020-09-02 NOTE — Telephone Encounter (Signed)
Okay with me 

## 2020-09-04 NOTE — Telephone Encounter (Signed)
Fine with me, thanks for asking,  Dr. Rogers Blocker

## 2020-09-04 NOTE — Telephone Encounter (Signed)
LVM to let patient know that request for the transfer has been approved and to contact that office for scheduling.

## 2020-09-11 ENCOUNTER — Encounter: Payer: Self-pay | Admitting: Nurse Practitioner

## 2020-09-11 ENCOUNTER — Other Ambulatory Visit: Payer: Self-pay | Admitting: Nurse Practitioner

## 2020-09-11 ENCOUNTER — Telehealth (INDEPENDENT_AMBULATORY_CARE_PROVIDER_SITE_OTHER): Payer: No Typology Code available for payment source | Admitting: Nurse Practitioner

## 2020-09-11 VITALS — Ht 63.0 in | Wt 135.0 lb

## 2020-09-11 DIAGNOSIS — J9801 Acute bronchospasm: Secondary | ICD-10-CM

## 2020-09-11 DIAGNOSIS — U071 COVID-19: Secondary | ICD-10-CM

## 2020-09-11 DIAGNOSIS — B349 Viral infection, unspecified: Secondary | ICD-10-CM

## 2020-09-11 MED ORDER — METHYLPREDNISOLONE 4 MG PO TBPK: ORAL_TABLET | ORAL | 0 refills | Status: DC

## 2020-09-11 MED ORDER — HYDROCODONE-HOMATROPINE 5-1.5 MG/5ML PO SYRP: 5.0000 mL | ORAL_SOLUTION | Freq: Four times a day (QID) | ORAL | 0 refills | Status: DC | PRN

## 2020-09-11 MED FILL — HYDROCODONE-HOMATROPINE SYR: 5-1.5 | 6 days supply | Qty: 120 | Fill #0

## 2020-09-11 MED FILL — METHYLPREDNISOLONE 4 MG TBP: 4 | 6 days supply | Qty: 21 | Fill #0

## 2020-09-11 NOTE — Progress Notes (Signed)
Virtual Visit via Video Note  I connected with@ on 09/11/20 at  9:30 AM EDT by a video enabled telemedicine application and verified that I am speaking with the correct person using two identifiers.  Location: Patient:Home Provider: Office Participants: patient and provider  I discussed the limitations of evaluation and management by telemedicine and the availability of in person appointments. I also discussed with the patient that there may be a patient responsible charge related to this service. The patient expressed understanding and agreed to proceed.  CC:non productive cough x 2weeks, positive COVID 08/24/2020  History of Present Illness: Cough This is a new problem. The current episode started 1 to 4 weeks ago. The problem has been unchanged. The problem occurs constantly. The cough is non-productive. Associated symptoms include nasal congestion and postnasal drip. Pertinent negatives include no chest pain, chills, fever, hemoptysis, myalgias, rhinorrhea, shortness of breath, sweats or wheezing. The symptoms are aggravated by lying down. She has tried a beta-agonist inhaler, prescription cough suppressant, OTC cough suppressant and rest for the symptoms. The treatment provided mild relief. Her past medical history is significant for bronchitis.  monoclonal antibody infusion administered 08/26/2020 Completed Aztithromycin 09/03/2020. No improvement and Increase sedation with promethazine-DM and benzonatate. unable to provide pulse, O2 sat, and BP  Observations/Objective: Physical Exam Constitutional:      General: She is not in acute distress. Pulmonary:     Effort: Pulmonary effort is normal.  Neurological:     Mental Status: She is alert and oriented to person, place, and time.    Assessment and Plan: Victoria Maddox was seen today for acute visit.  Diagnoses and all orders for this visit:  Acute bronchospasm due to viral infection -     methylPREDNISolone (MEDROL DOSEPAK) 4 MG  TBPK tablet; Take as directed on package -     HYDROcodone-homatropine (HYCODAN) 5-1.5 MG/5ML syrup; Take 5 mLs by mouth every 6 (six) hours as needed.  COVID-19 virus infection   Follow Up Instructions: Maintain adequate oral hydration Go to urgent care clinic if symptoms worsen.   I discussed the assessment and treatment plan with the patient. The patient was provided an opportunity to ask questions and all were answered. The patient agreed with the plan and demonstrated an understanding of the instructions.   The patient was advised to call back or seek an in-person evaluation if the symptoms worsen or if the condition fails to improve as anticipated.  Wilfred Lacy, NP

## 2020-09-11 NOTE — Patient Instructions (Signed)
10 Things You Can Do to Manage Your COVID-19 Symptoms at Home If you have possible or confirmed COVID-19: 1. Stay home from work and school. And stay away from other public places. If you must go out, avoid using any kind of public transportation, ridesharing, or taxis. 2. Monitor your symptoms carefully. If your symptoms get worse, call your healthcare provider immediately. 3. Get rest and stay hydrated. 4. If you have a medical appointment, call the healthcare provider ahead of time and tell them that you have or may have COVID-19. 5. For medical emergencies, call 911 and notify the dispatch personnel that you have or may have COVID-19. 6. Cover your cough and sneezes with a tissue or use the inside of your elbow. 7. Wash your hands often with soap and water for at least 20 seconds or clean your hands with an alcohol-based hand sanitizer that contains at least 60% alcohol. 8. As much as possible, stay in a specific room and away from other people in your home. Also, you should use a separate bathroom, if available. If you need to be around other people in or outside of the home, wear a mask. 9. Avoid sharing personal items with other people in your household, like dishes, towels, and bedding. 10. Clean all surfaces that are touched often, like counters, tabletops, and doorknobs. Use household cleaning sprays or wipes according to the label instructions. cdc.gov/coronavirus 05/10/2019 This information is not intended to replace advice given to you by your health care provider. Make sure you discuss any questions you have with your health care provider. Document Revised: 10/12/2019 Document Reviewed: 10/12/2019 Elsevier Patient Education  2020 Elsevier Inc.  

## 2020-10-09 ENCOUNTER — Ambulatory Visit: Payer: No Typology Code available for payment source | Admitting: Family Medicine

## 2020-10-09 ENCOUNTER — Encounter: Payer: Self-pay | Admitting: Adult Health

## 2020-10-09 ENCOUNTER — Inpatient Hospital Stay: Payer: No Typology Code available for payment source | Attending: Adult Health | Admitting: Adult Health

## 2020-10-09 ENCOUNTER — Telehealth: Payer: Self-pay | Admitting: Adult Health

## 2020-10-09 ENCOUNTER — Other Ambulatory Visit: Payer: Self-pay

## 2020-10-09 VITALS — BP 151/76 | HR 71 | Temp 99.1°F | Resp 18 | Ht 63.0 in | Wt 142.7 lb

## 2020-10-09 DIAGNOSIS — Z170 Estrogen receptor positive status [ER+]: Secondary | ICD-10-CM | POA: Insufficient documentation

## 2020-10-09 DIAGNOSIS — Z923 Personal history of irradiation: Secondary | ICD-10-CM | POA: Diagnosis not present

## 2020-10-09 DIAGNOSIS — Z79810 Long term (current) use of selective estrogen receptor modulators (SERMs): Secondary | ICD-10-CM | POA: Diagnosis not present

## 2020-10-09 DIAGNOSIS — R059 Cough, unspecified: Secondary | ICD-10-CM | POA: Diagnosis not present

## 2020-10-09 DIAGNOSIS — Z7952 Long term (current) use of systemic steroids: Secondary | ICD-10-CM | POA: Insufficient documentation

## 2020-10-09 DIAGNOSIS — Z8349 Family history of other endocrine, nutritional and metabolic diseases: Secondary | ICD-10-CM | POA: Insufficient documentation

## 2020-10-09 DIAGNOSIS — Z833 Family history of diabetes mellitus: Secondary | ICD-10-CM | POA: Diagnosis not present

## 2020-10-09 DIAGNOSIS — Z8249 Family history of ischemic heart disease and other diseases of the circulatory system: Secondary | ICD-10-CM | POA: Insufficient documentation

## 2020-10-09 DIAGNOSIS — Z8051 Family history of malignant neoplasm of kidney: Secondary | ICD-10-CM | POA: Insufficient documentation

## 2020-10-09 DIAGNOSIS — U099 Post covid-19 condition, unspecified: Secondary | ICD-10-CM | POA: Insufficient documentation

## 2020-10-09 DIAGNOSIS — Z8261 Family history of arthritis: Secondary | ICD-10-CM | POA: Diagnosis not present

## 2020-10-09 DIAGNOSIS — D0512 Intraductal carcinoma in situ of left breast: Secondary | ICD-10-CM | POA: Insufficient documentation

## 2020-10-09 DIAGNOSIS — Z79899 Other long term (current) drug therapy: Secondary | ICD-10-CM | POA: Diagnosis not present

## 2020-10-09 DIAGNOSIS — Z803 Family history of malignant neoplasm of breast: Secondary | ICD-10-CM | POA: Insufficient documentation

## 2020-10-09 NOTE — Progress Notes (Signed)
SURVIVORSHIP  VISIT:    BRIEF ONCOLOGIC HISTORY:  Oncology History  Ductal carcinoma in situ (DCIS) of left breast  02/08/2020 Initial Diagnosis   Screening mammogram detected left breast calcifications, 2.9cm. Biopsy showed DCIS with calcifications, low grade, ER+ 70%, PR+ 90%.   02/20/2020 Genetic Testing   Negative genetic testing:  No pathogenic variants detected on the [facility] Breast Cancer STAT or Multi-Cancer Panels. The report date is 02/20/2020.  The Breast Cancer STAT Panel offered by [facility] includes sequencing and deletion/duplication analysis for the following 9 genes:  ATM, BRCA1, BRCA2, CDH1, CHEK2, PALB2, PTEN, STK11 and TP53.  The Multi-Cancer Panel offered by [facility] includes sequencing and/or deletion duplication testing of the following 85 genes: AIP, ALK, APC, ATM, AXIN2,BAP1,  BARD1, BLM, BMPR1A, BRCA1, BRCA2, BRIP1, CASR, CDC73, CDH1, CDK4, CDKN1B, CDKN1C, CDKN2A (p14ARF), CDKN2A (p16INK4a), CEBPA, CHEK2, CTNNA1, DICER1, DIS3L2, EGFR (c.2369C>T, p.Thr790Met variant only), EPCAM (Deletion/duplication testing only), FH, FLCN, GATA2, GPC3, GREM1 (Promoter region deletion/duplication testing only), HOXB13 (c.251G>A, p.Gly84Glu), HRAS, KIT, MAX, MEN1, MET, MITF (c.952G>A, p.Glu318Lys variant only), MLH1, MSH2, MSH3, MSH6, MUTYH, NBN, NF1, NF2, NTHL1, PALB2, PDGFRA, PHOX2B, PMS2, POLD1, POLE, POT1, PRKAR1A, PTCH1, PTEN, RAD50, RAD51C, RAD51D, RB1, RECQL4, RET, RNF43, RUNX1, SDHAF2, SDHA (sequence changes only), SDHB, SDHC, SDHD, SMAD4, SMARCA4, SMARCB1, SMARCE1, STK11, SUFU, TERC, TERT, TMEM127, TP53, TSC1, TSC2, VHL, WRN and WT1.   03/12/2020 Surgery   Left lumpectomy Donne Hazel) [phone]): flat epithelial atypia with calcifications. Negative margins. No regional lymph nodes were examined. ER/PR +.   04/23/2020 - 06/07/2020 Radiation Therapy   The patient initially received a dose of 50.4 Gy in 28 fractions to the breast using whole-breast tangent fields. This was delivered using a  3-D conformal technique. The pt received a boost delivering an additional 10 Gy in 5 fractions using a electron boost with 28mV electrons. The total dose was 60.4 Gy.   06/2020 - 06/2025 Anti-estrogen oral therapy   Tamoxifen     INTERVAL HISTORY:  Ms. MHilarioto review her survivorship care plan detailing her treatment course for breast cancer, as well as monitoring long-term side effects of that treatment, education regarding health maintenance, screening, and overall wellness and health promotion.     Overall, Ms. Puchalski reports feeling quite well  REVIEW OF SYSTEMS:  Review of Systems  Constitutional: Negative for appetite change, chills, fatigue, fever and unexpected weight change.  HENT:   Negative for hearing loss, lump/mass and trouble swallowing.   Eyes: Negative for eye problems and icterus.  Respiratory: Negative for chest tightness, cough and shortness of breath.   Cardiovascular: Negative for chest pain, leg swelling and palpitations.  Gastrointestinal: Negative for abdominal distention, abdominal pain, constipation, diarrhea, nausea and vomiting.  Endocrine: Negative for hot flashes.  Genitourinary: Negative for difficulty urinating.   Musculoskeletal: Negative for arthralgias.  Skin: Negative for itching and rash.  Neurological: Negative for dizziness, extremity weakness, headaches and numbness.  Hematological: Negative for adenopathy. Does not bruise/bleed easily.  Psychiatric/Behavioral: Negative for depression. The patient is not nervous/anxious.   Breast: Denies any new nodularity, masses, tenderness, nipple changes, or nipple discharge.      ONCOLOGY TREATMENT TEAM:  1. Surgeon:  Dr. WDonne [facility] 2. Medical Oncologist: Dr. GLindi Adie 3. Radiation Oncologist: Dr. KSondra Come   PAST MEDICAL/SURGICAL HISTORY:  Past Medical History:  Diagnosis Date  . Allergy   . Depression   . Family history of breast cancer   . Family history of kidney  cancer   . Family  history of throat cancer   . Frequent headaches   . Heart murmur   . History of recurrent UTIs   . Hypertension   . Migraines   . Urinary incontinence    Past Surgical History:  Procedure Laterality Date  . BREAST LUMPECTOMY WITH RADIOACTIVE SEED LOCALIZATION Left 03/12/2020   Procedure: LEFT BREAST LUMPECTOMY WITH BRACKETED RADIOACTIVE SEED LOCALIZATION;  Surgeon: Rolm Bookbinder, MD;  Location: [place];  Service: General;  Laterality: Left;  . DILATION AND CURETTAGE OF UTERUS       ALLERGIES:  Allergies  Allergen Reactions  . Trazodone And Nefazodone     Dizziness  . Zoloft [Sertraline Hcl]     Anxiousness, lash out at kids     CURRENT MEDICATIONS:  Outpatient Encounter Medications as of 10/09/2020  Medication Sig Note  . acetaminophen (TYLENOL) 325 MG tablet Take 650 mg by mouth every 6 (six) hours as needed.   Marland Kitchen albuterol (VENTOLIN HFA) 108 (90 Base) MCG/ACT inhaler Inhale 1-2 puffs into the lungs every 6 (six) hours as needed for wheezing or shortness of breath.   . benzonatate (TESSALON) 100 MG capsule Take 1 capsule (100 mg total) by mouth 2 (two) times daily as needed for cough.   . diphenhydrAMINE (BENADRYL) 25 mg capsule Take 25 mg by mouth at bedtime as needed.   . hydrochlorothiazide (HYDRODIURIL) 25 MG tablet TAKE 1 TABLET (25 MG TOTAL) BY MOUTH DAILY. 03/05/2020: No Longer taking  . HYDROcodone-homatropine (HYCODAN) 5-1.5 MG/5ML syrup Take 5 mLs by mouth every 6 (six) hours as needed.   Marland Kitchen ibuprofen (ADVIL) 600 MG tablet Take 1 tablet (600 mg total) by mouth every 6 (six) hours as needed.   . methylPREDNISolone (MEDROL DOSEPAK) 4 MG TBPK tablet Take as directed on package   . ondansetron (ZOFRAN) 4 MG tablet Take 1 tablet (4 mg total) by mouth every 8 (eight) hours as needed for nausea or vomiting.   . tamoxifen (NOLVADEX) 20 MG tablet Take 1 tablet (20 mg total) by mouth daily.   . verapamil (CALAN SR) 180 MG CR tablet Take 1  tablet (180 mg total) by mouth daily. In the morning.   . fluticasone (FLONASE) 50 MCG/ACT nasal spray Place 1-2 sprays into both nostrils daily for 7 days.    No facility-administered encounter medications on file as of 10/09/2020.     ONCOLOGIC FAMILY HISTORY:  Family History  Problem Relation Age of Onset  . Heart attack Mother   . Heart disease Mother   . Hypertension Mother   . Early death Father        unknown cause  . Hypertension Father   . Hypertension Sister   . Diabetes Brother   . Hypertension Brother   . Asthma Sister   . Hypertension Sister   . Hyperlipidemia Brother   . Hypertension Brother   . Mental illness Brother   . Heart disease Sister   . Hypertension Sister   . Arthritis Brother   . Diabetes Brother   . Hypertension Brother   . Breast cancer Cousin 46       paternal first cousin  . Throat cancer Sister 44       non-smoker  . Breast cancer Cousin        dx. in her 49s, paternal first cousin once removed  . Kidney cancer Other 5     GENETIC COUNSELING/TESTING: See above  SOCIAL HISTORY:  Social History   Socioeconomic History  . Marital status: Married  Spouse name: Not on file  . Number of children: Not on file  . Years of education: Not on file  . Highest education level: Not on file  Occupational History  . Not on file  Tobacco Use  . Smoking status: Never Smoker  . Smokeless tobacco: Never Used  Vaping Use  . Vaping Use: Never used  Substance and Sexual Activity  . Alcohol use: Not Currently  . Drug use: Never  . Sexual activity: Yes    Partners: Male  Other Topics Concern  . Not on file  Social History Narrative  . Not on file   Social Determinants of Health   Financial Resource Strain:   . Difficulty of Paying Living Expenses: Not on file  Food Insecurity:   . Worried About Charity fundraiser in the Last Year: Not on file  . Ran Out of Food in the Last Year: Not on file  Transportation Needs:   . Lack of  Transportation (Medical): Not on file  . Lack of Transportation (Non-Medical): Not on file  Physical Activity:   . Days of Exercise per Week: Not on file  . Minutes of Exercise per Session: Not on file  Stress:   . Feeling of Stress : Not on file  Social Connections:   . Frequency of Communication with Friends and Family: Not on file  . Frequency of Social Gatherings with Friends and Family: Not on file  . Attends Religious Services: Not on file  . Active Member of Clubs or Organizations: Not on file  . Attends Archivist Meetings: Not on file  . Marital Status: Not on file  Intimate Partner Violence:   . Fear of Current or Ex-Partner: Not on file  . Emotionally Abused: Not on file  . Physically Abused: Not on file  . Sexually Abused: Not on file     OBSERVATIONS/OBJECTIVE:  BP (!) 151/76 (BP Location: Left Arm, Patient Position: Sitting)   Pulse 71   Temp 99.1 F (37.3 C) (Tympanic)   Resp 18   Ht 5' 3"  (1.6 m)   Wt 142 lb 11.2 oz (64.7 kg)   SpO2 100%   BMI 25.28 kg/m  GENERAL: Patient is a well appearing female in no acute distress HEENT:  Sclerae anicteric.  Oropharynx clear and moist. No ulcerations or evidence of oropharyngeal candidiasis. Neck is supple.  NODES:  No cervical, supraclavicular, or axillary lymphadenopathy palpated.  BREAST EXAM:  Left breast s/p lumpectomy and radiation no sign of local recurrence, right breast benign LUNGS:  Clear to auscultation bilaterally.  No wheezes or rhonchi. HEART:  Regular rate and rhythm. No murmur appreciated. ABDOMEN:  Soft, nontender.  Positive, normoactive bowel sounds. No organomegaly palpated. MSK:  No focal spinal tenderness to palpation. Full range of motion bilaterally in the upper extremities. EXTREMITIES:  No peripheral edema.   SKIN:  Clear with no obvious rashes or skin changes. No nail dyscrasia. NEURO:  Nonfocal. Well oriented.  Appropriate affect.   LABORATORY DATA:  None for this  visit.  DIAGNOSTIC IMAGING:  None for this visit.      ASSESSMENT AND PLAN:  Ms.. Victoria Maddox is a pleasant 44 y.o. female with Stage 0 left breast DCIS, ER+/PR+, diagnosed in 02/2020, treated with lumpectomy, adjuvant radiation therapy, and anti-estrogen therapy with Tamoxifen beginning in 06/2020.  She presents to the Survivorship Clinic for our initial meeting and routine follow-up post-completion of treatment for breast cancer.    1. Stage 0 left breast cancer:  Ms. Loth is continuing to recover from definitive treatment for breast cancer. She will follow-up with her medical oncologist, Dr. Lindi Adie in 6 months with history and physical exam per surveillance protocol.  She will continue her anti-estrogen therapy with Tamoxifen. Thus far, she is tolerating the Tamoxifen well, with minimal side effects. She was instructed to make Dr. Lindi Adie or myself aware if she begins to experience any worsening side effects of the medication and I could see her back in clinic to help manage those side effects, as needed. Her mammogram is due 01/2021; orders placed today.   Today, a comprehensive survivorship care plan and treatment summary was reviewed with the patient today detailing her breast cancer diagnosis, treatment course, potential late/long-term effects of treatment, appropriate follow-up care with recommendations for the future, and patient education resources.  A copy of this summary, along with a letter will be sent to the patient's primary care provider via mail/fax/In Basket message after today's visit.    2.  Persistent cough since COVID diagnosis. I recommended she f/u with her PCP and perhaps ask for referral to post covid clinic for help.   Her lungs were clear on exam  3.  Hair thinning/hot flashes: secondary to tamoxifen.  I recommended that she change the time of day that she is taking tamoxifen and this can help with the hot flashes.    4. Fatigue:  This is likely multi factorial, secondary to  post covid fatigue and having undergone breast cancer treatment.    5. + distress screen: Gave patient handouts on social work and QOL-CSV to complete.  6. Bone health: She was given education on specific activities to promote bone health.  7. Cancer screening:  Due to Ms. Dauenhauer's history and her age, she should receive screening for skin cancers, colon cancer, and gynecologic cancers.  The information and recommendations are listed on the patient's comprehensive care plan/treatment summary and were reviewed in detail with the patient.    8. Health maintenance and wellness promotion: Ms. Staheli was encouraged to consume 5-7 servings of fruits and vegetables per day. We reviewed the "Nutrition Rainbow" handout, as well as the handout "Take Control of Your Health and Reduce Your Cancer Risk" from the [employer].  She was also encouraged to engage in moderate to vigorous exercise for 30 minutes per day most days of the week. We discussed the [employer] fitness program, which is designed for cancer survivors to help them become more physically fit after cancer treatments.  She was instructed to limit her alcohol consumption and continue to abstain from tobacco use.     9. Support services/counseling: It is not uncommon for this period of the patient's cancer care trajectory to be one of many emotions and stressors.  We discussed how this can be increasingly difficult during the times of quarantine and social distancing due to the COVID-19 pandemic.   She was given information regarding our available services and encouraged to contact me with any questions or for help enrolling in any of our support group/programs.    Follow up instructions:    -Return to cancer center 6 months for f/u with Dr. Lindi Adie  -Mammogram due in 01/2021 -Follow up with surgery 1 year -She is welcome to return back to the Survivorship Clinic at any time; no additional follow-up needed at this time.  -Consider  referral back to survivorship as a long-term survivor for continued surveillance  The patient was provided an opportunity to ask questions and  all were answered. The patient agreed with the plan and demonstrated an understanding of the instructions.   Total encounter time: 45 minutes*  Wilber Bihari, NP 10/09/20 10:10 AM Medical Oncology and Hematology [facility] [place], [place] [address] Tel. [phone]    Fax. [phone]  *Total Encounter Time as defined by the Centers for Medicare and Medicaid Services includes, in addition to the face-to-face time of a patient visit (documented in the note above) non-face-to-face time: obtaining and reviewing outside history, ordering and reviewing medications, tests or procedures, care coordination (communications with other health care professionals or caregivers) and documentation in the medical record.

## 2020-10-09 NOTE — Telephone Encounter (Signed)
Scheduled appts per 12/1 los. Pt declined print out of AVS.

## 2020-10-14 ENCOUNTER — Telehealth: Payer: Self-pay | Admitting: Family Medicine

## 2020-10-14 DIAGNOSIS — R059 Cough, unspecified: Secondary | ICD-10-CM

## 2020-10-14 DIAGNOSIS — U071 COVID-19: Secondary | ICD-10-CM

## 2020-10-14 MED FILL — VERAPAMIL HCL ER 180 MG TBC: 180 | 90 days supply | Qty: 90 | Fill #1

## 2020-10-14 MED FILL — TAMOXIFEN 20 MG TABLET: 20 | 90 days supply | Qty: 90 | Fill #1

## 2020-10-14 NOTE — Telephone Encounter (Signed)
Patient is calling to see if she can get a referral to Respiratory. She states that she's still coughing at night. Please call her at [phone] if you have any questions.

## 2020-10-14 NOTE — Telephone Encounter (Signed)
Please advise message below patient had video visit on 09/11/20 for symptoms.

## 2020-10-14 NOTE — Telephone Encounter (Signed)
Schedule appt for CXR Also entered referral to pulmonologist

## 2020-10-16 NOTE — Telephone Encounter (Signed)
Called patient to inform of message below, no answer unable to leave message will call back.  °

## 2020-10-17 NOTE — Telephone Encounter (Signed)
Would you schedule patient to come in for Chest X-ray? I tried but the schedule for xray was not coming up for me. Patient said she would call the office.

## 2020-11-14 ENCOUNTER — Ambulatory Visit: Payer: No Typology Code available for payment source | Admitting: Family Medicine

## 2020-11-20 ENCOUNTER — Encounter: Payer: Self-pay | Admitting: Pulmonary Disease

## 2020-11-20 ENCOUNTER — Ambulatory Visit (INDEPENDENT_AMBULATORY_CARE_PROVIDER_SITE_OTHER): Payer: No Typology Code available for payment source

## 2020-11-20 ENCOUNTER — Other Ambulatory Visit: Payer: Self-pay | Admitting: Pulmonary Disease

## 2020-11-20 ENCOUNTER — Other Ambulatory Visit: Payer: Self-pay

## 2020-11-20 ENCOUNTER — Ambulatory Visit (INDEPENDENT_AMBULATORY_CARE_PROVIDER_SITE_OTHER): Payer: No Typology Code available for payment source | Admitting: Pulmonary Disease

## 2020-11-20 VITALS — BP 124/72 | HR 69 | Temp 98.4°F | Ht 63.0 in | Wt 139.0 lb

## 2020-11-20 DIAGNOSIS — R059 Cough, unspecified: Secondary | ICD-10-CM

## 2020-11-20 DIAGNOSIS — J4520 Mild intermittent asthma, uncomplicated: Secondary | ICD-10-CM | POA: Diagnosis not present

## 2020-11-20 IMAGING — DX DG CHEST 2V
2 series · 2 of 2 positions shown · non-contrast
Comparison: None.

CLINICAL DATA: Cough.

EXAM:
CHEST - 2 VIEW

[chest pa]
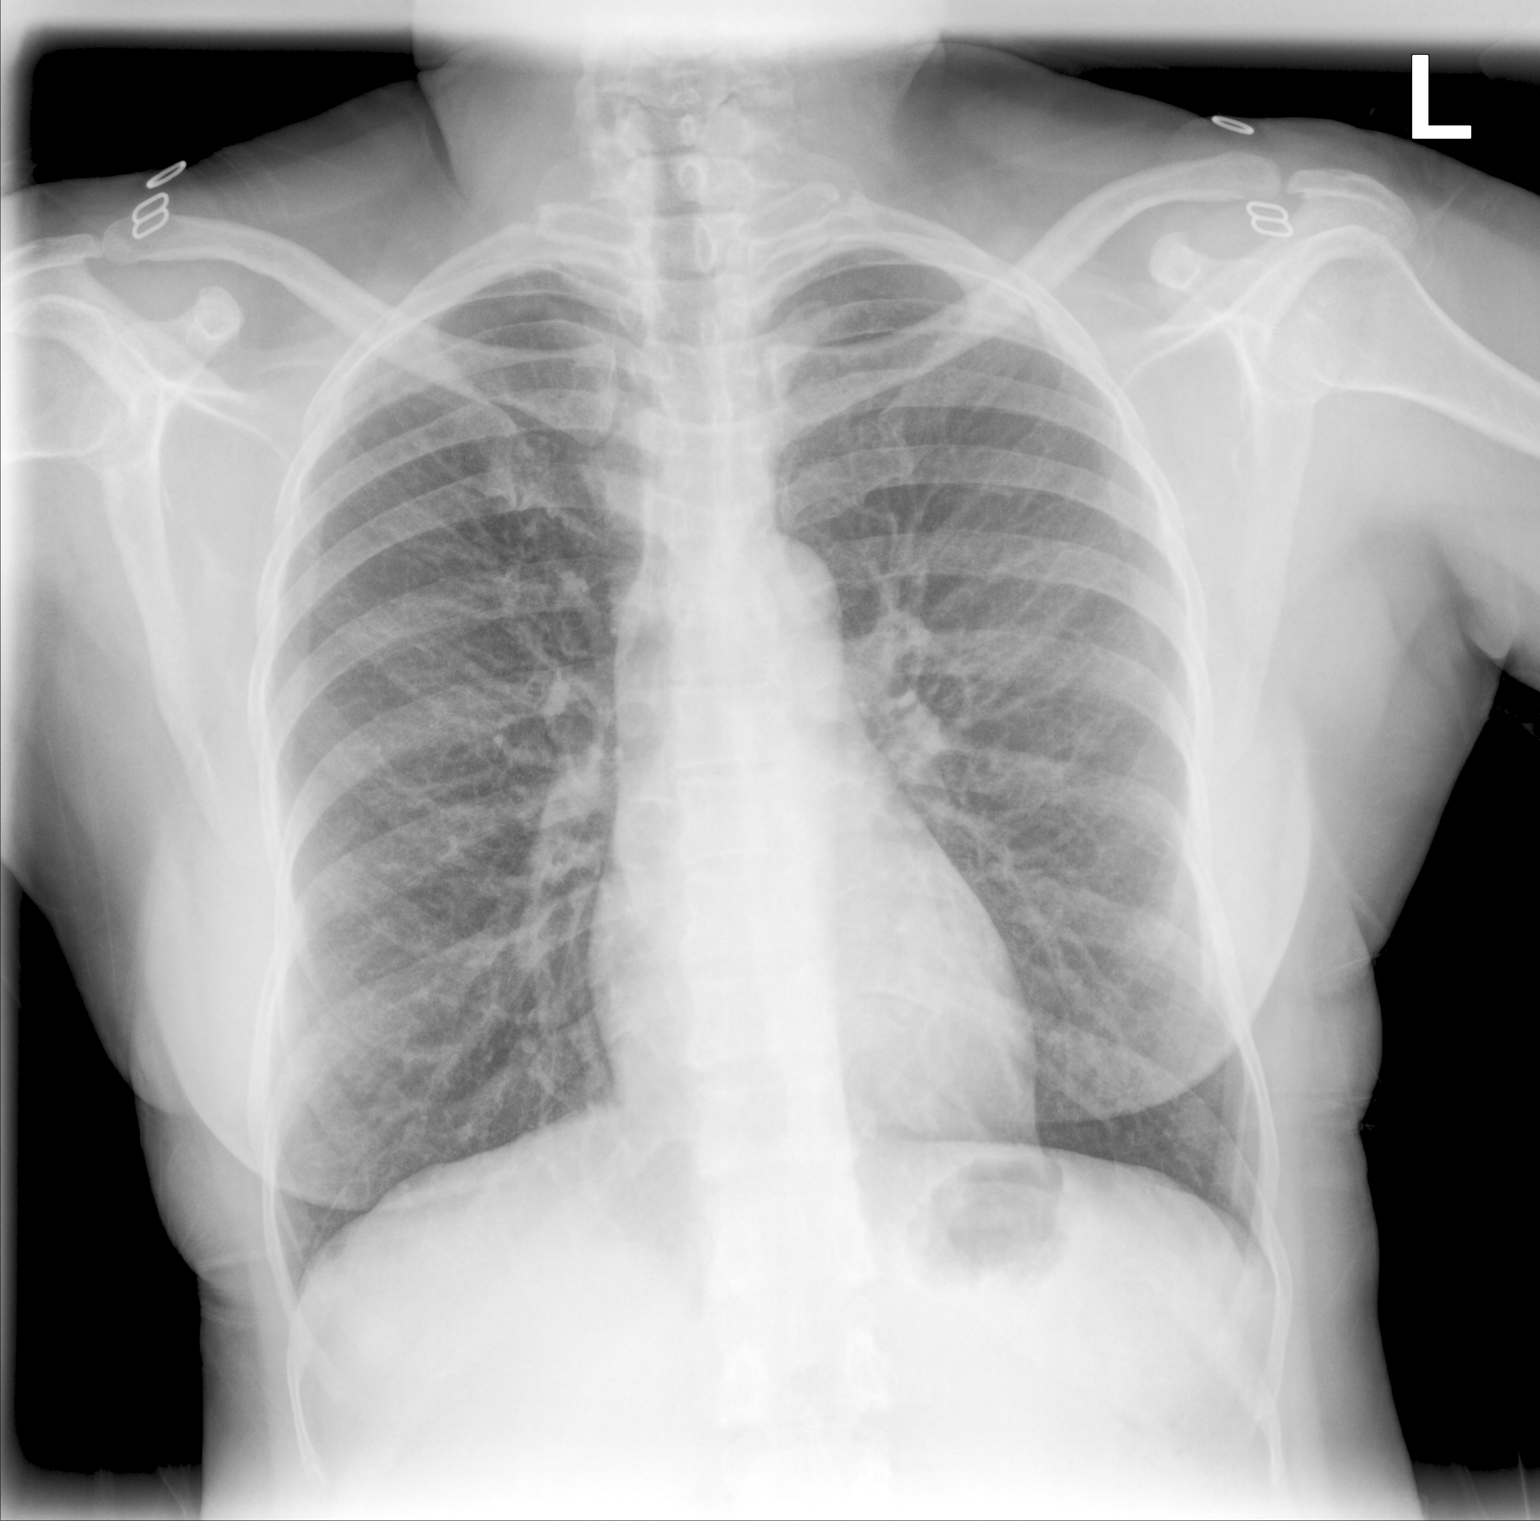

[chest lat]
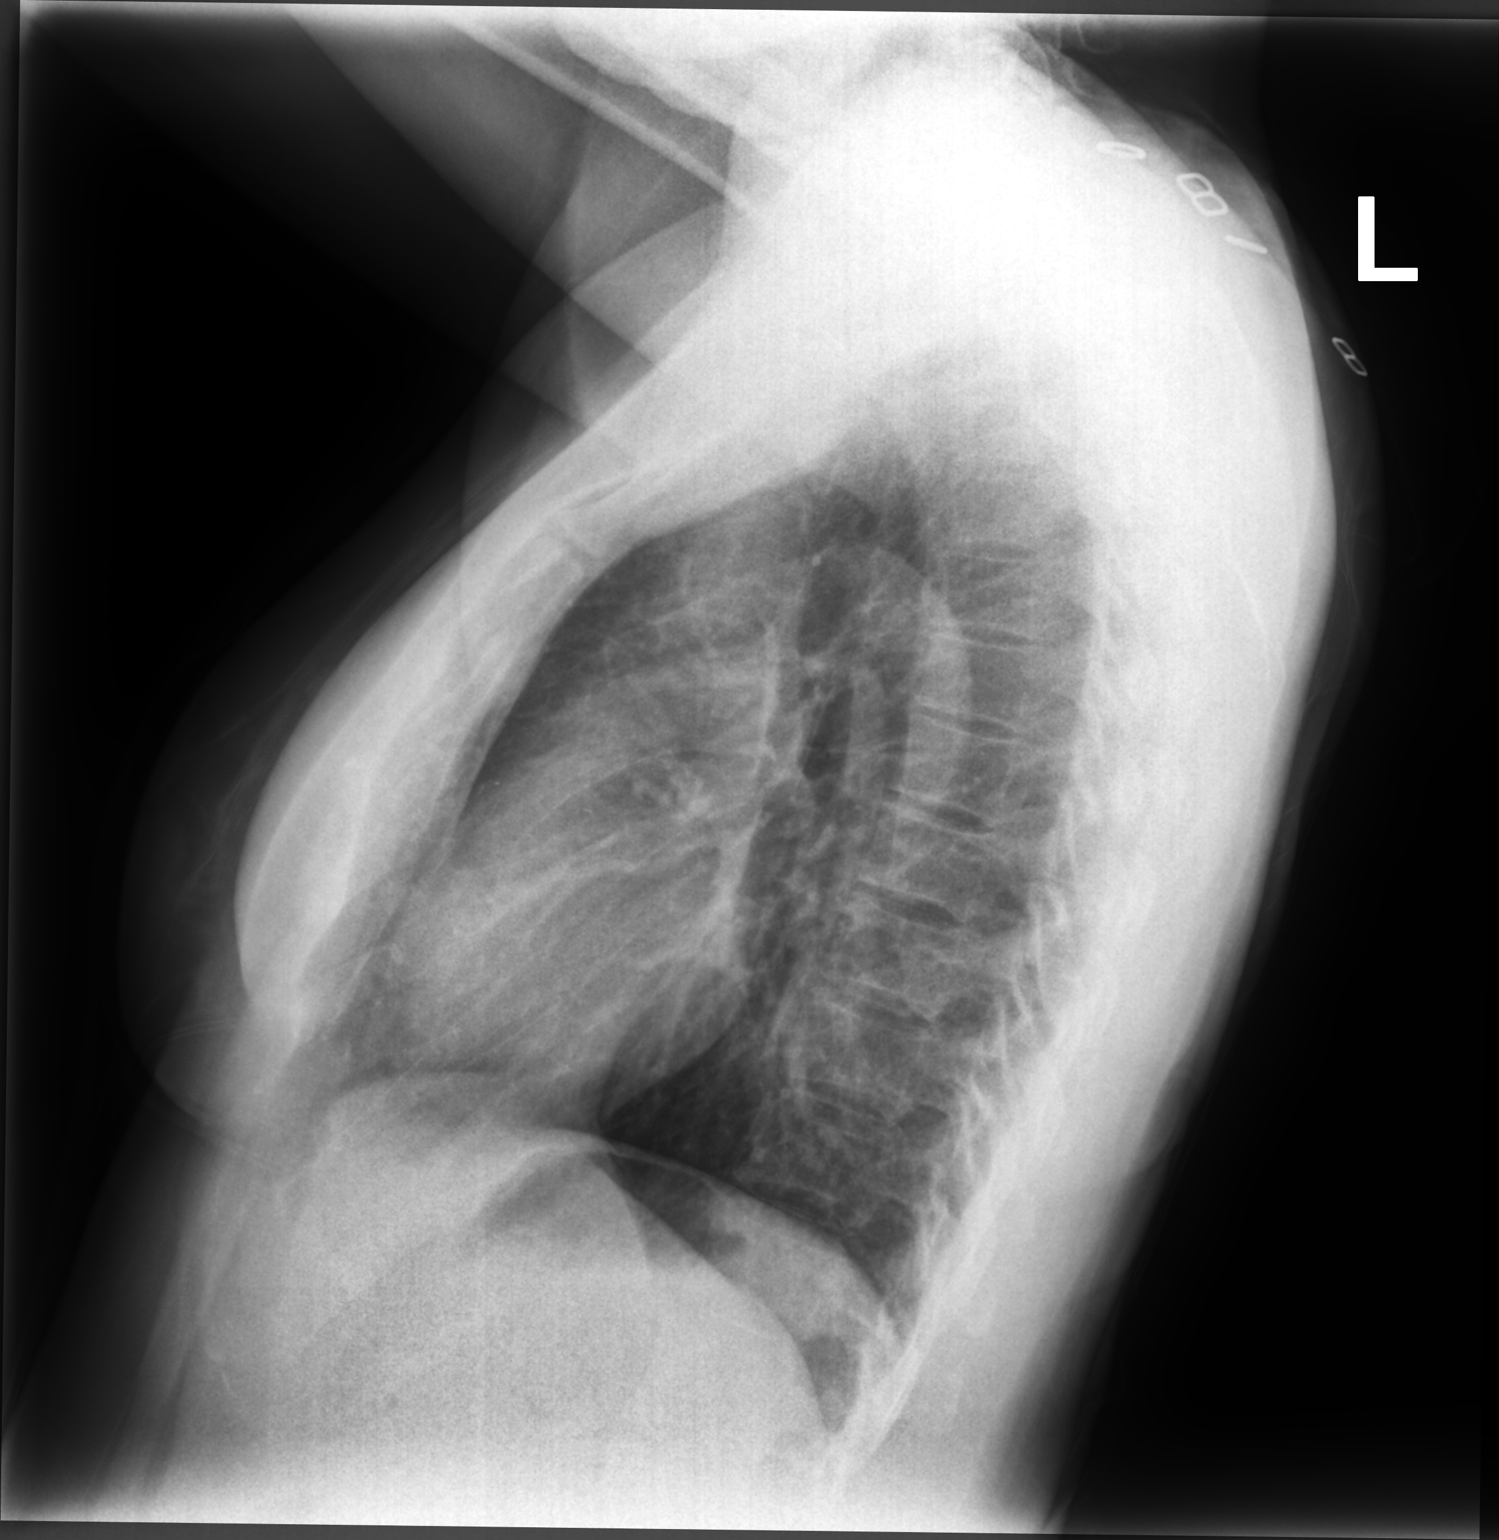

[2 of 2 positions shown; findings below may reference images not displayed]

FINDINGS: The heart size and mediastinal contours are within normal limits.
Both lungs are clear. The visualized skeletal structures are
unremarkable.
IMPRESSION: No active cardiopulmonary disease.

## 2020-11-20 MED ORDER — PANTOPRAZOLE SODIUM 40 MG PO TBEC: 40.0000 mg | DELAYED_RELEASE_TABLET | Freq: Every day | ORAL | 6 refills | Status: DC

## 2020-11-20 MED ORDER — FLOVENT HFA 110 MCG/ACT IN AERO: 2.0000 | INHALATION_SPRAY | Freq: Two times a day (BID) | RESPIRATORY_TRACT | 12 refills | Status: DC

## 2020-11-20 MED FILL — FLOVENT HFA 110 MCG INHALER: 110 | 30 days supply | Qty: 12 | Fill #0

## 2020-11-20 MED FILL — PANTOPRAZOLE SOD DR 40 MG T: 40 | 30 days supply | Qty: 30 | Fill #0

## 2020-11-20 NOTE — Progress Notes (Signed)
Synopsis: Referred in 11/2020  for cough by Flossie Buffy, NP  Subjective:   PATIENT ID: Victoria Maddox GENDER: female DOB: [birth year], MRN: [id number]   HPI  Chief Complaint  Patient presents with  . Consult    Referred by PCP for dry cough for the past 3 months. Was diagnosed with COVID in October 2021. Has had the cough ever since. Did have some SOB and wheezing with cough but these have gone away.    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   She had covid 19 in October 2021 which is when her cough started. At the time of infection she had wheezing and shortness of breath which have subsided but the cough remains.   She does report other episodes of of prolonged cough starting in 2016 prior to her covid infection. These episodes can last for 1-2 months and are usually in the Winter and Spring seasons. She reports the cold air can affect her cough and also the Spring pollens. Her cough can be aggravated by strong perfumes/colognes or certain cleaning agents like bleach. She has an albuterol inhaler that she has been prescribed after her covid infection which she noticed some relief. She has not been using it recently for her cough. She denies sinus congestion or runny nose. She denies post nasal drip. She does have reflux symptoms and will sleep with two pillows to prevent reflux symptoms at night.   She denies second hand smoke exposure. She grew up in [place] and used a wood burning or charcoal burning stove to [employer]. There is no family history of lung diseases or cancer. Her sister was diagnosed with throat cancer.   Past Medical History:  Diagnosis Date  . Allergy   . Depression   . Family history of breast cancer   . Family history of kidney cancer   . Family history of throat cancer   . Frequent headaches   . Heart  murmur   . History of recurrent UTIs   . Hypertension   . Migraines   . Urinary incontinence      Family History  Problem Relation Age of Onset  . Heart attack Mother   . Heart disease Mother   . Hypertension Mother   . Early death Father        unknown cause  . Hypertension Father   . Hypertension Sister   . Diabetes Brother   . Hypertension Brother   . Asthma Sister   . Hypertension Sister   . Hyperlipidemia Brother   . Hypertension Brother   . Mental illness Brother   . Heart disease Sister   . Hypertension Sister   . Arthritis Brother   . Diabetes Brother   . Hypertension Brother   . Breast cancer Cousin 64       paternal first cousin  . Throat cancer Sister 66       non-smoker  . Breast cancer Cousin        dx. in her 101s, paternal first cousin once removed  . Kidney cancer Other 69     Social History   Socioeconomic History  . Marital status: Married    Spouse name: Not on file  . Number of children: Not on file  . Years of education: Not on file  . Highest education level:  Not on file  Occupational History  . Not on file  Tobacco Use  . Smoking status: Never Smoker  . Smokeless tobacco: Never Used  Vaping Use  . Vaping Use: Never used  Substance and Sexual Activity  . Alcohol use: Not Currently  . Drug use: Never  . Sexual activity: Yes    Partners: Male  Other Topics Concern  . Not on file  Social History Narrative  . Not on file   Social Determinants of Health   Financial Resource Strain: Not on file  Food Insecurity: Not on file  Transportation Needs: Not on file  Physical Activity: Not on file  Stress: Not on file  Social Connections: Not on file  Intimate Partner Violence: Not on file     Allergies  Allergen Reactions  . Trazodone And Nefazodone     Dizziness  . Zoloft [Sertraline Hcl]     Anxiousness, lash out at kids     Outpatient Medications Prior to Visit  Medication Sig Dispense Refill  . acetaminophen (TYLENOL) 325  MG tablet Take 650 mg by mouth every 6 (six) hours as needed.    Marland Kitchen albuterol (VENTOLIN HFA) 108 (90 Base) MCG/ACT inhaler Inhale 1-2 puffs into the lungs every 6 (six) hours as needed for wheezing or shortness of breath. 1 each 1  . benzonatate (TESSALON) 100 MG capsule Take 1 capsule (100 mg total) by mouth 2 (two) times daily as needed for cough. 20 capsule 0  . diphenhydrAMINE (BENADRYL) 25 mg capsule Take 25 mg by mouth at bedtime as needed.    . hydrochlorothiazide (HYDRODIURIL) 25 MG tablet TAKE 1 TABLET (25 MG TOTAL) BY MOUTH DAILY. 90 tablet 1  . ibuprofen (ADVIL) 600 MG tablet Take 1 tablet (600 mg total) by mouth every 6 (six) hours as needed. 30 tablet 0  . methylPREDNISolone (MEDROL DOSEPAK) 4 MG TBPK tablet Take as directed on package 21 tablet 0  . ondansetron (ZOFRAN) 4 MG tablet Take 1 tablet (4 mg total) by mouth every 8 (eight) hours as needed for nausea or vomiting. 20 tablet 0  . tamoxifen (NOLVADEX) 20 MG tablet Take 1 tablet (20 mg total) by mouth daily. 90 tablet 3  . verapamil (CALAN SR) 180 MG CR tablet Take 1 tablet (180 mg total) by mouth daily. In the morning. 90 tablet 1  . fluticasone (FLONASE) 50 MCG/ACT nasal spray Place 1-2 sprays into both nostrils daily for 7 days. 1 g 0  . HYDROcodone-homatropine (HYCODAN) 5-1.5 MG/5ML syrup Take 5 mLs by mouth every 6 (six) hours as needed. 120 mL 0   No facility-administered medications prior to visit.    Review of Systems  Constitutional: Negative for chills, fever, malaise/fatigue and weight loss.  HENT: Negative for congestion and sore throat.   Eyes: Negative.   Respiratory: Positive for cough. Negative for hemoptysis, sputum production, shortness of breath and wheezing.   Cardiovascular: Negative for chest pain, palpitations, orthopnea, claudication and leg swelling.  Gastrointestinal: Positive for heartburn. Negative for abdominal pain, nausea and vomiting.  Genitourinary: Negative.   Musculoskeletal: Negative.    Skin: Negative for rash.  Neurological: Negative.   Endo/Heme/Allergies: Negative.   Psychiatric/Behavioral: Negative.    Objective:   Vitals:   11/20/20 1107  BP: 124/72  Pulse: 69  Temp: 98.4 F (36.9 C)  TempSrc: Temporal  SpO2: 99%  Weight: 139 lb (63 kg)  Height: 5\' 3"  (1.6 m)     Physical Exam Constitutional:      General:  She is not in acute distress.    Appearance: Normal appearance. She is normal weight. She is not ill-appearing.  HENT:     Head: Normocephalic and atraumatic.     Nose: Nose normal.     Mouth/Throat:     Mouth: Mucous membranes are moist.     Pharynx: Oropharynx is clear.  Eyes:     General: No scleral icterus.    Conjunctiva/sclera: Conjunctivae normal.     Pupils: Pupils are equal, round, and reactive to light.  Cardiovascular:     Rate and Rhythm: Normal rate and regular rhythm.     Pulses: Normal pulses.     Heart sounds: Normal heart sounds. No murmur heard.   Pulmonary:     Effort: Pulmonary effort is normal.     Breath sounds: Normal breath sounds.  Abdominal:     General: Bowel sounds are normal.     Palpations: Abdomen is soft.  Musculoskeletal:     Right lower leg: No edema.     Left lower leg: No edema.  Lymphadenopathy:     Cervical: No cervical adenopathy.  Skin:    General: Skin is warm and dry.  Neurological:     General: No focal deficit present.     Mental Status: She is alert.  Psychiatric:        Mood and Affect: Mood normal.        Behavior: Behavior normal.        Thought Content: Thought content normal.        Judgment: Judgment normal.    CBC    Component Value Date/Time   WBC 5.1 02/14/2020 0847   WBC 5.1 05/04/2018 0943   RBC 4.36 02/14/2020 0847   HGB 11.7 (L) 02/14/2020 0847   HCT 36.1 02/14/2020 0847   PLT 308 02/14/2020 0847   MCV 82.8 02/14/2020 0847   MCH 26.8 02/14/2020 0847   MCHC 32.4 02/14/2020 0847   RDW 13.7 02/14/2020 0847   LYMPHSABS 2.2 02/14/2020 0847   MONOABS 0.6  02/14/2020 0847   EOSABS 0.2 02/14/2020 0847   BASOSABS 0.0 02/14/2020 0847   BMP Latest Ref Rng & Units 02/14/2020 05/04/2018  Glucose 70 - 99 mg/dL 107(H) 110(H)  BUN 6 - 20 mg/dL 12 13  Creatinine 0.44 - 1.00 mg/dL 0.77 0.65  Sodium 135 - 145 mmol/L 140 136  Potassium 3.5 - 5.1 mmol/L 3.8 3.7  Chloride 98 - 111 mmol/L 106 102  CO2 22 - 32 mmol/L 27 28  Calcium 8.9 - 10.3 mg/dL 8.9 9.5   Chest imaging: CXR 11/20/2020 The heart size and mediastinal contours are within normal limits. Both lungs are clear. The visualized skeletal structures are Unremarkable.  PFT: No flowsheet data found.   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   Her cough appears to be related to reactive airways disease with concern for asthma. She has history of wood/charcoal stove exposure from childhood which could predispose her to developing asthma. She has reflux and seasonal allergies that could also aggravate her airways. She had absolute eosinophil count of 200 in April 2021. Her  chest radiograph is unremarkable today. She does not have radiographic evidence of post-radiation changes from her breast cancer treatment.   We will start her on flovent 1107mcg 2 puffs twice daily along with as needed albuterol. We will consider adding singulair in the future if her seasonal allergies aggravate her breathing/cough. She is to start PPI therapy for her reflux disease and recommended elevating the head of her bed with blocks.   She is to follow up in 2 months with pulmonary function testing.          Current Outpatient Medications:  .  acetaminophen (TYLENOL) 325 MG tablet, Take 650 mg by mouth every 6 (six) hours as needed., Disp: , Rfl:  .  albuterol (VENTOLIN HFA) 108 (90 Base) MCG/ACT inhaler, Inhale 1-2 puffs into the lungs every 6 (six) hours as needed for wheezing or shortness of breath., Disp: 1 each, Rfl: 1 .  benzonatate (TESSALON) 100 MG capsule, Take 1 capsule (100 mg total) by mouth 2 (two) times daily as needed for cough., Disp: 20 capsule, Rfl: 0 .  diphenhydrAMINE (BENADRYL) 25 mg capsule, Take 25 mg by mouth at bedtime as needed., Disp: , Rfl:  .  fluticasone (FLOVENT HFA) 110 MCG/ACT inhaler, Inhale 2 puffs into the lungs in the morning and at bedtime., Disp: 1 each, Rfl: 12 .  hydrochlorothiazide (HYDRODIURIL) 25 MG tablet, TAKE 1 TABLET (25 MG TOTAL) BY MOUTH DAILY., Disp: 90 tablet, Rfl: 1 .  ibuprofen (ADVIL) 600 MG tablet, Take 1 tablet (600 mg total) by mouth every 6 (six) hours as needed., Disp: 30 tablet, Rfl: 0 .  methylPREDNISolone (MEDROL DOSEPAK) 4 MG TBPK tablet, Take as directed on package, Disp: 21 tablet, Rfl: 0 .  ondansetron (ZOFRAN) 4 MG tablet, Take 1 tablet (4 mg total) by mouth every 8 (eight) hours as needed for nausea or vomiting., Disp: 20 tablet, Rfl: 0 .  pantoprazole (PROTONIX) 40 MG tablet, Take 1 tablet (40 mg total) by mouth daily., Disp: 30 tablet, Rfl: 6 .  tamoxifen (NOLVADEX) 20 MG tablet, Take 1 tablet (20 mg total) by mouth daily., Disp: 90 tablet, Rfl: 3 .  verapamil (CALAN SR) 180 MG CR tablet, Take 1 tablet (180 mg total) by mouth daily. In the morning., Disp: 90 tablet, Rfl: 1 .  fluticasone (FLONASE) 50 MCG/ACT nasal spray, Place 1-2 sprays into both nostrils daily for 7 days., Disp: 1 g, Rfl: 0

## 2020-11-20 NOTE — Patient Instructions (Signed)
Start flovent 129mcg 2 puffs twice daily with spacer (rinse mouth out after each use)  Continue as needed albuterol use, 1-2 puffs every 4-6 hours as needed.   We will check a chest x-ray today and pulmonary function tests at the next follow up visit.   Start pantoprazole for acid relfux, take 1 tablet everyday.  Elevated the head of your bed with blocks to reduce nighttime reflux

## 2020-11-29 MED FILL — FLOVENT HFA 110 MCG INHALER: 110 | 30 days supply | Qty: 12 | Fill #0

## 2020-11-29 MED FILL — PANTOPRAZOLE SOD DR 40 MG T: 40 | 30 days supply | Qty: 30 | Fill #0

## 2020-12-03 ENCOUNTER — Ambulatory Visit: Payer: No Typology Code available for payment source | Admitting: Family Medicine

## 2021-01-09 ENCOUNTER — Ambulatory Visit: Payer: No Typology Code available for payment source | Admitting: Family Medicine

## 2021-01-21 ENCOUNTER — Emergency Department (HOSPITAL_COMMUNITY): Payer: No Typology Code available for payment source

## 2021-01-21 ENCOUNTER — Other Ambulatory Visit: Payer: Self-pay

## 2021-01-21 ENCOUNTER — Emergency Department (HOSPITAL_COMMUNITY)
Admission: EM | Admit: 2021-01-21 | Discharge: 2021-01-22 | Disposition: A | Discharge status: Home / Self Care | Payer: No Typology Code available for payment source | Attending: Emergency Medicine | Admitting: Emergency Medicine

## 2021-01-21 ENCOUNTER — Encounter (HOSPITAL_COMMUNITY): Payer: Self-pay | Admitting: Emergency Medicine

## 2021-01-21 DIAGNOSIS — Z79899 Other long term (current) drug therapy: Secondary | ICD-10-CM | POA: Diagnosis not present

## 2021-01-21 DIAGNOSIS — R079 Chest pain, unspecified: Secondary | ICD-10-CM | POA: Diagnosis present

## 2021-01-21 DIAGNOSIS — R0789 Other chest pain: Secondary | ICD-10-CM

## 2021-01-21 DIAGNOSIS — R911 Solitary pulmonary nodule: Secondary | ICD-10-CM | POA: Insufficient documentation

## 2021-01-21 DIAGNOSIS — I10 Essential (primary) hypertension: Secondary | ICD-10-CM | POA: Insufficient documentation

## 2021-01-21 DIAGNOSIS — D360 Benign neoplasm of lymph nodes: Secondary | ICD-10-CM | POA: Insufficient documentation

## 2021-01-21 LAB — I-STAT BETA HCG BLOOD, ED (MC, WL, AP ONLY): I-stat hCG, quantitative: <5 m[IU]/mL (ref <5)

## 2021-01-21 IMAGING — CR DG CHEST 2V
2 series · 2 of 2 positions shown · non-contrast
Comparison: [DATE]

CLINICAL DATA: Chest pain

EXAM:
CHEST - 2 VIEW

[w chest pa]
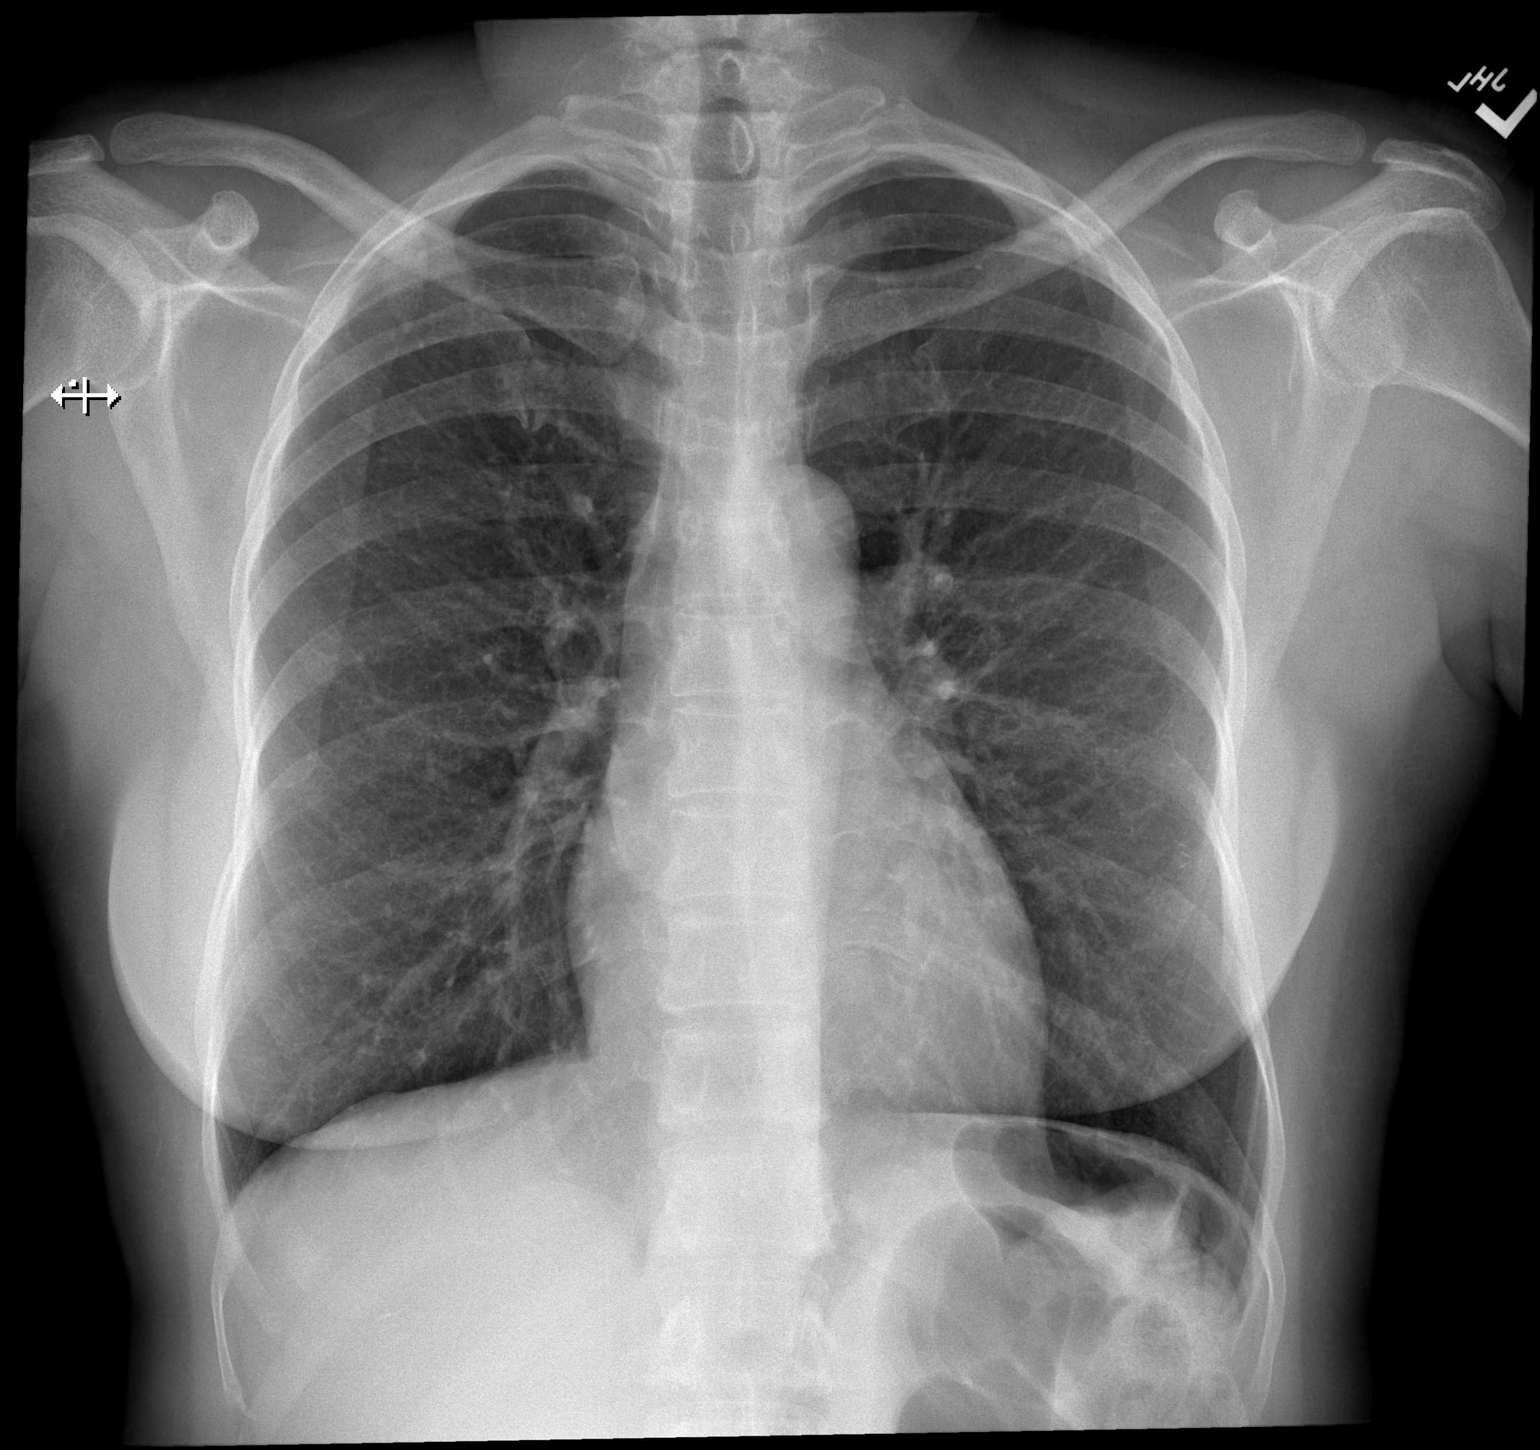

[w chest lat]
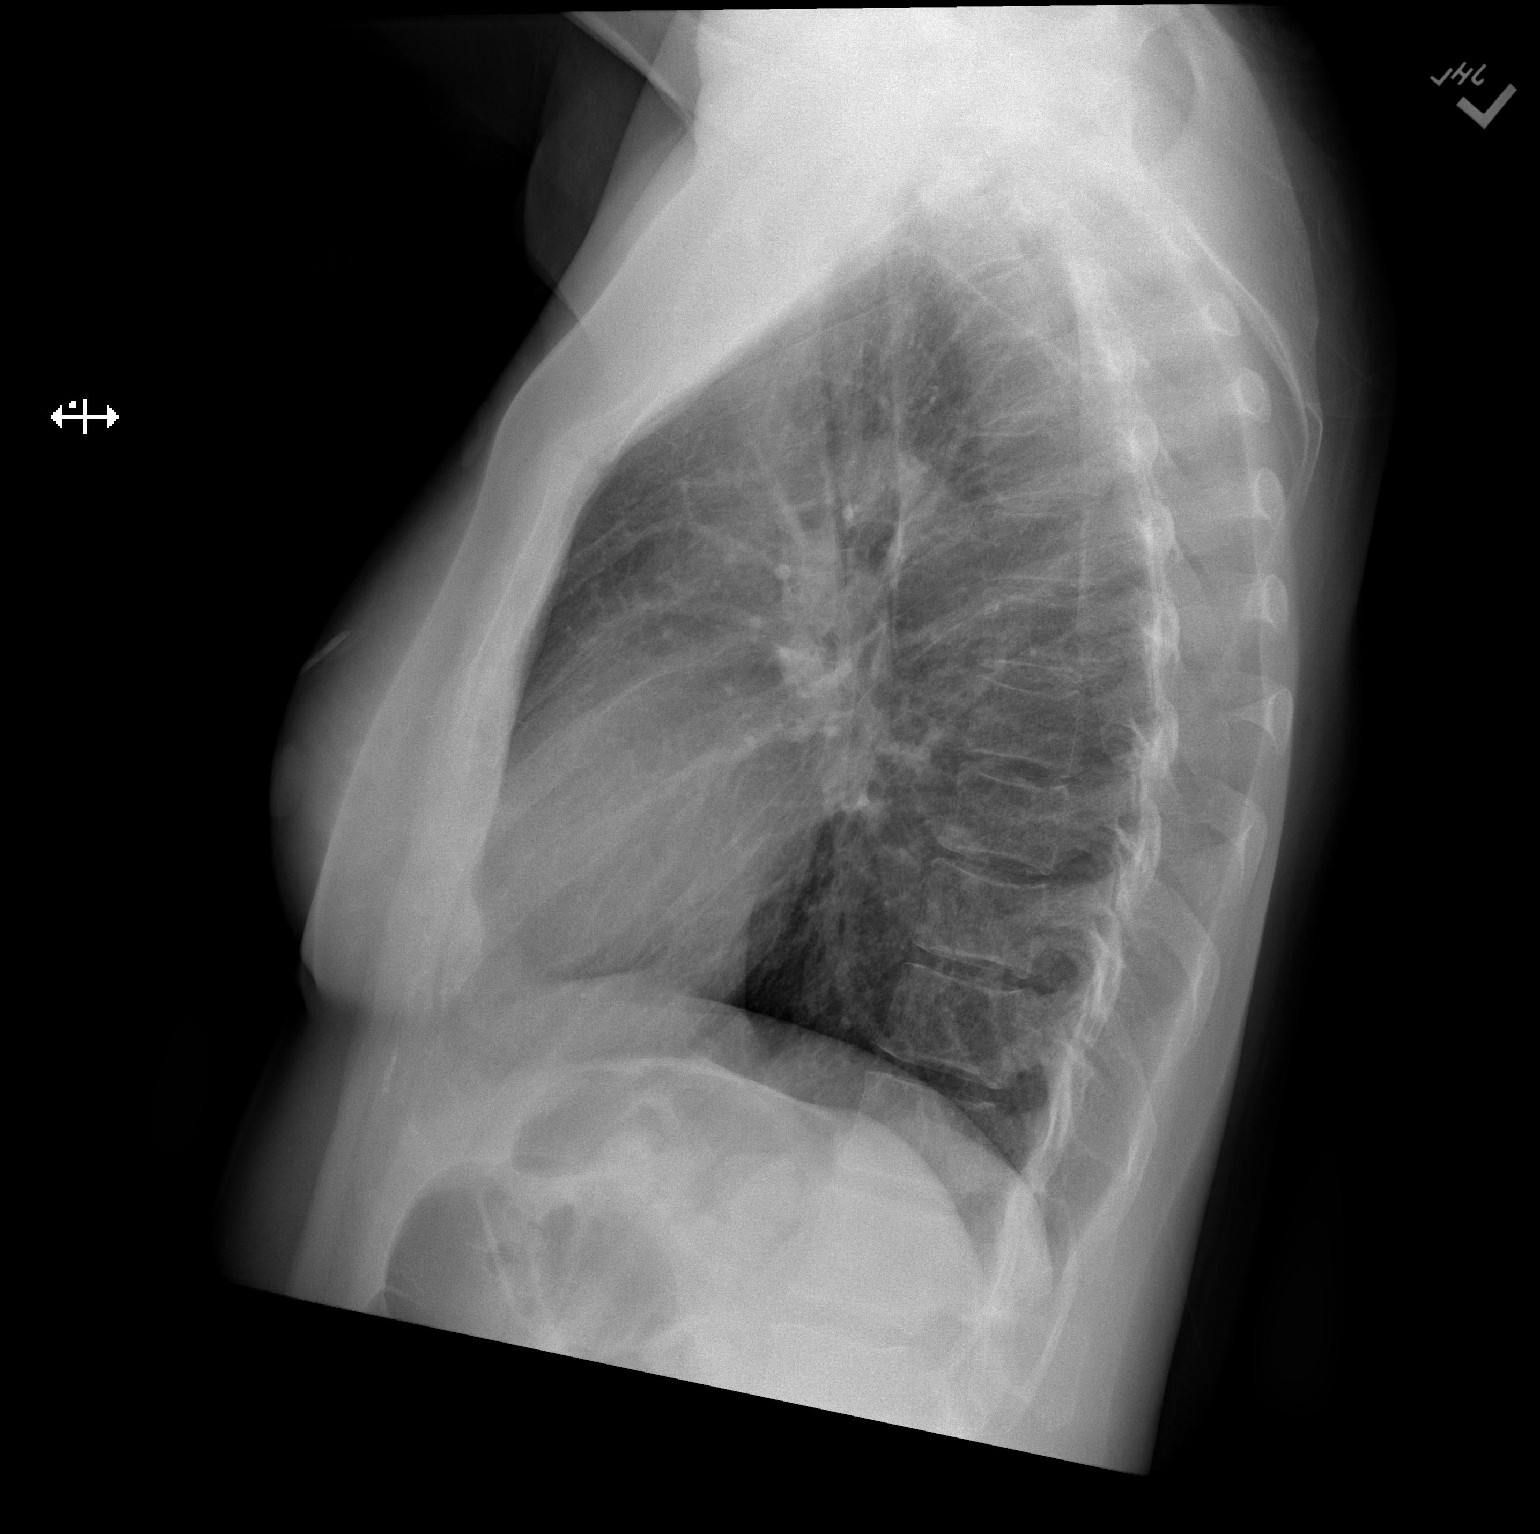

[2 of 2 positions shown; findings below may reference images not displayed]

FINDINGS: The heart size and mediastinal contours are within normal limits.
Both lungs are clear. The visualized skeletal structures are
unremarkable.
IMPRESSION: No active cardiopulmonary disease.

## 2021-01-21 NOTE — ED Notes (Signed)
Patient went to xray 

## 2021-01-22 ENCOUNTER — Telehealth: Payer: Self-pay | Admitting: *Deleted

## 2021-01-22 ENCOUNTER — Encounter (HOSPITAL_COMMUNITY): Payer: Self-pay

## 2021-01-22 ENCOUNTER — Emergency Department (HOSPITAL_COMMUNITY): Payer: No Typology Code available for payment source

## 2021-01-22 DIAGNOSIS — R911 Solitary pulmonary nodule: Secondary | ICD-10-CM | POA: Diagnosis not present

## 2021-01-22 LAB — CBC
HCT: 36.4 % (ref 36.0–46.0)
Hemoglobin: 11.7 g/dL — ABNORMAL LOW (ref 12.0–15.0)
MCH: 26.8 pg (ref 26.0–34.0)
MCHC: 32.1 g/dL (ref 30.0–36.0)
MCV: 83.3 fL (ref 80.0–100.0)
Platelets: 290 K/uL (ref 150–400)
RBC: 4.37 MIL/uL (ref 3.87–5.11)
RDW: 13.3 % (ref 11.5–15.5)
WBC: 4.7 K/uL (ref 4.0–10.5)
nRBC: 0 % (ref 0.0–0.2)

## 2021-01-22 LAB — BASIC METABOLIC PANEL WITH GFR
Anion gap: 14 (ref 5–15)
BUN: 16 mg/dL (ref 6–20)
CO2: 21 mmol/L — ABNORMAL LOW (ref 22–32)
Calcium: 9.3 mg/dL (ref 8.9–10.3)
Chloride: 103 mmol/L (ref 98–111)
Creatinine, Ser: 0.84 mg/dL (ref 0.44–1.00)
GFR, Estimated: >60 mL/min
Glucose, Bld: 97 mg/dL (ref 70–99)
Potassium: 3.5 mmol/L (ref 3.5–5.1)
Sodium: 138 mmol/L (ref 135–145)

## 2021-01-22 LAB — HEPATIC FUNCTION PANEL
ALT: 15 U/L (ref 0–44)
AST: 18 U/L (ref 15–41)
Albumin: 4 g/dL (ref 3.5–5.0)
Alkaline Phosphatase: 63 U/L (ref 38–126)
Bilirubin, Direct: 0.1 mg/dL (ref 0.0–0.2)
Indirect Bilirubin: 0.2 mg/dL — ABNORMAL LOW (ref 0.3–0.9)
Total Bilirubin: 0.3 mg/dL (ref 0.3–1.2)
Total Protein: 7.4 g/dL (ref 6.5–8.1)

## 2021-01-22 LAB — D-DIMER, QUANTITATIVE: D-Dimer, Quant: 0.7 ug{FEU}/mL — ABNORMAL HIGH (ref 0.00–0.50)

## 2021-01-22 LAB — TROPONIN I (HIGH SENSITIVITY)
Troponin I (High Sensitivity): <2 ng/L
Troponin I (High Sensitivity): <2 ng/L (ref <18)

## 2021-01-22 LAB — LIPASE, BLOOD: Lipase: 45 U/L (ref 11–51)

## 2021-01-22 IMAGING — CT CT ANGIO CHEST
2 of 6 series · 18 of 36 positions shown · IV contrast (OMNIPAQUE 350)
Comparison: None.

CLINICAL DATA: Central chest pain, elevated d-dimer. Hx of HTN,
left breast ca- lumpectomy.

EXAM:
CT ANGIOGRAPHY CHEST WITH CONTRAST
TECHNIQUE: Multidetector CT imaging of the chest was performed using the
standard protocol during bolus administration of intravenous
contrast. Multiplanar CT image reconstructions and MIPs were
obtained to evaluate the vascular anatomy.
CONTRAST:  100mL OMNIPAQUE IOHEXOL 350 MG/ML SOLN

[Series 5: thins · axial · 0.56mm/px · z∈[+1442,+1625]mm · 17 of 207 slices shown]
[im 12/207  lung]
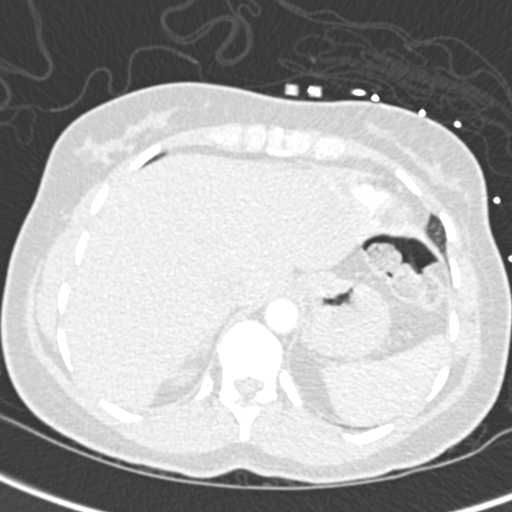
[im 23/207  mediastinal]
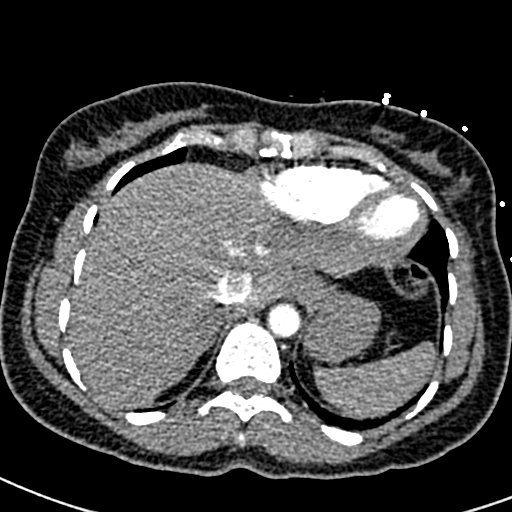
[im 35/207  lung]
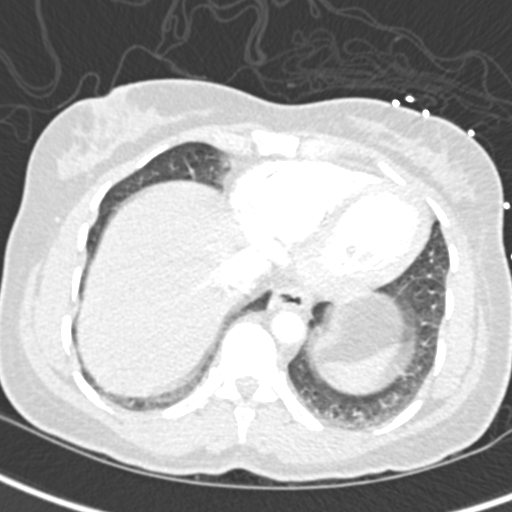
[im 46/207  mediastinal]
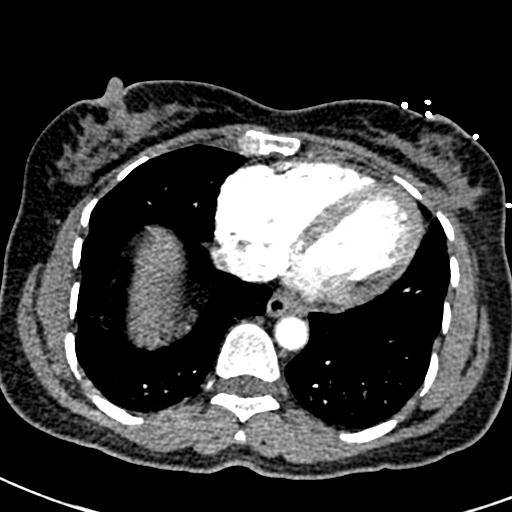
[im 58/207  lung]
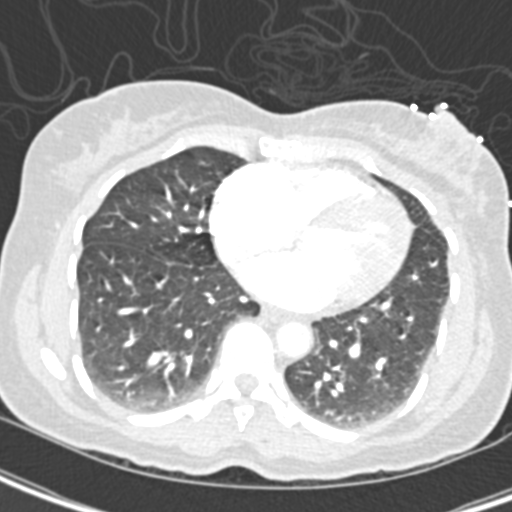
[im 69/207  mediastinal]
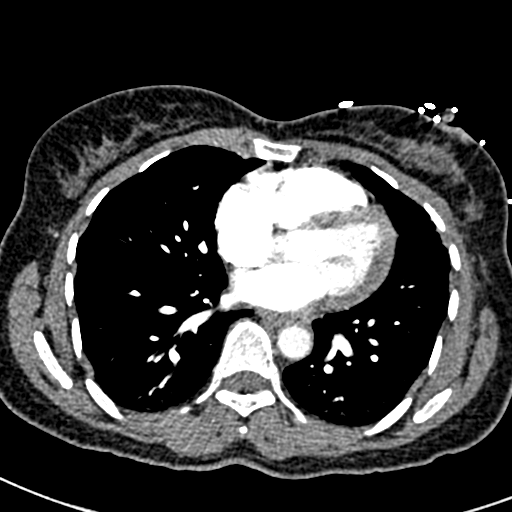
[im 81/207  lung]
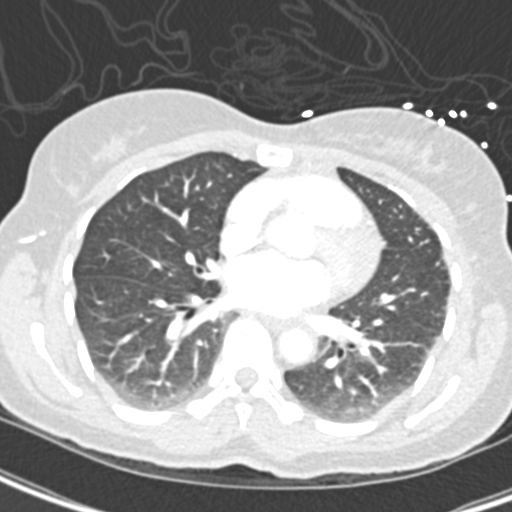
[im 92/207  mediastinal]
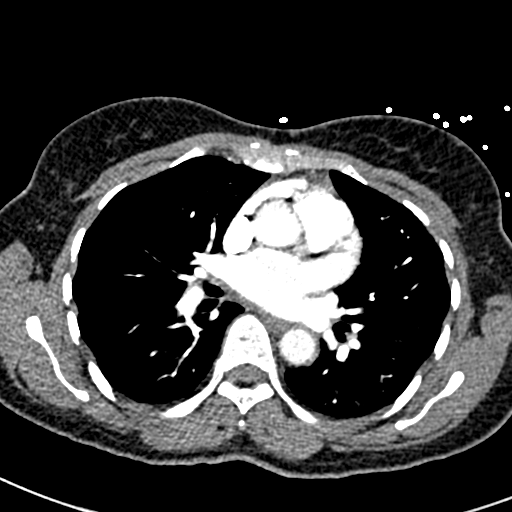
[im 104/207  lung]
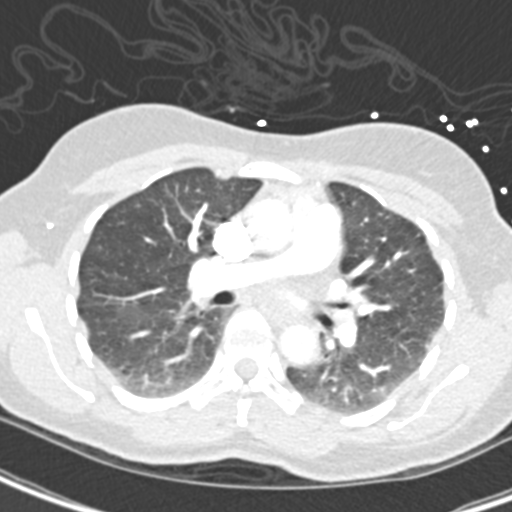
[im 115/207  mediastinal]
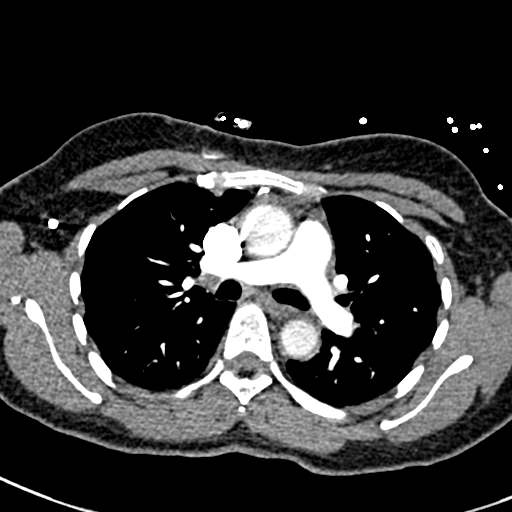
[im 126/207  lung]
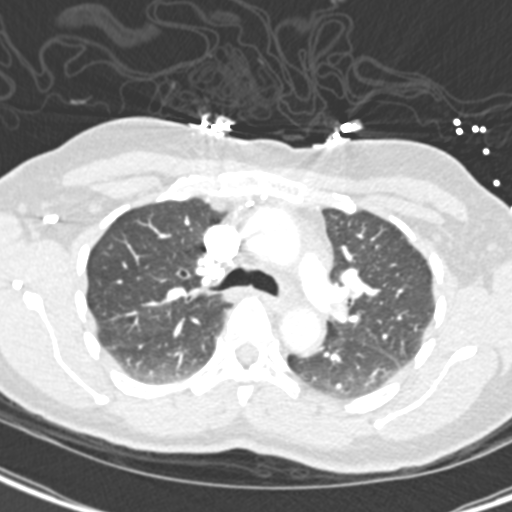
[im 138/207  mediastinal]
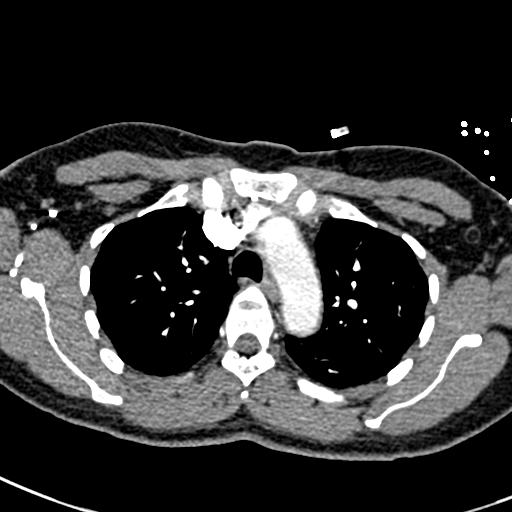
[im 149/207  lung]
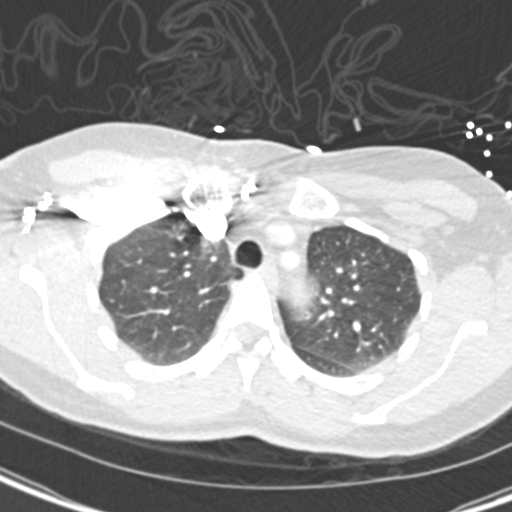
[im 161/207  mediastinal]
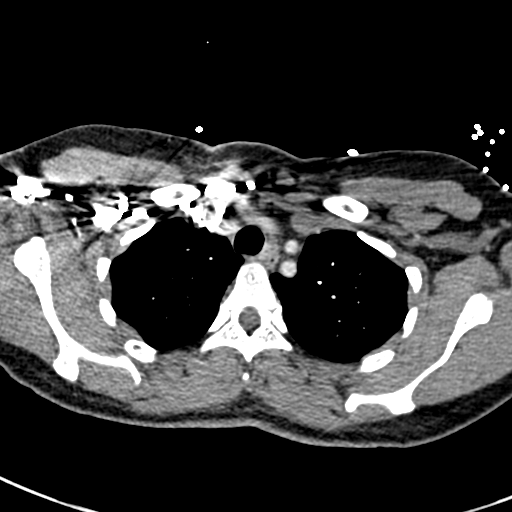
[im 172/207  lung]
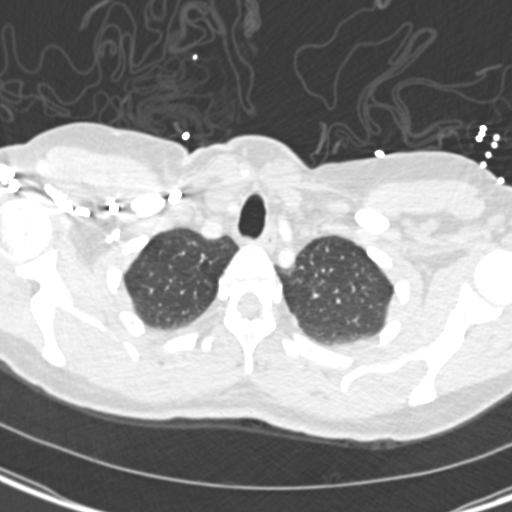
[im 184/207  mediastinal]
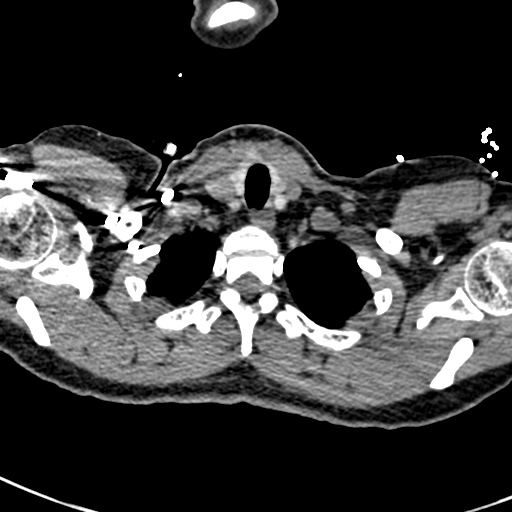
[im 195/207  lung]
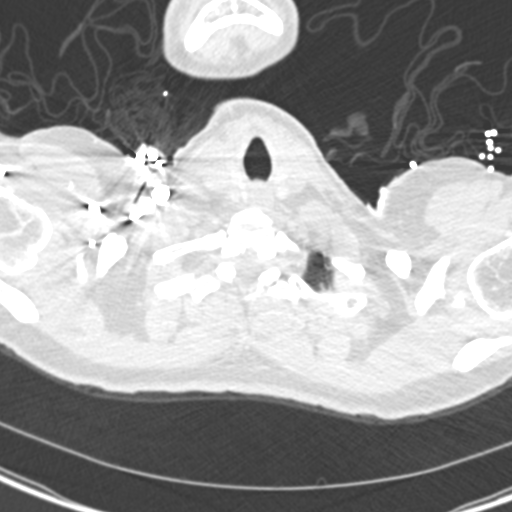

[Series 7: coronal mpr · coronal · 0.42mm/px · 1 of 101 slices shown]
[im 51/101  mediastinal]
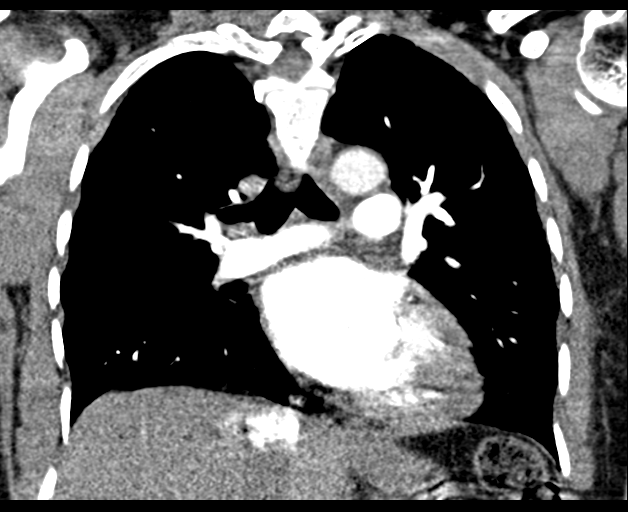

[18 of 36 positions shown; findings below may reference images not displayed]

FINDINGS: Cardiovascular: Satisfactory opacification of the pulmonary arteries
to the segmental level. No evidence of pulmonary embolism. The main
pulmonary artery is normal in caliber. Normal heart size. No
pericardial effusion.

Mediastinum/Nodes: No enlarged mediastinal, hilar, or axillary lymph
nodes. Thyroid gland, trachea, and esophagus demonstrate no
significant findings.

Lungs/Pleura: Trace biapical pleural/pulmonary scarring. Bilateral
lower lobe subsegmental atelectasis. 3 mm right upper lobe nodule
([DATE]). No focal consolidation. No pleural effusion. No
pneumothorax.

Upper Abdomen: No acute abnormality.

Musculoskeletal: A 0.7 cm round soft tissue density within the left
breast likely represents an intramammary lymph node ([DATE]).
Surgical changes to the left breast laterally are noted. No acute or
significant osseous findings.

Review of the MIP images confirms the above findings.
IMPRESSION: 1. No pulmonary embolus.
2. No acute intrathoracic abnormality.
3. An indeterminate 3 mm right upper lobe nodule.
4. A 0.7 cm round soft tissue density within the left breast likely
represents an intramammary lymph node. Correlate with mammography.

## 2021-01-22 MED ORDER — IOHEXOL 350 MG/ML SOLN
100.0000 mL | Freq: Once | INTRAVENOUS | Status: AC | PRN
  Administered 2021-01-22: 100 mL via INTRAVENOUS

## 2021-01-22 MED ORDER — NITROGLYCERIN 0.4 MG SL SUBL: 0.4000 mg | SUBLINGUAL_TABLET | SUBLINGUAL | Status: DC | PRN

## 2021-01-22 NOTE — Telephone Encounter (Signed)
Received call from pt stating she was seen in the ED last night for chest pain.  States CT angio was preformed and pt found to have a mass in her right lobe.  Pt with hx of breast cancer and wishing to be seen by MD to discuss scan in more detail.  Apt scheduled and pt verbalized understanding of apt date and time.

## 2021-01-22 NOTE — ED Provider Notes (Signed)
[facility] DEPT Provider Note   CSN: [id number] Arrival date & time: 01/21/21  2306     History Chief Complaint  Patient presents with  . Chest Pain    Victoria Maddox is a 45 y.o. female presenting for evaluation of chest pain.   Pt states she had 3 episodes of chest pain yesterday. Today, her pain returned, but has been constant for several hrs. Pain is worse with inspiration.  She has taken ASA (total of 324 mg) PTA wihtout improvement of symptoms.  Pain does not radiate.  She denies associated shortness of breath, cough, nausea, vomiting, or diaphoresis.  No history of similar chest pain.  She denies recent fevers, chills, abdominal pain, urinary symptoms, normal bowel movements.  She has a history of high blood pressure for she takes medication.  Additional history of breast cancer status post lumpectomy and radiation, currently on tamoxifen.  History of asthma and GERD.  She reports her dad died of a heart attack at 45 years old, mom died of a heart attack at 38 years old.  She denies tobacco, alcohol, or drug use.  No personal history of known cardiac disease.  No history of diabetes or hyperlipidemia.  She denies recent travel, surgeries, immobilization, history of previous DVT/PE, or hormone use.  Additional history obtained from chart review.  Per chart review, patient with a history of depression, breast cancer, hypertension,  HPI     Past Medical History:  Diagnosis Date  . Allergy   . Depression   . Family history of breast cancer   . Family history of kidney cancer   . Family history of throat cancer   . Frequent headaches   . Heart murmur   . History of recurrent UTIs   . Hypertension   . Migraines   . Urinary incontinence     Patient Active Problem List   Diagnosis Date Noted  . Genetic testing 02/22/2020  . Family history of breast cancer   . Family history of throat cancer   . Family history of kidney cancer   . 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 Bronchitis 09/27/2018  . HTN (hypertension) 05/04/2018  . Insomnia 05/04/2018  . Adjustment disorder with mixed anxiety and depressed mood 05/04/2018    Past Surgical History:  Procedure Laterality Date  . BREAST LUMPECTOMY WITH RADIOACTIVE SEED LOCALIZATION Left 03/12/2020   Procedure: LEFT BREAST LUMPECTOMY WITH BRACKETED RADIOACTIVE SEED LOCALIZATION;  Surgeon: Rolm Bookbinder, MD;  Location: [facility];  Service: General;  Laterality: Left;  . DILATION AND CURETTAGE OF UTERUS       OB History   No obstetric history on file.     Family History  Problem Relation Age of Onset  . Heart attack Mother   . Heart disease Mother   . Hypertension Mother   . Early death Father        unknown cause  . Hypertension Father   . Hypertension Sister   . Diabetes Brother   . Hypertension Brother   . Asthma Sister   . Hypertension Sister   . Hyperlipidemia Brother   . Hypertension Brother   . Mental illness Brother   . Heart disease Sister   . Hypertension Sister   . Arthritis Brother   . Diabetes Brother   . Hypertension Brother   . Breast cancer Cousin 45       paternal first cousin  . Throat cancer Sister 37  non-smoker  . Breast cancer Cousin        dx. in her 11s, paternal first cousin once removed  . Kidney cancer Other 105    Social History   Tobacco Use  . Smoking status: Never Smoker  . Smokeless tobacco: Never Used  Vaping Use  . Vaping Use: Never used  Substance Use Topics  . Alcohol use: Not Currently  . Drug use: Never    Home Medications Prior to Admission medications   Medication Sig Start Date End Date Taking? Authorizing Provider  albuterol (VENTOLIN HFA) 108 (90 Base) MCG/ACT inhaler Inhale 1-2 puffs into the lungs every 6 (six) hours as needed for wheezing or shortness of breath. 08/29/20  Yes Providence Lanius A, PA-C  fluticasone (FLOVENT HFA) 110 MCG/ACT inhaler Inhale 2  puffs into the lungs in the morning and at bedtime. 11/20/20  Yes Freddi Starr, MD  pantoprazole (PROTONIX) 40 MG tablet Take 1 tablet (40 mg total) by mouth daily. Patient taking differently: Take 40 mg by mouth daily as needed. 11/20/20  Yes Freddi Starr, MD  verapamil (CALAN SR) 180 MG CR tablet Take 1 tablet (180 mg total) by mouth daily. In the morning. 04/09/20  Yes Libby Maw, MD  acetaminophen (TYLENOL) 325 MG tablet Take 650 mg by mouth every 6 (six) hours as needed.    [provider]  benzonatate (TESSALON) 100 MG capsule Take 1 capsule (100 mg total) by mouth 2 (two) times daily as needed for cough. 08/29/20   Volanda Napoleon, PA-C  diphenhydrAMINE (BENADRYL) 25 mg capsule Take 25 mg by mouth at bedtime as needed.    [provider]  fluticasone (FLONASE) 50 MCG/ACT nasal spray Place 1-2 sprays into both nostrils daily for 7 days. 08/23/20 08/30/20  Wieters, Hallie C, PA-C  hydrochlorothiazide (HYDRODIURIL) 25 MG tablet TAKE 1 TABLET (25 MG TOTAL) BY MOUTH DAILY. 11/27/19   Lucille Passy, MD  ibuprofen (ADVIL) 600 MG tablet Take 1 tablet (600 mg total) by mouth every 6 (six) hours as needed. 08/23/20   Wieters, Hallie C, PA-C  methylPREDNISolone (MEDROL DOSEPAK) 4 MG TBPK tablet Take as directed on package 09/11/20   Nche, Charlene Brooke, NP  ondansetron (ZOFRAN) 4 MG tablet Take 1 tablet (4 mg total) by mouth every 8 (eight) hours as needed for nausea or vomiting. 09/30/19   Ann Held, DO  tamoxifen (NOLVADEX) 20 MG tablet Take 1 tablet (20 mg total) by mouth daily. 06/28/20   Nicholas Lose, MD    Allergies    Trazodone and nefazodone and Zoloft [sertraline hcl]  Review of Systems   Review of Systems  Cardiovascular: Positive for chest pain.  All other systems reviewed and are negative.   Physical Exam Updated Vital Signs BP 135/81   Pulse 69   Temp 98 F (36.7 C) (Oral)   Resp 19   Ht 5\' 3"  (1.6 m)   Wt 63.5 kg   SpO2 100%    BMI 24.80 kg/m   Physical Exam Vitals and nursing note reviewed.  Constitutional:      General: She is not in acute distress.    Appearance: She is well-developed.     Comments: Resting in the bed in NAD  HENT:     Head: Normocephalic and atraumatic.  Eyes:     Conjunctiva/sclera: Conjunctivae normal.     Pupils: Pupils are equal, round, and reactive to light.  Cardiovascular:     Rate and Rhythm: Normal  rate and regular rhythm.     Pulses: Normal pulses.  Pulmonary:     Effort: Pulmonary effort is normal. No respiratory distress.     Breath sounds: Normal breath sounds. No wheezing.     Comments: Mild ttp of sternum and R sternal border.  Speaking in full sentences.  Clear lung sounds. Chest:     Chest wall: Tenderness present.  Abdominal:     General: There is no distension.     Palpations: Abdomen is soft. There is no mass.     Tenderness: There is no abdominal tenderness. There is no guarding or rebound.  Musculoskeletal:        General: Normal range of motion.     Cervical back: Normal range of motion and neck supple.     Right lower leg: No edema.     Left lower leg: No edema.  Skin:    General: Skin is warm and dry.     Capillary Refill: Capillary refill takes less than 2 seconds.  Neurological:     Mental Status: She is alert and oriented to person, place, and time.     ED Results / Procedures / Treatments   Labs (all labs ordered are listed, but only abnormal results are displayed) Labs Reviewed  BASIC METABOLIC PANEL - Abnormal; Notable for the following components:      Result Value   CO2 21 (*)    All other components within normal limits  CBC - Abnormal; Notable for the following components:   Hemoglobin 11.7 (*)    All other components within normal limits  HEPATIC FUNCTION PANEL - Abnormal; Notable for the following components:   Indirect Bilirubin 0.2 (*)    All other components within normal limits  D-DIMER, QUANTITATIVE - Abnormal; Notable  for the following components:   D-Dimer, Quant 0.70 (*)    All other components within normal limits  LIPASE, BLOOD  I-STAT BETA HCG BLOOD, ED ([facility], [facility], [facility] ONLY)  TROPONIN I (HIGH SENSITIVITY)  TROPONIN I (HIGH SENSITIVITY)    EKG EKG Interpretation  Date/Time:  Tuesday January 21 2021 23:26:35 EDT Ventricular Rate:  70 PR Interval:    QRS Duration: 87 QT Interval:  417 QTC Calculation: 450 R Axis:   55 Text Interpretation: Sinus rhythm Normal ECG Confirmed by Orpah Greek [phone]) on 01/21/2021 11:29:17 PM   Radiology DG Chest 2 View  Result Date: 01/21/2021 CLINICAL DATA:  Chest pain EXAM: CHEST - 2 VIEW COMPARISON:  11/20/2020 FINDINGS: The heart size and mediastinal contours are within normal limits. Both lungs are clear. The visualized skeletal structures are unremarkable. IMPRESSION: No active cardiopulmonary disease. Electronically Signed   By: Donavan Foil M.D.   On: 01/21/2021 23:23   CT Angio Chest PE W and/or Wo Contrast  Result Date: 01/22/2021 CLINICAL DATA:  Central chest pain, elevated d-dimer. Hx of HTN, left breast ca- lumpectomy. EXAM: CT ANGIOGRAPHY CHEST WITH CONTRAST TECHNIQUE: Multidetector CT imaging of the chest was performed using the standard protocol during bolus administration of intravenous contrast. Multiplanar CT image reconstructions and MIPs were obtained to evaluate the vascular anatomy. CONTRAST:  177mL OMNIPAQUE IOHEXOL 350 MG/ML SOLN COMPARISON:  None. FINDINGS: Cardiovascular: Satisfactory opacification of the pulmonary arteries to the segmental level. No evidence of pulmonary embolism. The main pulmonary artery is normal in caliber. Normal heart size. No pericardial effusion. Mediastinum/Nodes: No enlarged mediastinal, hilar, or axillary lymph nodes. Thyroid gland, trachea, and esophagus demonstrate no significant findings. Lungs/Pleura: Trace biapical pleural/pulmonary scarring.  Bilateral lower lobe subsegmental atelectasis. 3 mm right upper  lobe nodule (6:50). No focal consolidation. No pleural effusion. No pneumothorax. Upper Abdomen: No acute abnormality. Musculoskeletal: A 0.7 cm round soft tissue density within the left breast likely represents an intramammary lymph node (5:142). Surgical changes to the left breast laterally are noted. No acute or significant osseous findings. Review of the MIP images confirms the above findings. IMPRESSION: 1. No pulmonary embolus. 2. No acute intrathoracic abnormality. 3. An indeterminate 3 mm right upper lobe nodule. 4. A 0.7 cm round soft tissue density within the left breast likely represents an intramammary lymph node. Correlate with mammography. Electronically Signed   By: Iven Finn M.D.   On: 01/22/2021 03:11    Procedures Procedures   Medications Ordered in ED Medications  nitroGLYCERIN (NITROSTAT) SL tablet 0.4 mg (has no administration in time range)  iohexol (OMNIPAQUE) 350 MG/ML injection 100 mL (100 mLs Intravenous Contrast Given 01/22/21 0226)    ED Course  I have reviewed the triage vital signs and the nursing notes.  Pertinent labs & imaging results that were available during my care of the patient were reviewed by me and considered in my medical decision making (see chart for details).    MDM Rules/Calculators/A&P                          Patient presented for evaluation of chest pain.  On exam, patient is nontoxic.  She does have some risk factors for ACS including family history and hypertension.  As such, obtain EKG, chest x-ray, labs.  Patient also with risk factors for PE and that she has pain with inspiration and history of cancer.  Will obtain D-dimer.  However patient is overall well-appearing, consider more benign causes such as MSK pain, pleuritis, GERD, anxiety.  Labs interpreted by me, overall reassuring.  Initial troponin negative.  D-dimer is usually minimally elevated at 0.7, will need CTA to rule out PE.  Chest x-ray viewed interpreted by me, no  pneumonia, pneumothorax or effusion.  EKG normal sinus rhythm.  Repeat troponin negative.  CTA negative for PE.  Per heart pathway, patient is low risk, does not need hospitalization for further emergent cardiac work-up. CT showed lung nodule and mass of L breast (likely intramammary lymph node). Discussed findings with patient.  Discussed symptomatic treatment, follow-up with PCP.  At this time, patient appears safe for discharge.  Return precautions given.  Patient states she understands and agrees to plan.  Final Clinical Impression(s) / ED Diagnoses Final diagnoses:  Atypical chest pain  Incidental lung nodule  Benign neoplasm of lymph nodes    Rx / DC Orders ED Discharge Orders    None       Franchot Heidelberg, PA-C 01/22/21 0406    Orpah Greek, MD 01/22/21 [phone]

## 2021-01-22 NOTE — Discharge Instructions (Addendum)
Continue taking home medications as prescribed. Use Tylenol and/or ibuprofen as needed for pain. Follow-up with your primary care doctor and/or your oncologist for further evaluation of the right sided lung nodule and lymph node that was visualized in the left breast. If you continue to have chest pain, follow-up with your primary care doctor for further evaluation. Return to the emergency room if develop severe worsening pain, difficulty breathing, become clammy/sweaty, or with any new, worsening, or concerning symptoms.

## 2021-01-26 NOTE — Progress Notes (Signed)
Patient Care Team: Libby Maw, MD as PCP - General (Family Medicine) Rolm Bookbinder, MD as Consulting Physician (General Surgery) Nicholas Lose, MD as Consulting Physician (Hematology and Oncology) Kyung Rudd, MD as Consulting Physician (Radiation Oncology)  DIAGNOSIS:    ICD-10-CM   1. Ductal carcinoma in situ (DCIS) of left breast  D05.12     SUMMARY OF ONCOLOGIC HISTORY: Oncology History  Ductal carcinoma in situ (DCIS) of left breast  02/08/2020 Initial Diagnosis   Screening mammogram detected left breast calcifications, 2.9cm. Biopsy showed DCIS with calcifications, low grade, ER+ 70%, PR+ 90%.   02/20/2020 Genetic Testing   Negative genetic testing:  No pathogenic variants detected on the [facility] Breast Cancer STAT or Multi-Cancer Panels. The report date is 02/20/2020.  The Breast Cancer STAT Panel offered by [facility] includes sequencing and deletion/duplication analysis for the following 9 genes:  ATM, BRCA1, BRCA2, CDH1, CHEK2, PALB2, PTEN, STK11 and TP53.  The Multi-Cancer Panel offered by [facility] includes sequencing and/or deletion duplication testing of the following 85 genes: AIP, ALK, APC, ATM, AXIN2,BAP1,  BARD1, BLM, BMPR1A, BRCA1, BRCA2, BRIP1, CASR, CDC73, CDH1, CDK4, CDKN1B, CDKN1C, CDKN2A (p14ARF), CDKN2A (p16INK4a), CEBPA, CHEK2, CTNNA1, DICER1, DIS3L2, EGFR (c.2369C>T, p.Thr790Met variant only), EPCAM (Deletion/duplication testing only), FH, FLCN, GATA2, GPC3, GREM1 (Promoter region deletion/duplication testing only), HOXB13 (c.251G>A, p.Gly84Glu), HRAS, KIT, MAX, MEN1, MET, MITF (c.952G>A, p.Glu318Lys variant only), MLH1, MSH2, MSH3, MSH6, MUTYH, NBN, NF1, NF2, NTHL1, PALB2, PDGFRA, PHOX2B, PMS2, POLD1, POLE, POT1, PRKAR1A, PTCH1, PTEN, RAD50, RAD51C, RAD51D, RB1, RECQL4, RET, RNF43, RUNX1, SDHAF2, SDHA (sequence changes only), SDHB, SDHC, SDHD, SMAD4, SMARCA4, SMARCB1, SMARCE1, STK11, SUFU, TERC, TERT, TMEM127, TP53, TSC1, TSC2, VHL, WRN and WT1.    03/12/2020 Surgery   Left lumpectomy Donne Hazel) [phone]): flat epithelial atypia with calcifications. Negative margins. No regional lymph nodes were examined. ER/PR +.   04/23/2020 - 06/07/2020 Radiation Therapy   The patient initially received a dose of 50.4 Gy in 28 fractions to the breast using whole-breast tangent fields. This was delivered using a 3-D conformal technique. The pt received a boost delivering an additional 10 Gy in 5 fractions using a electron boost with 28mV electrons. The total dose was 60.4 Gy.   06/2020 - 06/2025 Anti-estrogen oral therapy   Tamoxifen     CHIEF COMPLIANT: Follow-up of left breast DCIS on tamoxifen  INTERVAL HISTORY: Victoria ZODYis a 45y.o. with above-mentioned history of left breast DCIS who underwent a left lumpectomy, radiation, and is currently on antiestrogen therapy with tamoxifen.She presents to the clinic todayfor follow-up.   She was in the emergency room recently for chest discomfort and underwent a CT of the chest which showed a 3 mm lung nodule and 0.7 cm intramammary lymph node.  She was sent here for this further discussion. She does me that she is tolerating tamoxifen moderately well with the exception of hot flashes and sweats as well as mood swings.   ALLERGIES:  is allergic to trazodone and nefazodone and zoloft [sertraline hcl].  MEDICATIONS:  Current Outpatient Medications  Medication Sig Dispense Refill  . acetaminophen (TYLENOL) 325 MG tablet Take 650 mg by mouth every 6 (six) hours as needed.    .Marland Kitchenalbuterol (VENTOLIN HFA) 108 (90 Base) MCG/ACT inhaler Inhale 1-2 puffs into the lungs every 6 (six) hours as needed for wheezing or shortness of breath. 1 each 1  . benzonatate (TESSALON) 100 MG capsule Take 1 capsule (100 mg total) by mouth 2 (two) times daily as needed for cough.  20 capsule 0  . diphenhydrAMINE (BENADRYL) 25 mg capsule Take 25 mg by mouth at bedtime as needed.    . fluticasone (FLONASE) 50 MCG/ACT nasal  spray Place 1-2 sprays into both nostrils daily for 7 days. 1 g 0  . fluticasone (FLOVENT HFA) 110 MCG/ACT inhaler Inhale 2 puffs into the lungs in the morning and at bedtime. 1 each 12  . hydrochlorothiazide (HYDRODIURIL) 25 MG tablet TAKE 1 TABLET (25 MG TOTAL) BY MOUTH DAILY. 90 tablet 1  . ibuprofen (ADVIL) 600 MG tablet Take 1 tablet (600 mg total) by mouth every 6 (six) hours as needed. 30 tablet 0  . methylPREDNISolone (MEDROL DOSEPAK) 4 MG TBPK tablet Take as directed on package 21 tablet 0  . ondansetron (ZOFRAN) 4 MG tablet Take 1 tablet (4 mg total) by mouth every 8 (eight) hours as needed for nausea or vomiting. 20 tablet 0  . pantoprazole (PROTONIX) 40 MG tablet Take 1 tablet (40 mg total) by mouth daily. (Patient taking differently: Take 40 mg by mouth daily as needed.) 30 tablet 6  . tamoxifen (NOLVADEX) 20 MG tablet Take 1 tablet (20 mg total) by mouth daily. 90 tablet 3  . verapamil (CALAN SR) 180 MG CR tablet Take 1 tablet (180 mg total) by mouth daily. In the morning. 90 tablet 1   No current facility-administered medications for this visit.    PHYSICAL EXAMINATION: ECOG PERFORMANCE STATUS: 1 - Symptomatic but completely ambulatory  Vitals:   01/27/21 0821  BP: 137/81  Pulse: 81  Resp: 20  Temp: (!) 97.5 F (36.4 C)  SpO2: 100%   Filed Weights   01/27/21 0821  Weight: 137 lb 6.4 oz (62.3 kg)    BREAST: No palpable masses or nodules in either right or left breasts. No palpable axillary supraclavicular or infraclavicular adenopathy no breast tenderness or nipple discharge. (exam performed in the presence of a chaperone)  LABORATORY DATA:  I have reviewed the data as listed CMP Latest Ref Rng & Units 01/21/2021 02/14/2020 05/04/2018  Glucose 70 - 99 mg/dL 97 107(H) 110(H)  BUN 6 - 20 mg/dL _0 Creatinine 0.44 - 1.00 mg/dL 0.84 0.77 0.65  Sodium 135 - 145 mmol/L 138 140 136  Potassium 3.5 - 5.1 mmol/L 3.5 3.8 3.7  Chloride 98 - 111 mmol/L 103 106 102  CO2 22  - 32 mmol/L 21(L) 27 28  Calcium 8.9 - 10.3 mg/dL 9.3 8.9 9.5  Total Protein 6.5 - 8.1 g/dL 7.4 7.3 7.3  Total Bilirubin 0.3 - 1.2 mg/dL 0.3 <0.2(L) 0.3  Alkaline Phos 38 - 126 U/L 63 81 54  AST 15 - 41 U/L 18 12(L) 14  ALT 0 - 44 U/L _1 Lab Results  Component Value Date   WBC 4.7 01/21/2021   HGB 11.7 (L) 01/21/2021   HCT 36.4 01/21/2021   MCV 83.3 01/21/2021   PLT 290 01/21/2021   NEUTROABS 2.1 02/14/2020    ASSESSMENT & PLAN:  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:Screening mammogram detected left breast calcifications, 2.9cm. 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 Adjuvant radiation therapy completed 06/06/2020  Current treatment: antiestrogen therapy with tamoxifen 5 years started July 2021 Tamoxifen toxicities: 1.  Hot flashes and mood swings: I recommended that we start her Effexor.  She is  very happy to hear that it could help both the symptoms.  CT chest March 2022: 3 mm lung nodule: I discussed with her that it appears to be benign and we will obtain another CT chest with contrast in 6 months and follow-up after that. Regarding the intramammary lymph node I recommended that she immediately get mammogram performed.  A lot of times after radiation the CT scans may show abnormalities which can only be evaluated properly with her mammogram with or without an ultrasound.  Breast cancer surveillance: 1.  Breast exam 01/27/2021: Benign 2. Mammogram will need to be performed.  Return to clinic in 6 months after CT chest and follow-up.    No orders of the defined types were placed in this encounter.  The patient has a good understanding of the overall plan. she agrees with it. she will call with any problems that may develop before the next visit  here.  Total time spent: 30 mins including face to face time and time spent for planning, charting and coordination of care  Rulon Eisenmenger, MD, MPH 01/27/2021  I, Molly Dorshimer, am acting as scribe for Dr. Nicholas Lose.  I have reviewed the above documentation for accuracy and completeness, and I agree with the above.

## 2021-01-27 ENCOUNTER — Ambulatory Visit (INDEPENDENT_AMBULATORY_CARE_PROVIDER_SITE_OTHER): Payer: No Typology Code available for payment source | Admitting: Family Medicine

## 2021-01-27 ENCOUNTER — Other Ambulatory Visit: Payer: Self-pay | Admitting: Family Medicine

## 2021-01-27 ENCOUNTER — Encounter: Payer: Self-pay | Admitting: Family Medicine

## 2021-01-27 ENCOUNTER — Encounter (INDEPENDENT_AMBULATORY_CARE_PROVIDER_SITE_OTHER): Payer: Self-pay

## 2021-01-27 ENCOUNTER — Other Ambulatory Visit: Payer: Self-pay

## 2021-01-27 ENCOUNTER — Inpatient Hospital Stay: Payer: No Typology Code available for payment source | Attending: Adult Health | Admitting: Hematology and Oncology

## 2021-01-27 VITALS — BP 122/60 | HR 67 | Temp 98.3°F | Ht 63.0 in | Wt 136.6 lb

## 2021-01-27 VITALS — BP 137/81 | HR 81 | Temp 97.5°F | Resp 20 | Ht 63.0 in | Wt 137.4 lb

## 2021-01-27 DIAGNOSIS — D0512 Intraductal carcinoma in situ of left breast: Secondary | ICD-10-CM | POA: Diagnosis present

## 2021-01-27 DIAGNOSIS — R911 Solitary pulmonary nodule: Secondary | ICD-10-CM | POA: Diagnosis not present

## 2021-01-27 DIAGNOSIS — Z7951 Long term (current) use of inhaled steroids: Secondary | ICD-10-CM | POA: Diagnosis not present

## 2021-01-27 DIAGNOSIS — I10 Essential (primary) hypertension: Secondary | ICD-10-CM

## 2021-01-27 DIAGNOSIS — M940 Chondrocostal junction syndrome [Tietze]: Secondary | ICD-10-CM

## 2021-01-27 DIAGNOSIS — Z79810 Long term (current) use of selective estrogen receptor modulators (SERMs): Secondary | ICD-10-CM | POA: Diagnosis not present

## 2021-01-27 DIAGNOSIS — Z923 Personal history of irradiation: Secondary | ICD-10-CM | POA: Insufficient documentation

## 2021-01-27 DIAGNOSIS — Z79899 Other long term (current) drug therapy: Secondary | ICD-10-CM | POA: Diagnosis not present

## 2021-01-27 DIAGNOSIS — Z7952 Long term (current) use of systemic steroids: Secondary | ICD-10-CM | POA: Diagnosis not present

## 2021-01-27 DIAGNOSIS — F4323 Adjustment disorder with mixed anxiety and depressed mood: Secondary | ICD-10-CM

## 2021-01-27 MED ORDER — VENLAFAXINE HCL ER 37.5 MG PO CP24: 37.5000 mg | ORAL_CAPSULE | Freq: Every day | ORAL | 11 refills | Status: DC

## 2021-01-27 MED ORDER — IBUPROFEN 600 MG PO TABS: 600.0000 mg | ORAL_TABLET | Freq: Three times a day (TID) | ORAL | 1 refills | Status: DC | PRN

## 2021-01-27 MED FILL — VENLAFAXINE HCL ER 37.5 MG: 37.5 | 30 days supply | Qty: 30 | Fill #0

## 2021-01-27 MED FILL — IBUPROFEN 600 MG TABLET: 600 | 10 days supply | Qty: 30 | Fill #0

## 2021-01-27 MED FILL — TAMOXIFEN 20 MG TABLET: 20 | 90 days supply | Qty: 90 | Fill #2

## 2021-01-27 NOTE — Progress Notes (Signed)
Patient: Victoria Maddox MRN: [id number] DOB: Mar 02, 1976 PCP: Orma Flaming, MD     Subjective:  Chief Complaint  Patient presents with  . Transitions Of Care  . Hypertension  . Chest Pain  . Depression    HPI: The patient is a 45 y.o. female who presents today for TOC. She complains of chest pain and pressure since yesterday. She also has depression and hot flashes that she just received effexor for from her oncologist.   Hypertension: Here for follow up of hypertension.  Currently on verapamil 180mg /daily.  Takes medication as prescribed and denies any side effects. Exercise includes none. Weight has been stable. Denies any chest pain, headaches, shortness of breath, vision changes, swelling in lower extremities.   Depression Currently on effexor given to her by her oncologist. She just got this today and she has not even taken it. Her depression is worse and it's affecting her kids. She has a past medical history of anxiety and depression. Her husband is living in a different state as well so that is a big stressor.   Atypical chest pain in her rib area Seen in ER on 01/21/21. Pain started last Monday. She was at work and started to have sharp pain, it went away and when she went to grab her purse with her left arm it came back and it didn't stop so she wen to ER next day. Pain was over sternum, but now it's under right breast pain on her ribs. Pain was originally sharp in nature, but has been dull since last week. She has taken tylenol and it didn't help much. No pain with movement, but she does have pain with deep inspiration. Pain is constant and rated as a 6/10. No pain with exertion, but she does have pain with pressing on rib in area. NO RUQ pain, no nausea/vomiting.   ER visit on 01/21/21 -lab work non impressive except for d-dimer of .70. CTA negative for PE. Incidental finding of 82mm right upper lobe noduel as well as .7 cm round soft tissue density within the left breast.  Likely an intrammary lymph node. CXR normal. Troponin negative x 2.   Review of Systems  Constitutional: Negative for chills, fatigue and fever.  HENT: Negative for dental problem, ear pain, hearing loss, nosebleeds and trouble swallowing.   Eyes: Negative for visual disturbance.  Respiratory: Negative for cough, chest tightness and shortness of breath.   Cardiovascular: Positive for chest pain. Negative for palpitations and leg swelling.  Gastrointestinal: Negative for abdominal pain, blood in stool, diarrhea and nausea.  Endocrine: Negative for cold intolerance, polydipsia, polyphagia and polyuria.  Genitourinary: Negative for dysuria and hematuria.  Musculoskeletal: Negative for arthralgias.  Skin: Negative for rash.  Neurological: Negative for dizziness and headaches.  Psychiatric/Behavioral: Negative for dysphoric mood and sleep disturbance. The patient is not nervous/anxious.     Allergies Patient is allergic to trazodone and nefazodone and zoloft [sertraline hcl].  Past Medical History Patient  has a past medical history of Allergy, Depression, Family history of breast cancer, Family history of kidney cancer, Family history of throat cancer, Frequent headaches, Heart murmur, History of recurrent UTIs, Hypertension, Migraines, and Urinary incontinence.  Surgical History Patient  has a past surgical history that includes Dilation and curettage of uterus and Breast lumpectomy with radioactive seed localization (Left, 03/12/2020).  Family History Pateint's family history includes Arthritis in her brother; Asthma in her sister; Breast cancer in her cousin; Breast cancer (age of onset: 13) in her cousin;  Diabetes in her brother and brother; Early death in her father; Heart attack in her mother; Heart disease in her mother and sister; Hyperlipidemia in her brother; Hypertension in her brother, brother, brother, father, mother, sister, sister, and sister; Kidney cancer (age of onset: 34) in  an other family member; Mental illness in her brother; Throat cancer (age of onset: 5) in her sister.  Social History Patient  reports that she has never smoked. She has never used smokeless tobacco. She reports previous alcohol use. She reports that she does not use drugs.    Objective: Vitals:   01/27/21 0955  BP: 122/60  Pulse: 67  Temp: 98.3 F (36.8 C)  TempSrc: Temporal  SpO2: 99%  Weight: 136 lb 9.6 oz (62 kg)  Height: 5\' 3"  (1.6 m)    Body mass index is 24.2 kg/m.  Physical Exam Vitals reviewed.  Constitutional:      Appearance: She is well-developed and normal weight.  HENT:     Head: Normocephalic and atraumatic.     Right Ear: External ear normal.     Left Ear: External ear normal.  Eyes:     Extraocular Movements: Extraocular movements intact.     Conjunctiva/sclera: Conjunctivae normal.     Pupils: Pupils are equal, round, and reactive to light.  Neck:     Thyroid: No thyromegaly.  Cardiovascular:     Rate and Rhythm: Normal rate and regular rhythm.     Heart sounds: Normal heart sounds. No murmur heard.   Pulmonary:     Effort: Pulmonary effort is normal.     Breath sounds: Normal breath sounds.  Chest:     Chest wall: Tenderness (over right rib. TTP) present.  Abdominal:     General: Bowel sounds are normal. There is no distension.     Palpations: Abdomen is soft.     Tenderness: There is no abdominal tenderness.  Musculoskeletal:     Cervical back: Normal range of motion and neck supple.  Lymphadenopathy:     Cervical: No cervical adenopathy.  Skin:    General: Skin is warm and dry.     Capillary Refill: Capillary refill takes less than 2 seconds.     Findings: No rash.  Neurological:     General: No focal deficit present.     Mental Status: She is alert and oriented to person, place, and time.     Cranial Nerves: No cranial nerve deficit.     Coordination: Coordination normal.     Deep Tendon Reflexes: Reflexes normal.  Psychiatric:         Mood and Affect: Mood normal.        Behavior: Behavior normal.     Comments: No si/hi/ah/vh     Flowsheet Row Office Visit from 01/27/2021 in [facility]  PHQ-9 Total Score 6     All ER notes/labs/imaging reviewed.      Assessment/plan: 1. Adjustment disorder with mixed anxiety and depressed mood effexor just started today. phq9 score is mild. Will have her f/u in 3 months to see how she is doing. May need small titration. Hoping to help with the hot flashes as well. Any si/hi needs to go to ER or call 911.   2. Primary hypertension Blood pressure is to goal. Continue current anti-hypertensive medications. Recommended routine exercise and healthy diet including DASH diet and mediterranean diet. Encouraged weight loss. F/u in 3 months for routine fasting labs work.      3. Costochondritis Handout  given. Discussed ice or heat, voltaren gel and ibuprofen prn. Reassurance given. Let me know If not getting better or change in symptoms.   Oncology has ordered mmg and is following up on CT.      This visit occurred during the SARS-CoV-2 public health emergency.  Safety protocols were in place, including screening questions prior to the visit, additional usage of staff PPE, and extensive cleaning of exam room while observing appropriate contact time as indicated for disinfecting solutions.    Return in about 3 months (around 04/29/2021) for annual/fasting labs/depression f/u .   Orma Flaming, MD [place]   01/27/2021

## 2021-01-27 NOTE — Patient Instructions (Signed)
-can do ice or heat -ibuprofen as needed -I also like voltaren gel  1)see you back in 3 months for annual ( come fasting) 2) f/u on depression 3) let me know if not getting better.   Costochondritis  Costochondritis is irritation and swelling (inflammation) of the tissue that connects the ribs to the breastbone (sternum). This tissue is called cartilage. Costochondritis causes pain in the front of the chest. Usually, the pain:  Starts slowly.  Is in more than one rib. What are the causes? The exact cause of this condition is not always known. It results from stress on the tissue in the affected area. The cause of this stress could be:  Chest injury.  Exercise or activity, such as lifting.  Very bad coughing. What increases the risk? You are more likely to develop this condition if you:  Are female.  Are 47-31 years old.  Recently started a new exercise or work activity.  Have low levels of vitamin D.  Have a condition that makes you cough often. What are the signs or symptoms? The main symptom of this condition is chest pain. The pain:  Usually starts slowly and can be sharp or dull.  Gets worse with deep breathing, coughing, or exercise.  Gets better with rest.  May be worse when you press on the affected area of your ribs and breastbone. How is this treated? This condition usually goes away on its own over time. Your doctor may prescribe an NSAID, such as ibuprofen. This can help reduce pain and inflammation. Treatment may also include:  Resting and avoiding activities that make pain worse.  Putting heat or ice on the painful area.  Doing exercises to stretch your chest muscles. If these treatments do not help, your doctor may inject a numbing medicine to help relieve the pain. Follow these instructions at home: Managing pain, stiffness, and swelling  If told, put ice on the painful area. To do this: ? Put ice in a plastic bag. ? Place a towel between  your skin and the bag. ? Leave the ice on for 20 minutes, 2-3 times a day.  If told, put heat on the affected area. Do this as often as told by your doctor. Use the heat source that your doctor recommends, such as a moist heat pack or a heating pad. ? Place a towel between your skin and the heat source. ? Leave the heat on for 20-30 minutes. ? Take off the heat if your skin turns bright red. This is very important if you cannot feel pain, heat, or cold. You may have a greater risk of getting burned.      Activity  Rest as told by your doctor.  Do not do anything that makes your pain worse. This includes any activities that use chest, belly (abdomen), and side muscles.  Do not lift anything that is heavier than 10 lb (4.5 kg), or the limit that you are told, until your doctor says that it is safe.  Return to your normal activities as told by your doctor. Ask your doctor what activities are safe for you. General instructions  Take over-the-counter and prescription medicines only as told by your doctor.  Keep all follow-up visits as told by your doctor. This is important. Contact a doctor if:  You have chills or a fever.  Your pain does not go away or it gets worse.  You have a cough that does not go away. Get help right away if:  You are short of breath.  You have very bad chest pain that is not helped by medicines, heat, or ice. These symptoms may be an emergency. Do not wait to see if the symptoms will go away. Get medical help right away. Call your local emergency services (911 in the [place] Do not drive yourself to the hospital. Summary  Costochondritis is irritation and swelling (inflammation) of the tissue that connects the ribs to the breastbone (sternum).  This condition causes pain in the front of the chest.  Treatment may include medicines, rest, heat or ice, and exercises. This information is not intended to replace advice given to you by your health care provider.  Make sure you discuss any questions you have with your health care provider. Document Revised: 09/08/2019 Document Reviewed: 09/08/2019 Elsevier Patient Education  2021 [employer].

## 2021-01-27 NOTE — Assessment & Plan Note (Addendum)
02/08/2020:Screening mammogram detected left breast calcifications, 2.9cm. 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 Adjuvant radiation therapy completed 06/06/2020  Current treatment: antiestrogen therapy with tamoxifen 5 years started July 2021 Tamoxifen toxicities:  Breast cancer surveillance: 1.  Breast exam 01/27/2021: Benign 2. Mammogram will need to be performed.  Return to clinic in 1 year for follow-up

## 2021-01-28 ENCOUNTER — Telehealth: Payer: Self-pay | Admitting: Hematology and Oncology

## 2021-01-28 ENCOUNTER — Other Ambulatory Visit: Payer: Self-pay | Admitting: Family Medicine

## 2021-01-28 MED FILL — VERAPAMIL HCL ER 180 MG TBC: 180 | 90 days supply | Qty: 90 | Fill #0

## 2021-01-28 NOTE — Telephone Encounter (Signed)
Scheduled per 3/21 los. Pt will receive an updated appt calendar per next visit appt notes

## 2021-02-07 ENCOUNTER — Other Ambulatory Visit: Payer: Self-pay | Admitting: General Surgery

## 2021-02-07 DIAGNOSIS — N6320 Unspecified lump in the left breast, unspecified quadrant: Secondary | ICD-10-CM

## 2021-02-10 ENCOUNTER — Other Ambulatory Visit: Payer: Self-pay

## 2021-02-10 ENCOUNTER — Ambulatory Visit
Admission: RE | Admit: 2021-02-10 | Discharge: 2021-02-10 | Disposition: A | Discharge status: Home / Self Care | Payer: No Typology Code available for payment source | Source: Ambulatory Visit | Attending: Adult Health | Admitting: Adult Health

## 2021-02-10 ENCOUNTER — Telehealth: Payer: Self-pay | Admitting: Hematology and Oncology

## 2021-02-10 ENCOUNTER — Ambulatory Visit
Admission: RE | Admit: 2021-02-10 | Discharge: 2021-02-10 | Disposition: A | Discharge status: Home / Self Care | Payer: No Typology Code available for payment source | Source: Ambulatory Visit | Attending: General Surgery | Admitting: General Surgery

## 2021-02-10 ENCOUNTER — Other Ambulatory Visit: Payer: Self-pay | Admitting: Adult Health

## 2021-02-10 DIAGNOSIS — N6320 Unspecified lump in the left breast, unspecified quadrant: Secondary | ICD-10-CM

## 2021-02-10 DIAGNOSIS — D0512 Intraductal carcinoma in situ of left breast: Secondary | ICD-10-CM

## 2021-02-10 DIAGNOSIS — R921 Mammographic calcification found on diagnostic imaging of breast: Secondary | ICD-10-CM

## 2021-02-10 HISTORY — DX: Personal history of irradiation: Z92.3

## 2021-02-10 IMAGING — US US BREAST*L* LIMITED INC AXILLA
2 series · 13 of 25 positions shown · non-contrast
Comparison: Bilateral diagnostic mammogram obtained earlier today
and chest CTA dated [DATE]. Bilateral breast MRI dated
[DATE].

CLINICAL DATA: 7 mm mass in the posterior central left breast on a
recent chest CTA.

EXAM:
ULTRASOUND OF THE LEFT BREAST

[Series 1: us breast*left* limited inc axilla · 0.05mm/px · 8 of 20 slices shown (1 of 2)]
[im 1/20]
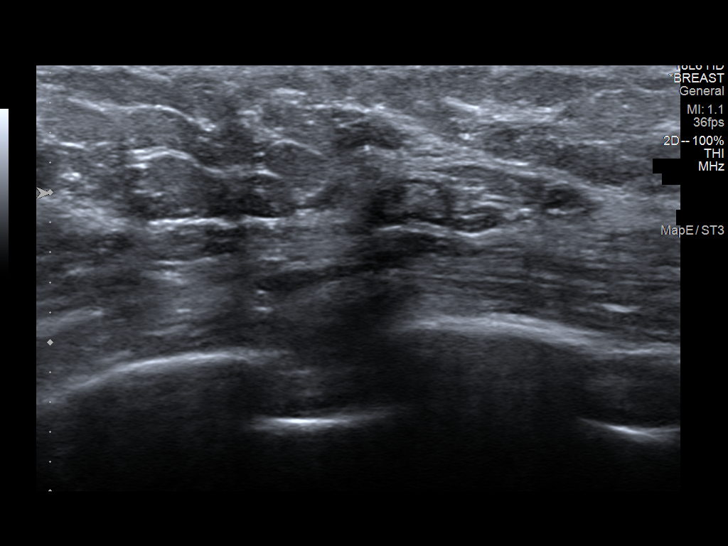
[im 3/20]
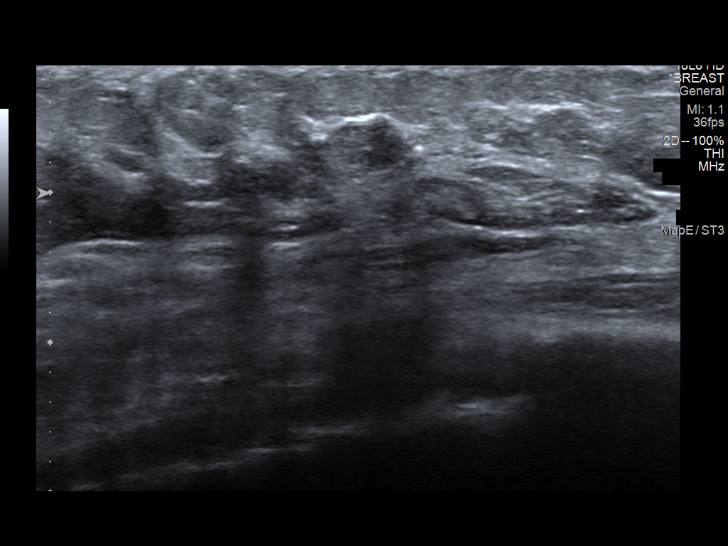
[im 6/20]
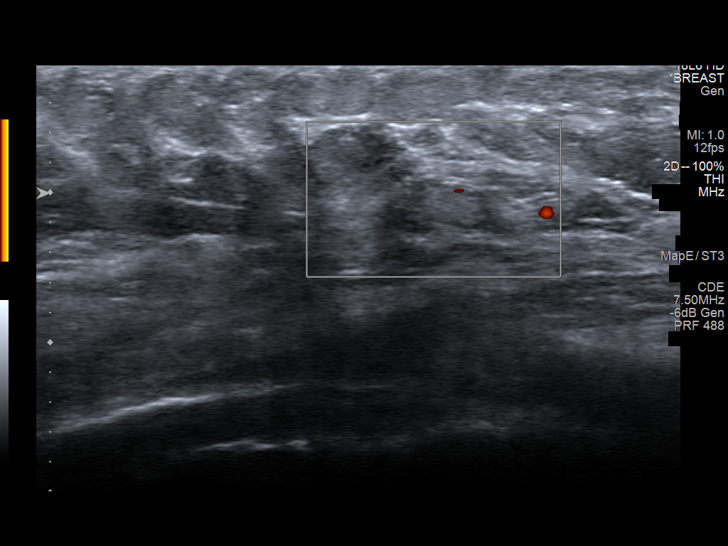
[im 8/20]
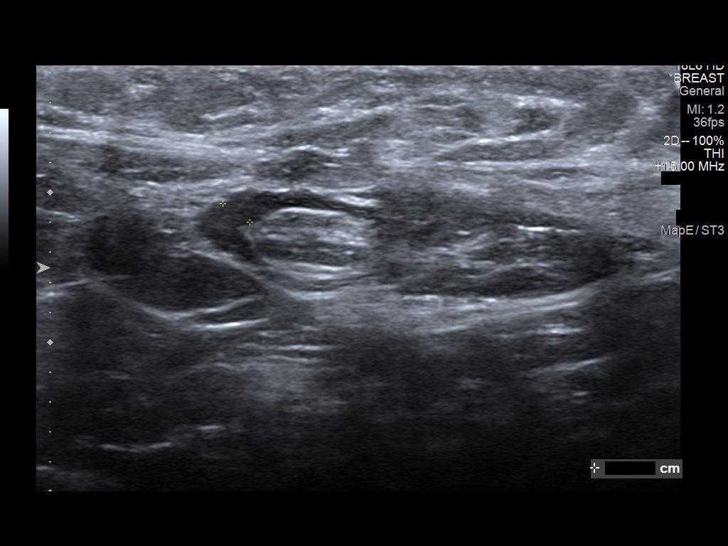
[im 11/20]
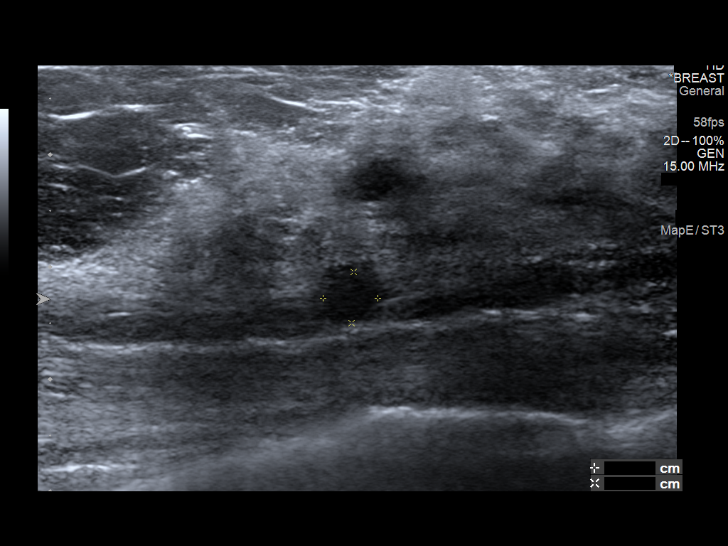
[im 13/20]
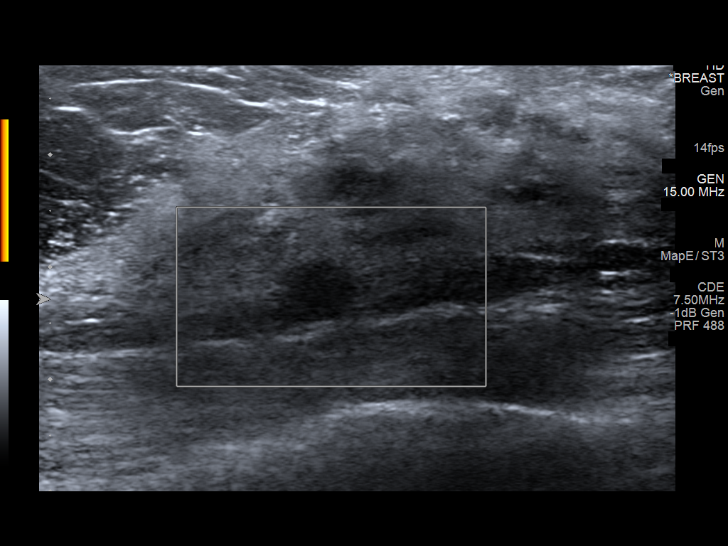
[im 16/20]
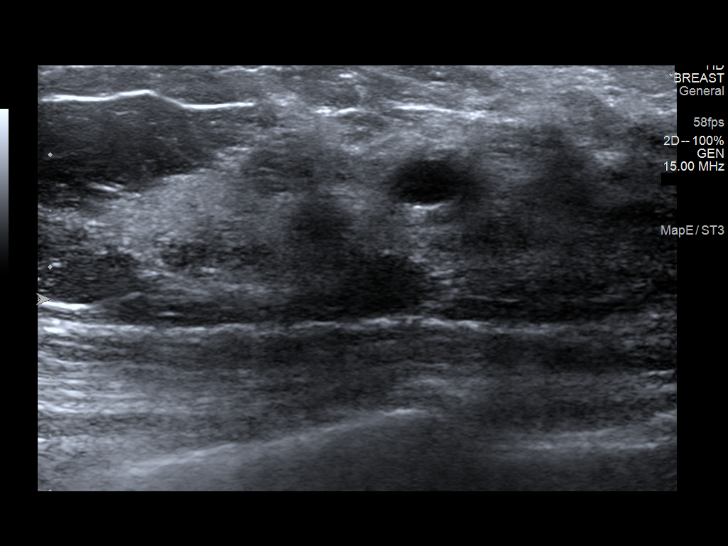
[im 18/20]
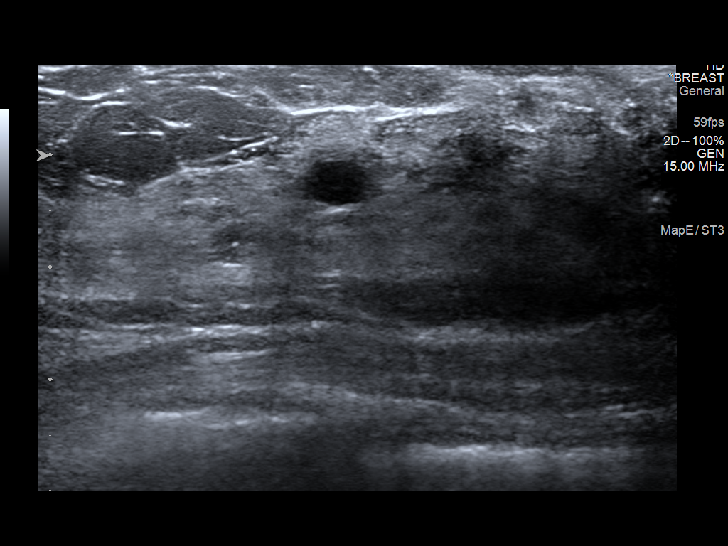

[Series 2: us breast*left* limited inc axilla · 0.06mm/px · 5 of 11 slices shown (2 of 2)]
[im 1/11]
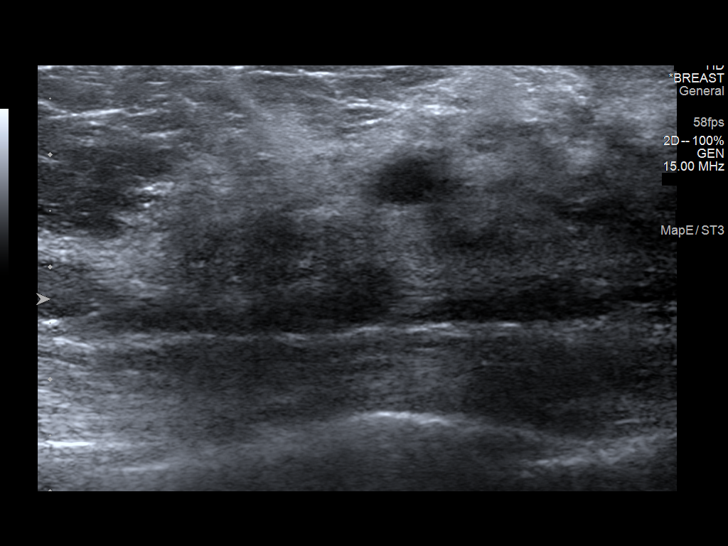
[im 3/11]
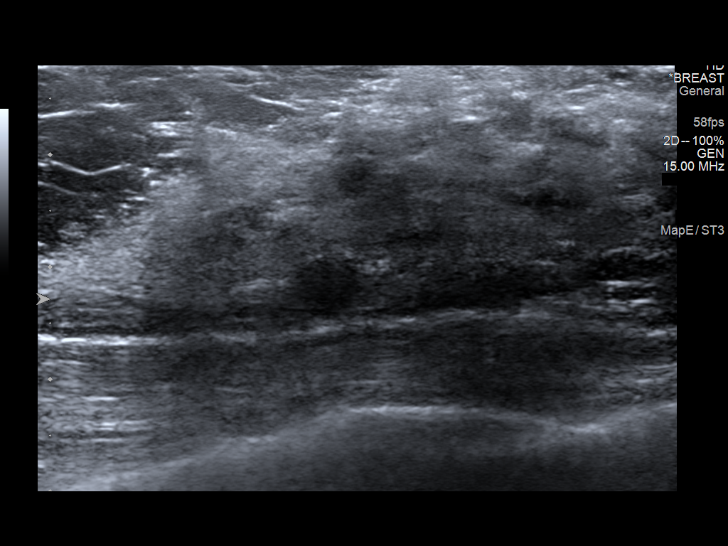
[im 6/11]
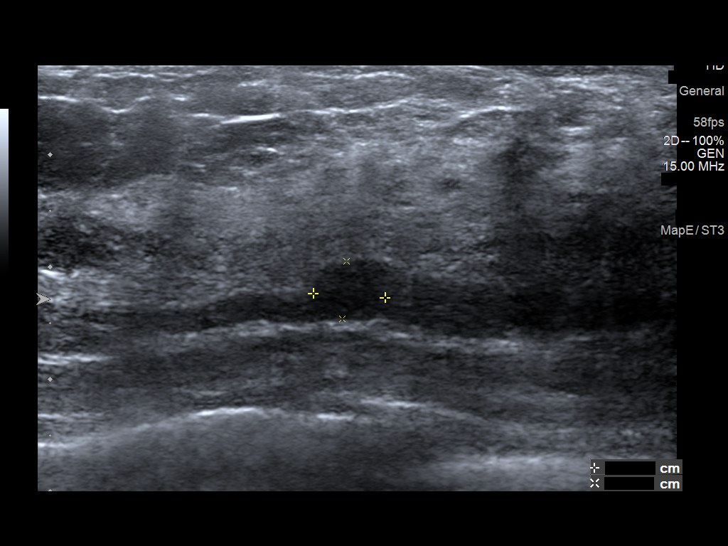
[im 8/11]
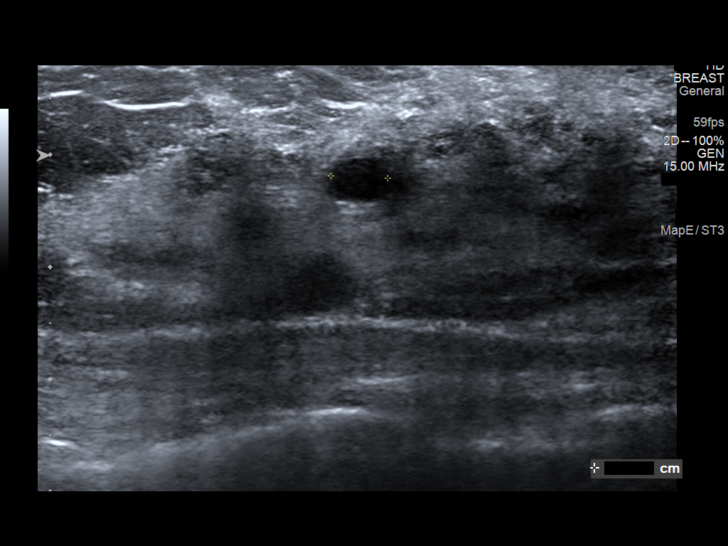
[im 11/11]
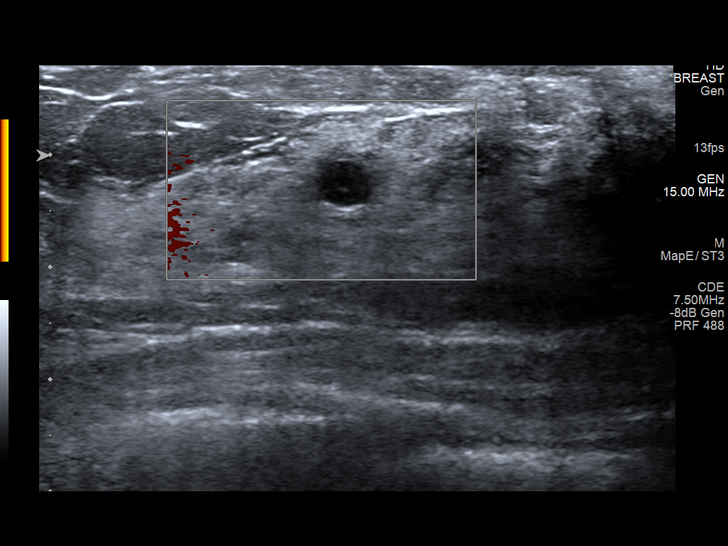

[13 of 25 positions shown; findings below may reference images not displayed]

FINDINGS: Targeted ultrasound is performed, showing a 5 x 4 x 4 mm oval,
horizontally oriented, hypoechoic mass with some circumscribed and
some indistinct margins in the 12 o'clock retroareolar left breast,
middle depth. This exhibits mild increased through transmission of
sound and has no internal blood flow with power Doppler. This
corresponds to a cyst seen on the MRI dated [DATE], unchanged in
size since then.

More posteriorly in the 12 o'clock retroareolar left breast, there
is a 6 x 5 x 5 mm oval, horizontally oriented, hypoechoic mass. This
has predominantly indistinct margins with no increased or decreased
through transmission of sound. No internal blood flow was seen with
power Doppler. This corresponds to the mass seen on the recent chest
CTA. On the mammogram earlier today, this consists of a round,
circumscribed mass unchanged since [DATE]. This corresponds to a
cyst seen on the MRI dated [DATE], unchanged in size since then.
IMPRESSION: Benign left breast cysts, including the mass seen on the recent
chest CTA. No ultrasound evidence of malignancy.

RECOMMENDATION:
Scheduled bilateral stereotactic guided core needle biopsies for
bilateral indeterminate calcifications seen at diagnostic
mammography earlier today.

I have discussed the findings and recommendations with the patient.
If applicable, a reminder letter will be sent to the patient
regarding the next appointment.

BI-RADS CATEGORY  2: Benign.

## 2021-02-10 IMAGING — MG DIGITAL DIAGNOSTIC BILAT W/ TOMO W/ CAD
8 of 18 series · 8 of 34 positions shown · non-contrast
Comparison: Previous exam(s).

CLINICAL DATA: Status post left lumpectomy in [DATE] for DCIS,
flat epithelial atypia and radial scars presenting as
calcifications, followed by radiation therapy.

EXAM:
DIGITAL DIAGNOSTIC BILATERAL MAMMOGRAM WITH TOMOSYNTHESIS AND CAD
TECHNIQUE: Bilateral digital diagnostic mammography and breast tomosynthesis
was performed. The images were evaluated with computer-aided
detection.

[R ML (1 of 4)]
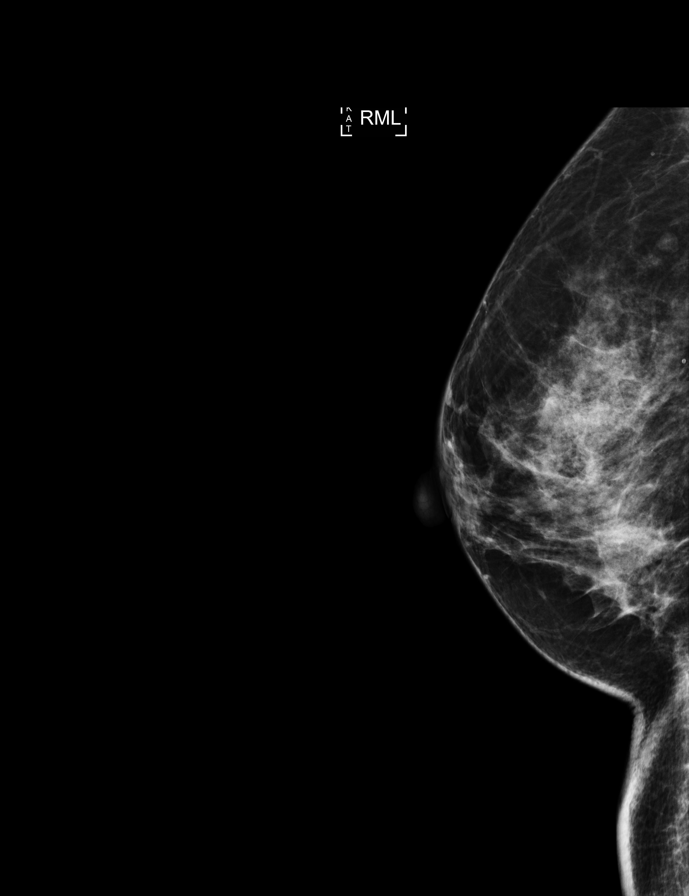

[L MLO]
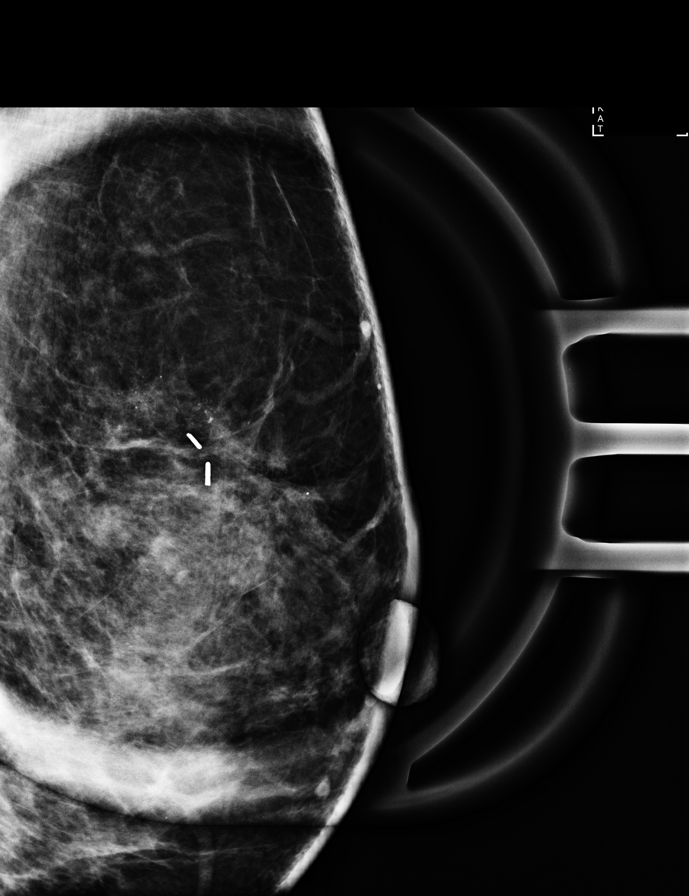

[R ML (2 of 4)]
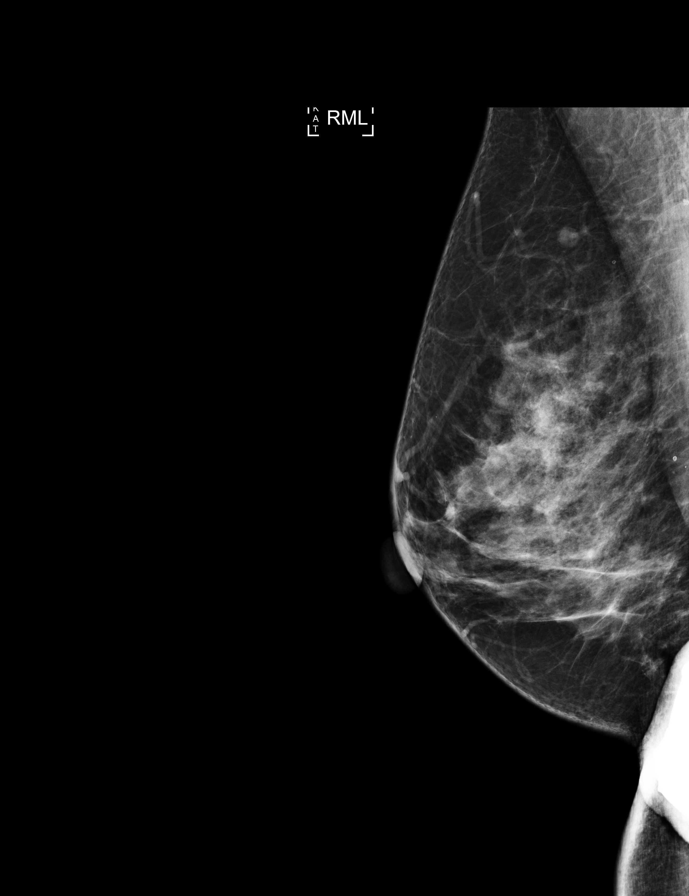

[R ML (3 of 4)]
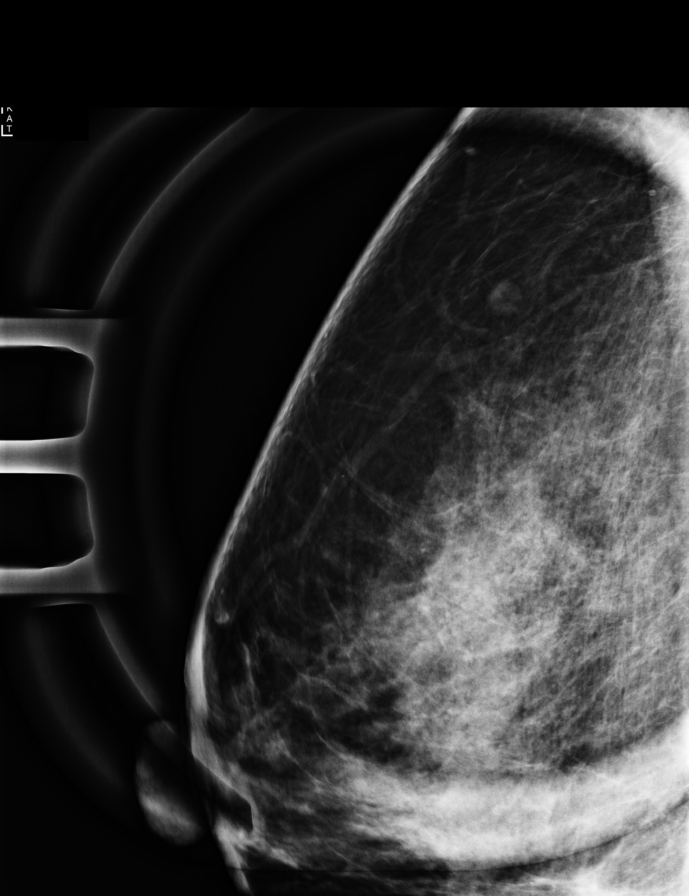

[L CC]
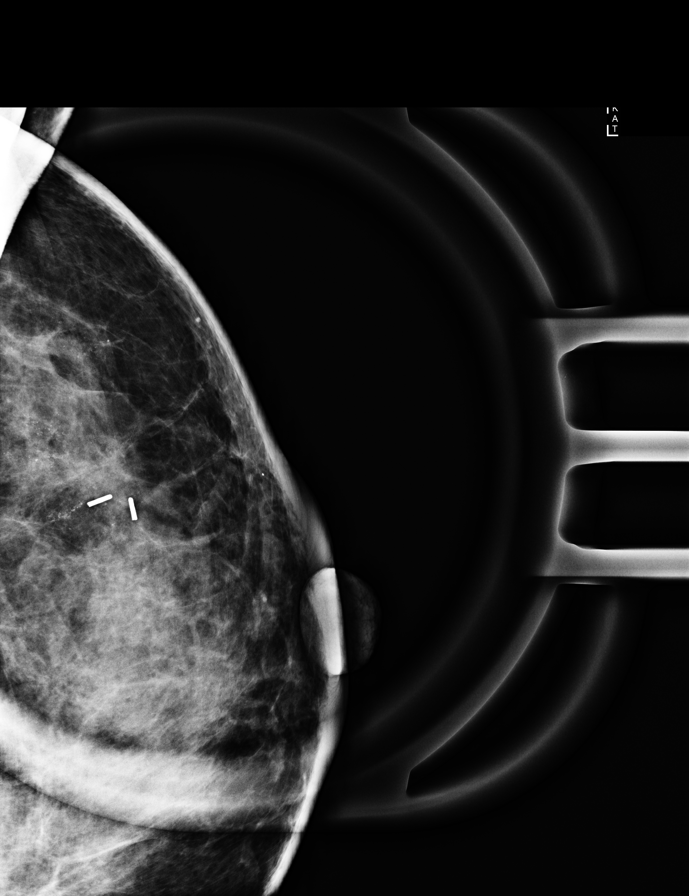

[L ML (1 of 2)]
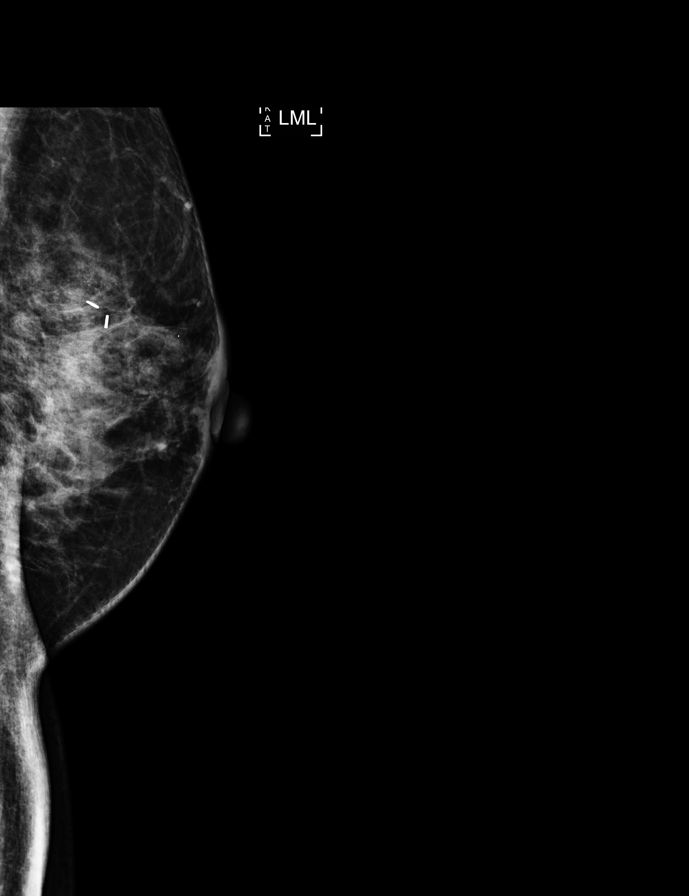

[R ML (4 of 4)]
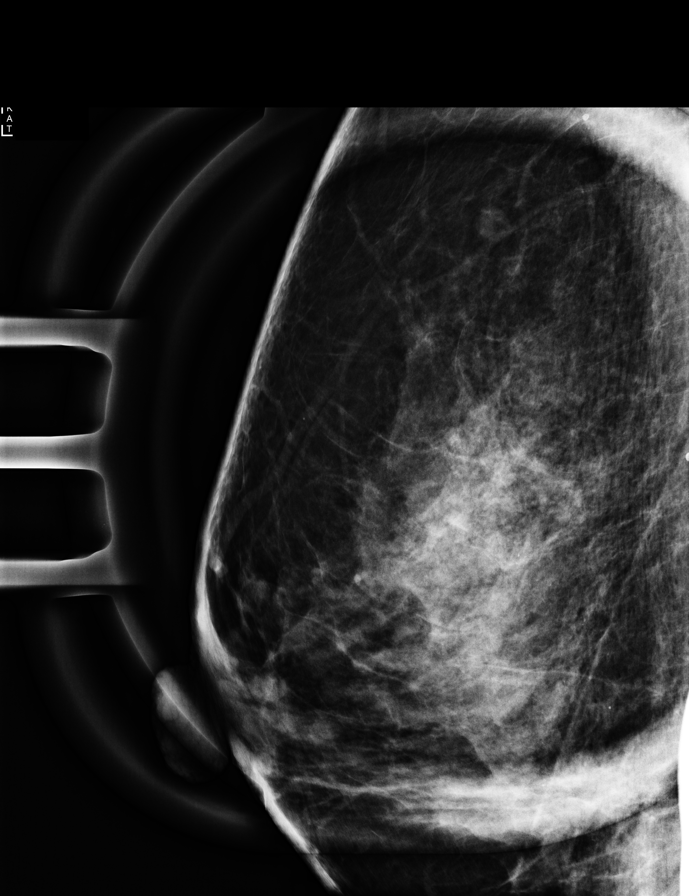

[L ML (2 of 2)]
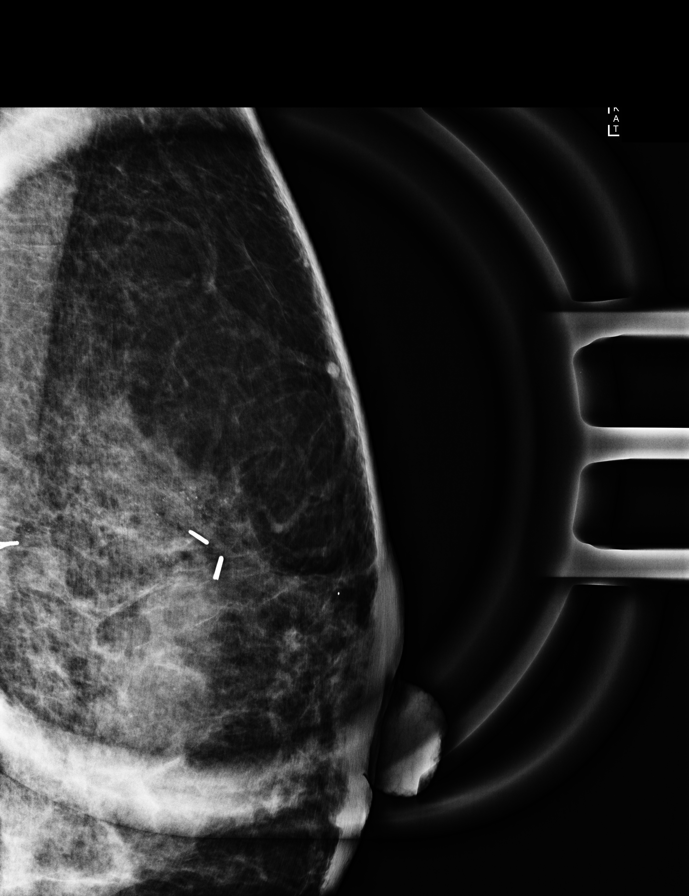

[8 of 34 positions shown; findings below may reference images not displayed]

ACR Breast Density Category c: The breast tissue is heterogeneously
dense, which may obscure small masses.
FINDINGS: Interval post lumpectomy changes in the upper outer left breast. At
and adjacent to the lumpectomy site, there are a large number of
heterogeneous, amorphous calcifications spanning 2.9 x 2.0 x 1.5 cm.
These are similar to the previously biopsied and excised
calcifications.

There is an interval group of tiny calcifications in the far
posterior aspect of the central right breast. These were difficult
to include and visualize on spot magnification true lateral images.
These are better seen on a spot magnification craniocaudal image. On
that image, these span 1.0 x 0.4 cm.

No interval findings elsewhere in either breast suspicious for
malignancy.
IMPRESSION: 1. 2.9 cm group of calcifications at and adjacent to the lumpectomy
site in the upper outer quadrant of the left breast. These are
suspicious for additional DCIS.
2. Interval 1.0 cm group of indeterminate calcifications in the
posterior central right breast.

RECOMMENDATION:
1. 3D stereotactic guided core needle biopsy of the 2.9 cm group of
calcifications in the upper outer left breast.
2. 3D stereotactic guided core needle biopsy of the 1.0 cm group of
calcifications in the posterior central right breast. This has been
discussed with the patient and the biopsies have been scheduled at

I have discussed the findings and recommendations with the patient.
If applicable, a reminder letter will be sent to the patient
regarding the next appointment.

BI-RADS CATEGORY  4: Suspicious.

## 2021-02-10 NOTE — Telephone Encounter (Signed)
I discussed with her about increasing the Effexor dosage to 2 tablets a day.  She will call us back in a week to tell us if it is any improvement. If there is improvement then we will increase the Effexor dosage to 75 mg daily.

## 2021-02-19 ENCOUNTER — Other Ambulatory Visit: Payer: Self-pay

## 2021-02-19 ENCOUNTER — Ambulatory Visit
Admission: RE | Admit: 2021-02-19 | Discharge: 2021-02-19 | Disposition: A | Discharge status: Home / Self Care | Payer: No Typology Code available for payment source | Source: Ambulatory Visit | Attending: Adult Health | Admitting: Adult Health

## 2021-02-19 DIAGNOSIS — R921 Mammographic calcification found on diagnostic imaging of breast: Secondary | ICD-10-CM

## 2021-02-19 DIAGNOSIS — Z803 Family history of malignant neoplasm of breast: Secondary | ICD-10-CM

## 2021-02-19 IMAGING — MG MM BREAST LOCALIZATION CLIP
4 series · 4 of 12 positions shown · non-contrast
Comparison: Previous exam(s).

CLINICAL DATA: Evaluate COIL clip placement following stereotactic
guided LEFT breast biopsy.

EXAM:
DIAGNOSTIC LEFT MAMMOGRAM POST STEREOTACTIC BIOPSY

[L ML synth-2D]
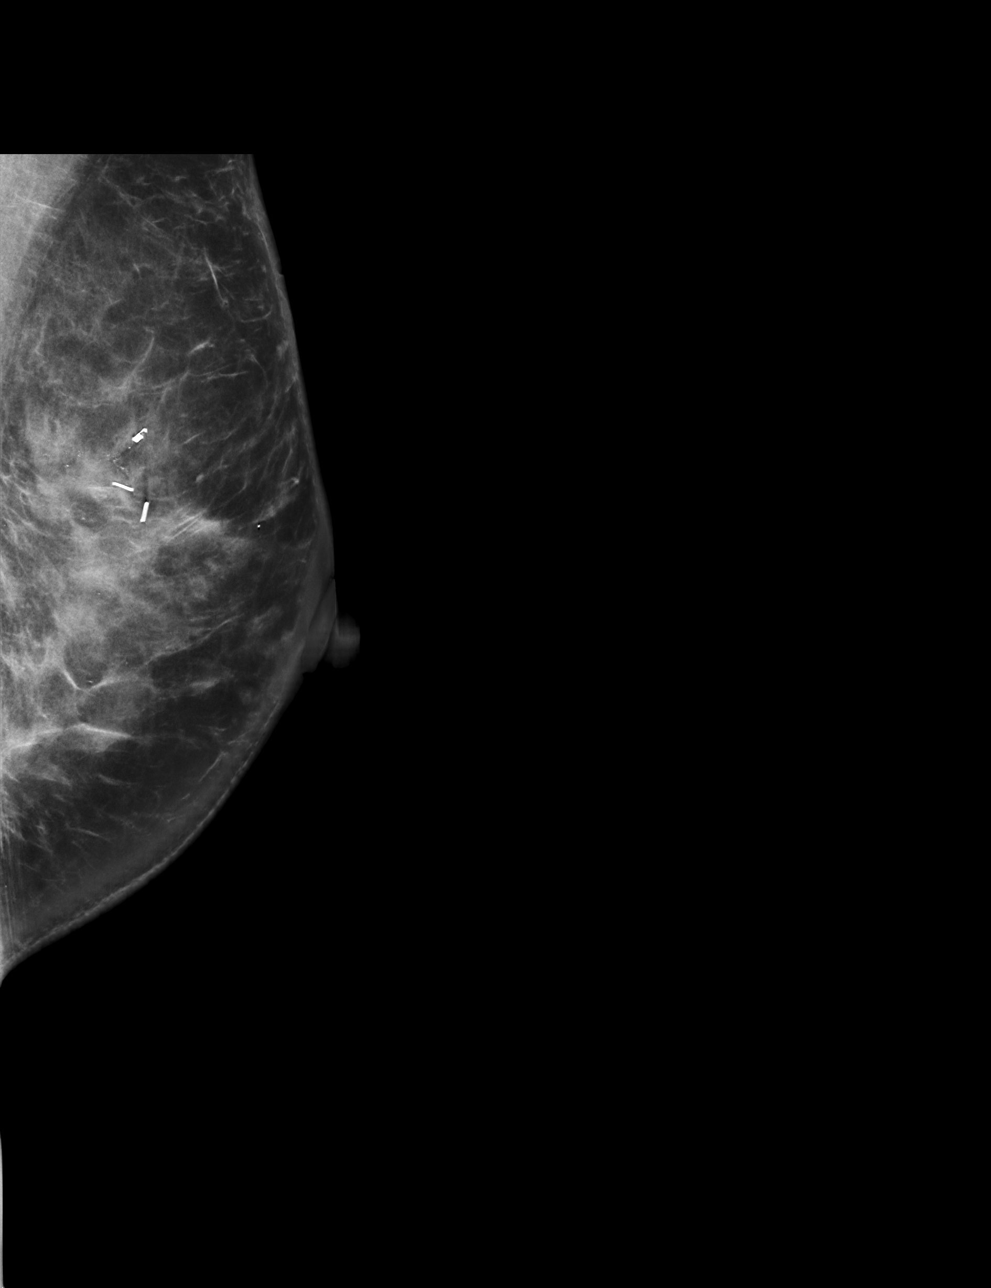

[L CC synth-2D]
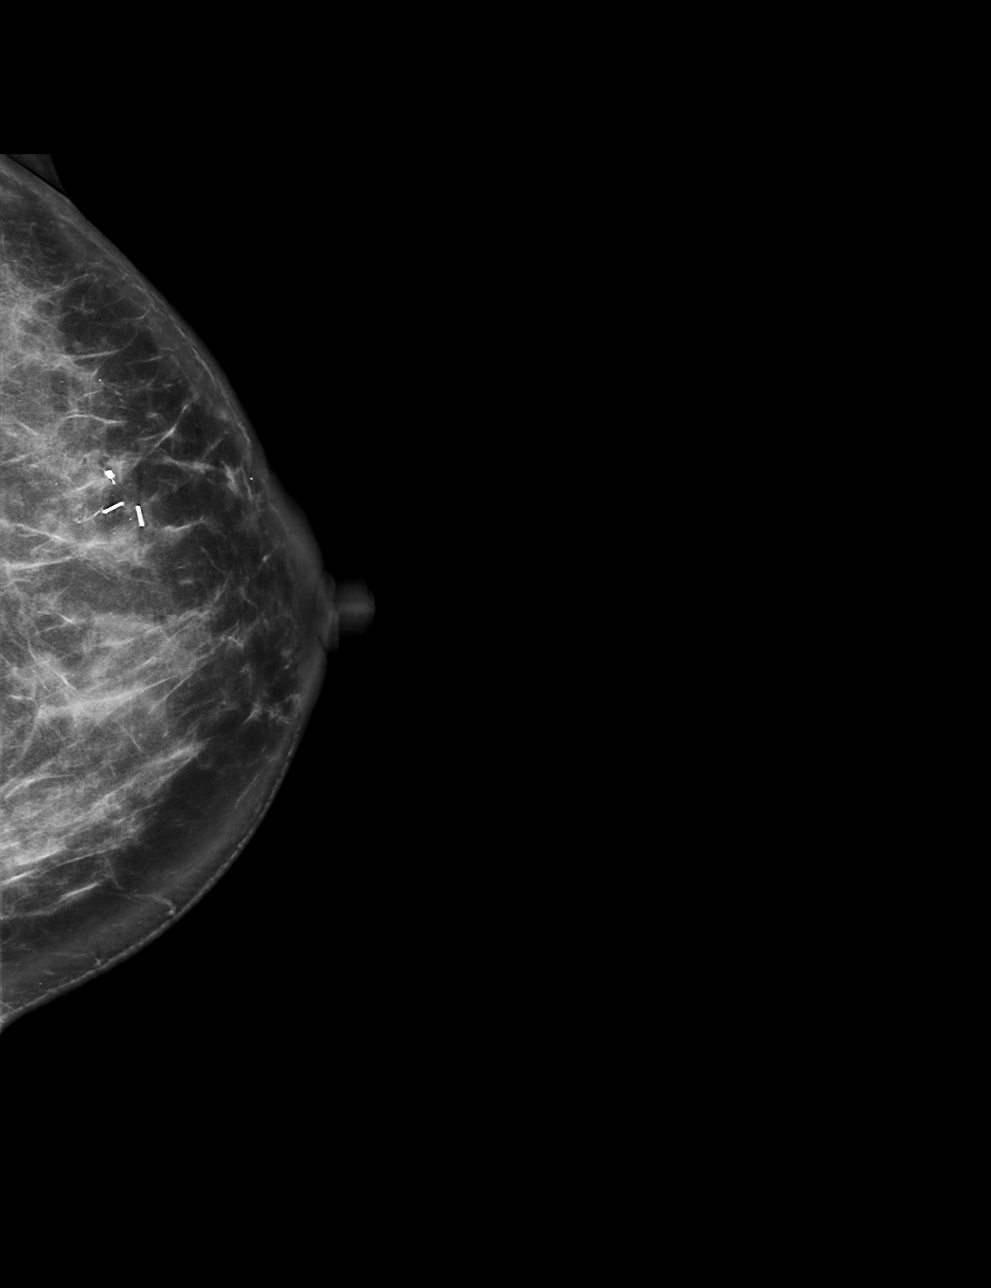

[L ML tomo · tomo slice 43/84.0]
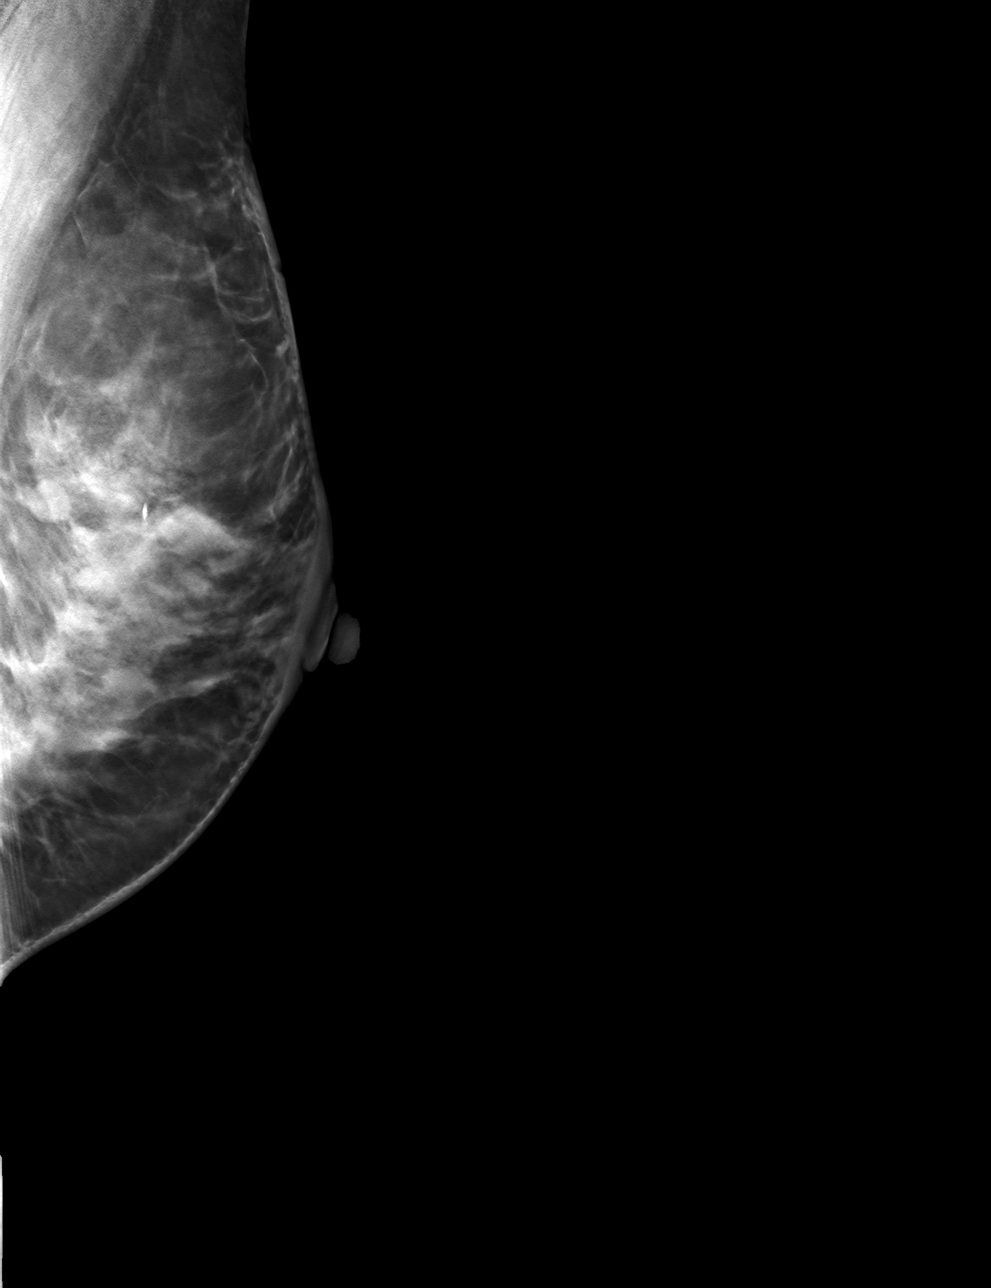

[L CC tomo · tomo slice 39/77.0]
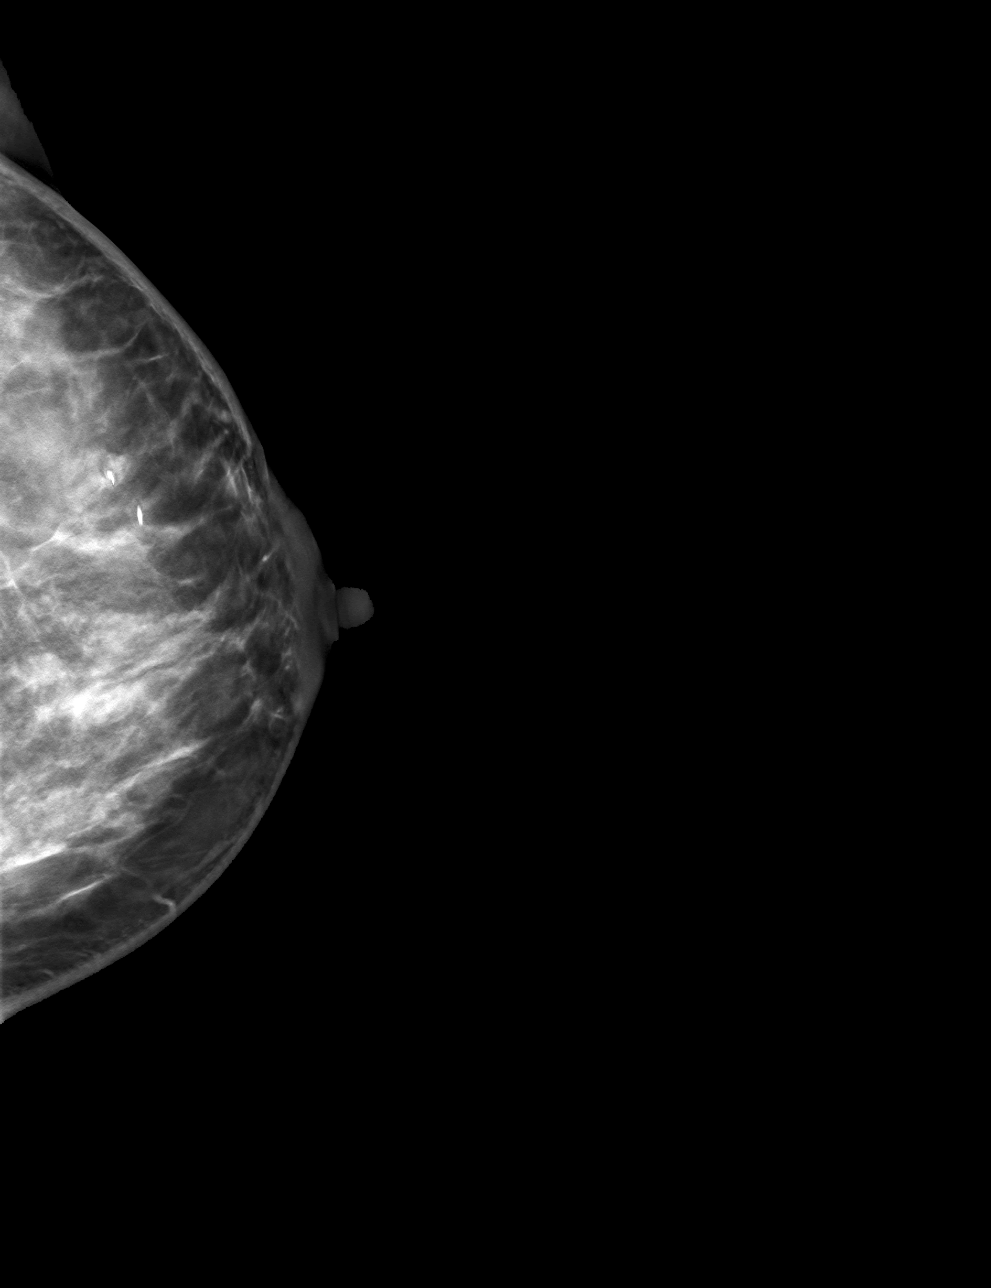

[4 of 12 positions shown; findings below may reference images not displayed]

FINDINGS: Mammographic images were obtained following stereotactic guided
biopsy of calcifications within the UPPER-OUTER LEFT breast. The
COIL biopsy marking clip is in expected position at the site of
biopsy.
IMPRESSION: Appropriate positioning of the COIL shaped biopsy marking clip at
the site of biopsy in the UPPER OUTER LEFT breast. Please note that
remaining calcifications extended least 2 cm posterolateral and 1 cm
medial to the COIL clip.

Final Assessment: Post Procedure Mammograms for Marker Placement

## 2021-02-19 IMAGING — MG MM BREAST BX W LOC DEV 1ST LESION IMAGE BX SPEC STEREO GUIDE*L*
6 of 8 series · 6 of 16 positions shown · non-contrast
Comparison: Previous exams.
COMPARISON: Previous exams.

Addendum:
CLINICAL DATA: 44-year-old female for stereotactic guided biopsy of
UPPER-OUTER LEFT breast calcifications near the lumpectomy site and
stereotactic guided biopsy of posterior central RIGHT breast
calcifications. History of LEFT breast DCIS and lumpectomy in [2E].

On multiple preprocedure images of the RIGHT breast, the
calcifications identified on [DATE] could not be visualized
today. Therefore, the RIGHT breast biopsy was not performed.
EXAM:
LEFT BREAST STEREOTACTIC CORE NEEDLE BIOPSY

[R (1 of 5)]
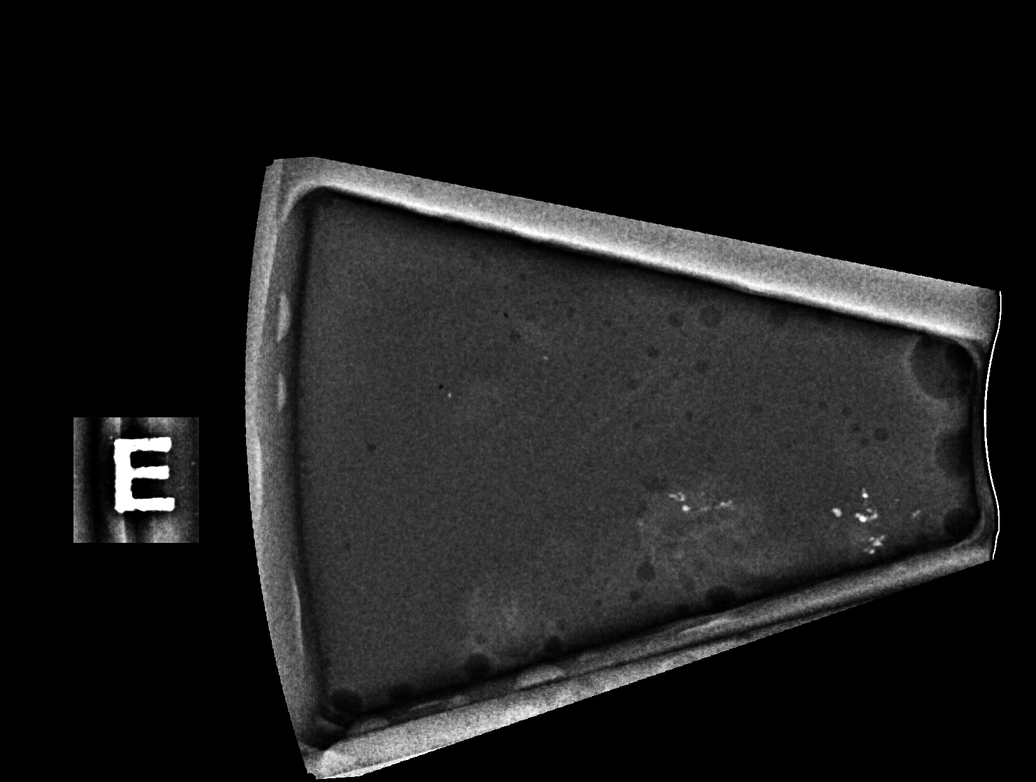

[R (2 of 5)]
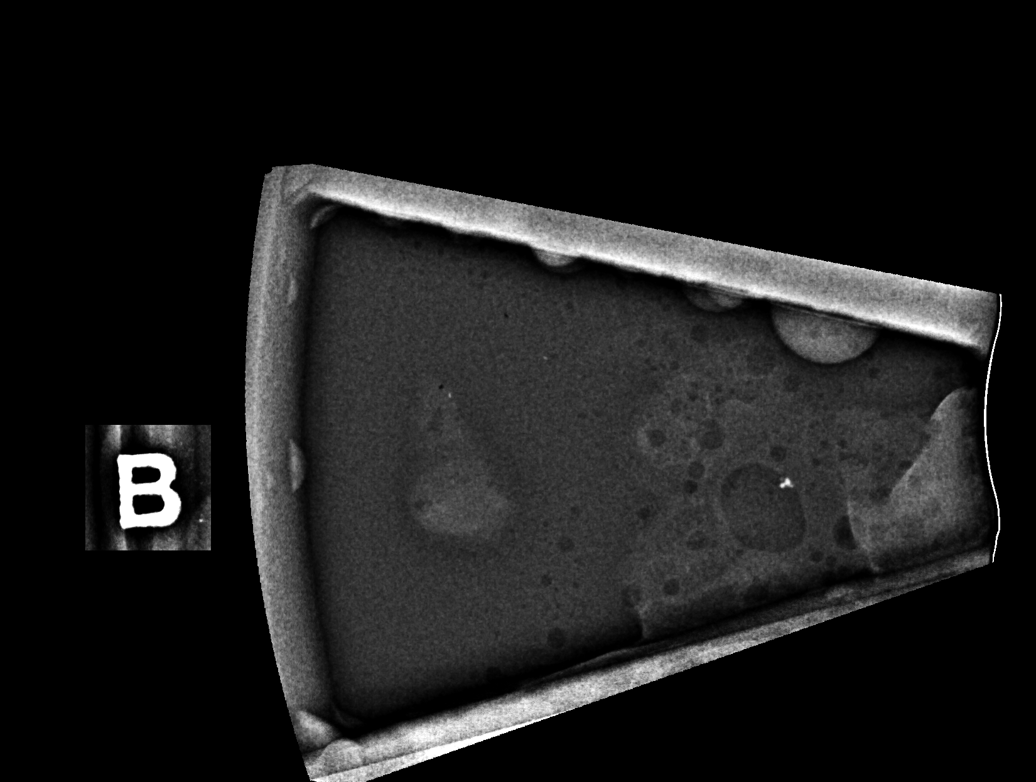

[R (3 of 5)]
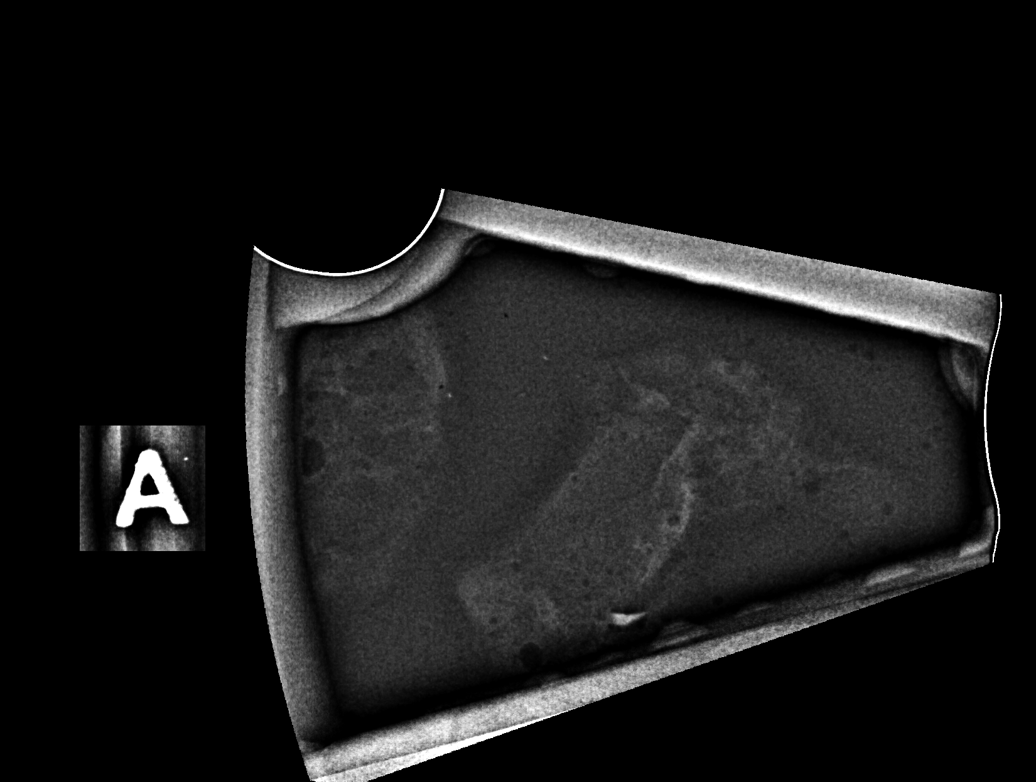

[R (4 of 5)]
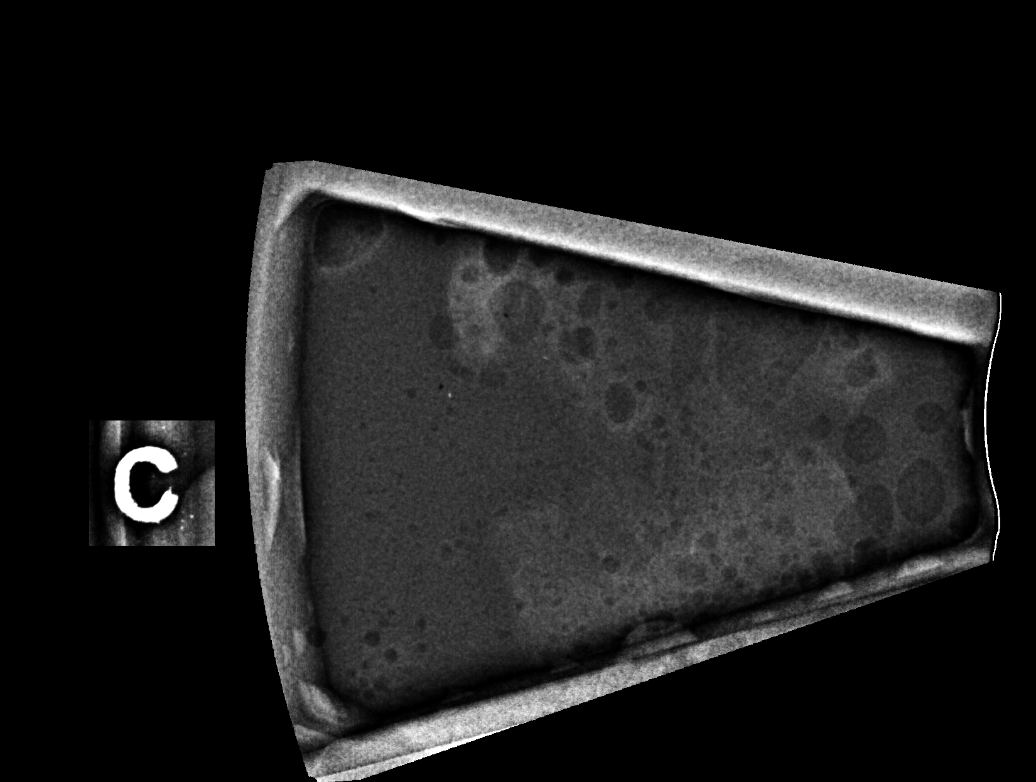

[R (5 of 5)]
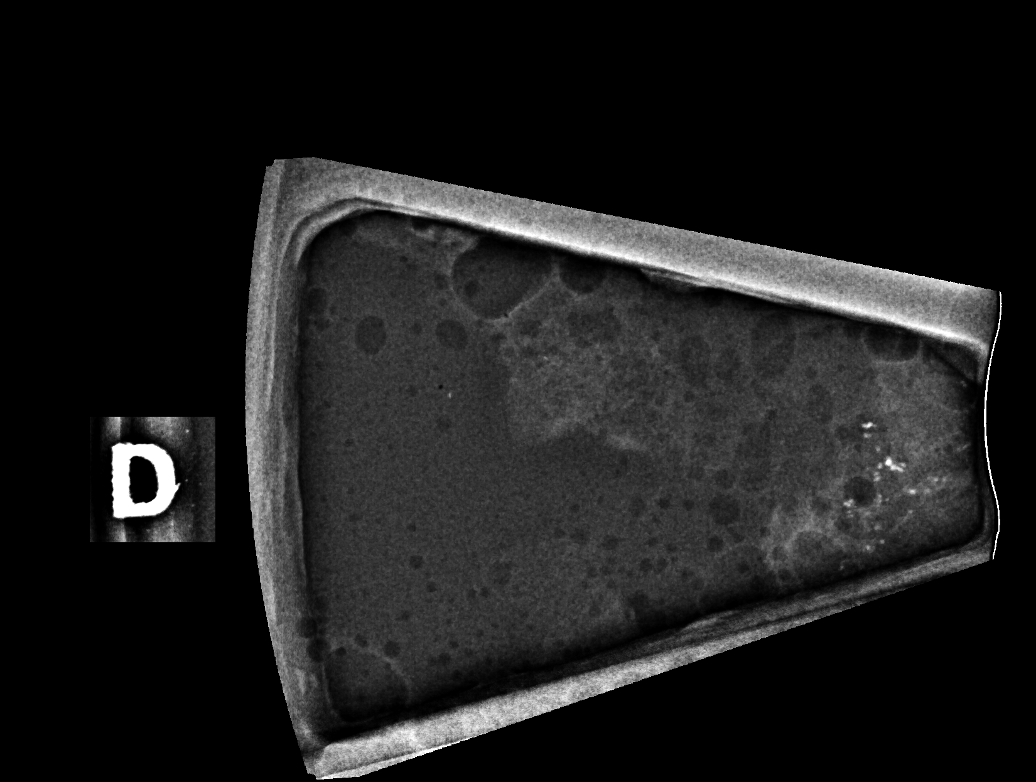

[L CC]
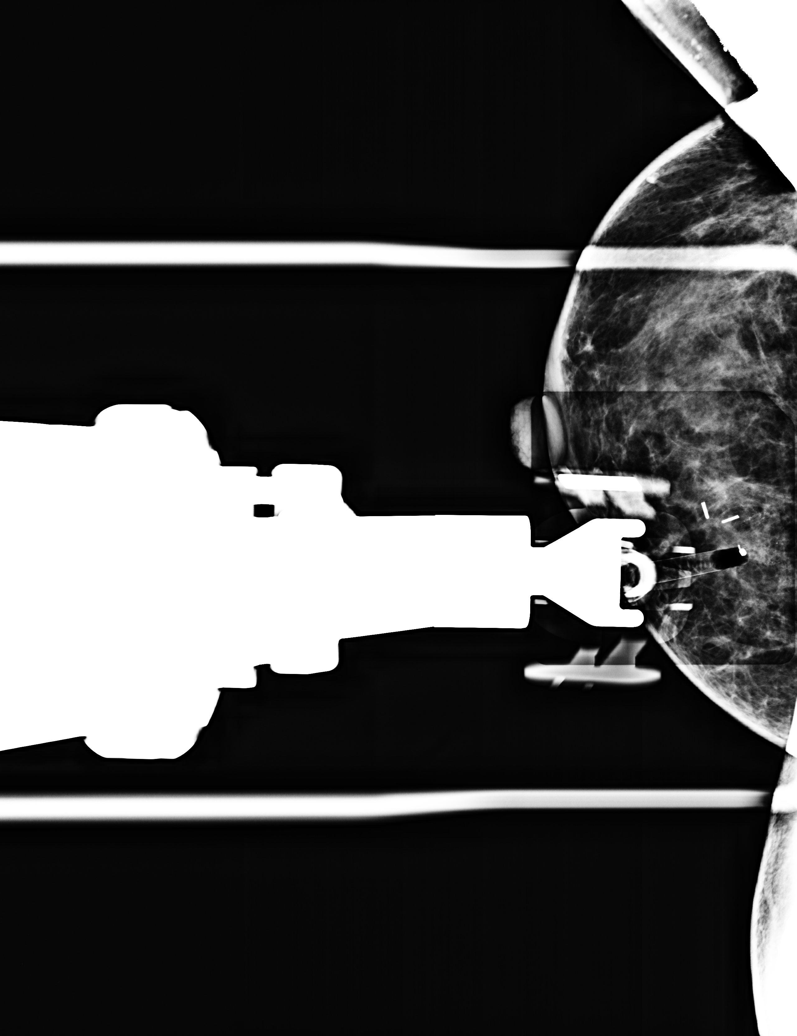

[6 of 16 positions shown; findings below may reference images not displayed]



LEFT BREAST STEREOTACTIC CORE NEEDLE BIOPSY

Using sterile technique and 1% Lidocaine as local anesthetic, under
stereotactic guidance, a 9 gauge vacuum assisted device was used to
perform core needle biopsy of the 2.9 cm group of calcifications
within the UPPER-OUTER LEFT breast using a SUPERIOR approach.
Specimen radiograph was performed showing calcifications. Specimens
with calcifications are identified for pathology.

At the conclusion of the procedure, tissue marker clip was deployed
into the biopsy cavity. Follow-up 2-view mammogram was performed and
dictated separately.
IMPRESSION: Stereotactic-guided biopsy of UPPER-OUTER LEFT breast
calcifications. No apparent complications.

Despite thorough evaluation, the calcifications within the posterior
central RIGHT breast visualized on [DATE] could not be
identified with certainty today. Therefore stereotactic guided RIGHT
breast biopsy was not performed. If the LEFT breast biopsy is
malignant, than breast MRI would be recommended. If the LEFT breast
biopsy is benign, then RIGHT diagnostic mammogram follow-up in 6
months of the possible RIGHT breast calcifications is recommended.

ADDENDUM:
Pathology revealed FAT NECROSIS WITH CALCIFICATIONS- NO EVIDENCE OF
MALIGNANCY of the LEFT breast, upper outer. This was found to be
concordant by Dr. NKEM.

Pathology results were discussed with the patient by telephone with
Dr. NKEM. The patient reported doing well after the biopsy with
tenderness at the site. Post biopsy instructions and care were
reviewed and questions were answered. The patient was encouraged to
call The [REDACTED] for any additional
concerns.

The patient was instructed to return for bilateral diagnostic
mammography in 6 months and informed a reminder notice would be sent
regarding this appointment.

Pathology results reported by NKEM RN on [DATE].



LEFT BREAST STEREOTACTIC CORE NEEDLE BIOPSY

Using sterile technique and 1% Lidocaine as local anesthetic, under
stereotactic guidance, a 9 gauge vacuum assisted device was used to
perform core needle biopsy of the 2.9 cm group of calcifications
within the UPPER-OUTER LEFT breast using a SUPERIOR approach.
Specimen radiograph was performed showing calcifications. Specimens
with calcifications are identified for pathology.

At the conclusion of the procedure, tissue marker clip was deployed
into the biopsy cavity. Follow-up 2-view mammogram was performed and
dictated separately.
IMPRESSION: Stereotactic-guided biopsy of UPPER-OUTER LEFT breast
calcifications. No apparent complications.

Despite thorough evaluation, the calcifications within the posterior
central RIGHT breast visualized on [DATE] could not be
identified with certainty today. Therefore stereotactic guided RIGHT
breast biopsy was not performed. If the LEFT breast biopsy is
malignant, than breast MRI would be recommended. If the LEFT breast
biopsy is benign, then RIGHT diagnostic mammogram follow-up in 6
months of the possible RIGHT breast calcifications is recommended.

## 2021-02-21 ENCOUNTER — Telehealth: Payer: Self-pay | Admitting: *Deleted

## 2021-02-21 NOTE — Telephone Encounter (Signed)
This nurse completed [employer] disability form 02/17/2021 requesting collaborative connect with patient per [facility] protocol to complete forms and obtain provider signature.  "Projecting return to work date", "Total hours in workday patient may sit stand and walk" status not present with review of EMR.    Message left for VERTA RIEDLINGER [phone]) requesting return call with current employment/leave status peer APP instructions.    [employer] claim: [id number] returned via fax with "Inconclusive,6 month estimate return and Zero (0) total hours activity.  Awaiting return call from patient with current employment disability status.   Possibilities include continuous, complete leave, work accommodation, date able or planning return to work, need to work with or without restrictions, needed work hours for changes as needed.  [employer] request records from 03-08-2020 to present submitted to [facility]

## 2021-03-26 ENCOUNTER — Other Ambulatory Visit: Payer: Self-pay | Admitting: *Deleted

## 2021-03-26 ENCOUNTER — Other Ambulatory Visit (HOSPITAL_COMMUNITY): Payer: Self-pay

## 2021-03-26 MED ORDER — VENLAFAXINE HCL ER 75 MG PO CP24
75.0000 mg | ORAL_CAPSULE | Freq: Every day | ORAL | 3 refills | Status: DC
  Filled 2021-03-26: qty 90, 90d supply, fill #0
  Filled 2021-08-14: qty 90, 90d supply, fill #1
  Filled 2021-12-12: qty 90, 90d supply, fill #2

## 2021-03-26 NOTE — Progress Notes (Signed)
Received call from pt with complaints of hot flashes not alleviated with Effexor 37.5 mg p.o daily.  Per MD pt needing to increase dosage to Effexor 75 mg p.o. daily  Prescription sent to pharmacy on file, pt verbalized understanding of medication administration.

## 2021-03-31 ENCOUNTER — Other Ambulatory Visit (HOSPITAL_COMMUNITY): Payer: Self-pay

## 2021-04-08 NOTE — Progress Notes (Signed)
Patient Care Team: Orma Flaming, MD as PCP - General (Family Medicine) Rolm Bookbinder, MD as Consulting Physician (General Surgery) Nicholas Lose, MD as Consulting Physician (Hematology and Oncology) Kyung Rudd, MD as Consulting Physician (Radiation Oncology) Azucena Fallen, MD as Consulting Physician (Obstetrics and Gynecology)  DIAGNOSIS:    ICD-10-CM   1. Ductal carcinoma in situ (DCIS) of left breast  D05.12     SUMMARY OF ONCOLOGIC HISTORY: Oncology History  Ductal carcinoma in situ (DCIS) of left breast  02/08/2020 Initial Diagnosis   Screening mammogram detected left breast calcifications, 2.9cm. Biopsy showed DCIS with calcifications, low grade, ER+ 70%, PR+ 90%.   02/20/2020 Genetic Testing   Negative genetic testing:  No pathogenic variants detected on the [facility] Breast Cancer STAT or Multi-Cancer Panels. The report date is 02/20/2020.  The Breast Cancer STAT Panel offered by [facility] includes sequencing and deletion/duplication analysis for the following 9 genes:  ATM, BRCA1, BRCA2, CDH1, CHEK2, PALB2, PTEN, STK11 and TP53.  The Multi-Cancer Panel offered by [facility] includes sequencing and/or deletion duplication testing of the following 85 genes: AIP, ALK, APC, ATM, AXIN2,BAP1,  BARD1, BLM, BMPR1A, BRCA1, BRCA2, BRIP1, CASR, CDC73, CDH1, CDK4, CDKN1B, CDKN1C, CDKN2A (p14ARF), CDKN2A (p16INK4a), CEBPA, CHEK2, CTNNA1, DICER1, DIS3L2, EGFR (c.2369C>T, p.Thr790Met variant only), EPCAM (Deletion/duplication testing only), FH, FLCN, GATA2, GPC3, GREM1 (Promoter region deletion/duplication testing only), HOXB13 (c.251G>A, p.Gly84Glu), HRAS, KIT, MAX, MEN1, MET, MITF (c.952G>A, p.Glu318Lys variant only), MLH1, MSH2, MSH3, MSH6, MUTYH, NBN, NF1, NF2, NTHL1, PALB2, PDGFRA, PHOX2B, PMS2, POLD1, POLE, POT1, PRKAR1A, PTCH1, PTEN, RAD50, RAD51C, RAD51D, RB1, RECQL4, RET, RNF43, RUNX1, SDHAF2, SDHA (sequence changes only), SDHB, SDHC, SDHD, SMAD4, SMARCA4, SMARCB1, SMARCE1, STK11, SUFU,  TERC, TERT, TMEM127, TP53, TSC1, TSC2, VHL, WRN and WT1.   03/12/2020 Surgery   Left lumpectomy Donne Hazel) [phone]): flat epithelial atypia with calcifications. Negative margins. No regional lymph nodes were examined. ER/PR +.   04/23/2020 - 06/07/2020 Radiation Therapy   The patient initially received a dose of 50.4 Gy in 28 fractions to the breast using whole-breast tangent fields. This was delivered using a 3-D conformal technique. The pt received a boost delivering an additional 10 Gy in 5 fractions using a electron boost with 27mV electrons. The total dose was 60.4 Gy.   06/2020 - 06/2025 Anti-estrogen oral therapy   Tamoxifen     CHIEF COMPLIANT: Follow-up of left breast DCIS on tamoxifen  INTERVAL HISTORY: Victoria DORFMANis a 45y.o. with above-mentioned history of left breast DCISwhounderwent a left lumpectomy, radiation, and is currently on antiestrogen therapy with tamoxifen.Mammogram on 02/10/21 showed a 2.9cm group of calcifications adjacent to the lumpectomy site in the upper outer left breast. Biopsy on 02/19/21 showed no evidence of malignancy. She presents to the clinic todayfor follow-up.  Her major complaints are related to hot flashes related to tamoxifen.  She is also experiencing insomnia issues.   ALLERGIES:  is allergic to trazodone and nefazodone and zoloft [sertraline hcl].  MEDICATIONS:  Current Outpatient Medications  Medication Sig Dispense Refill  . acetaminophen (TYLENOL) 325 MG tablet Take 650 mg by mouth every 6 (six) hours as needed.    .Marland Kitchenalbuterol (VENTOLIN HFA) 108 (90 Base) MCG/ACT inhaler INHALE 1-2 PUFFS INTO THE LUNGS EVERY 6 HOURS AS NEEDED FOR WHEEZING OR SHORTNESS OF BREATH. 18 g 1  . diphenhydrAMINE (BENADRYL) 25 mg capsule Take 25 mg by mouth at bedtime as needed.    . fluticasone (FLOVENT HFA) 110 MCG/ACT inhaler INHALE 2 PUFFS INTO THE LUNGS IN THE MORNING  AND AT BEDTIME. 12 g 12  . ibuprofen (ADVIL) 600 MG tablet Take 1 tablet (600 mg  total) by mouth every 6 (six) hours as needed. (Patient not taking: Reported on 01/27/2021) 30 tablet 0  . ibuprofen (ADVIL) 600 MG tablet TAKE 1 TABLET BY MOUTH EVERY 8 HOURS AS NEEDED FOR MODERATE PAIN 30 tablet 1  . tamoxifen (NOLVADEX) 20 MG tablet TAKE 1 TABLET BY MOUTH ONCE DAILY 90 tablet 3  . venlafaxine XR (EFFEXOR-XR) 75 MG 24 hr capsule Take 1 capsule (75 mg total) by mouth daily with breakfast. 90 capsule 3  . verapamil (CALAN-SR) 180 MG CR tablet TAKE 1 TABLET BY MOUTH EVERY MORNING 90 tablet 0   No current facility-administered medications for this visit.    PHYSICAL EXAMINATION: ECOG PERFORMANCE STATUS: 1 - Symptomatic but completely ambulatory  Vitals:   04/09/21 1154  BP: 128/67  Pulse: 67  Resp: 18  Temp: 97.6 F (36.4 C)  SpO2: 100%   Filed Weights   04/09/21 1154  Weight: 133 lb (60.3 kg)    BREAST: No palpable masses or nodules in either right or left breasts. No palpable axillary supraclavicular or infraclavicular adenopathy no breast tenderness or nipple discharge. (exam performed in the presence of a chaperone)  LABORATORY DATA:  I have reviewed the data as listed CMP Latest Ref Rng & Units 01/21/2021 02/14/2020 05/04/2018  Glucose 70 - 99 mg/dL 97 107(H) 110(H)  BUN 6 - 20 mg/dL 16 12 13   Creatinine 0.44 - 1.00 mg/dL 0.84 0.77 0.65  Sodium 135 - 145 mmol/L 138 140 136  Potassium 3.5 - 5.1 mmol/L 3.5 3.8 3.7  Chloride 98 - 111 mmol/L 103 106 102  CO2 22 - 32 mmol/L 21(L) 27 28  Calcium 8.9 - 10.3 mg/dL 9.3 8.9 9.5  Total Protein 6.5 - 8.1 g/dL 7.4 7.3 7.3  Total Bilirubin 0.3 - 1.2 mg/dL 0.3 <0.2(L) 0.3  Alkaline Phos 38 - 126 U/L 63 81 54  AST 15 - 41 U/L 18 12(L) 14  ALT 0 - 44 U/L 15 11 15     Lab Results  Component Value Date   WBC 4.7 01/21/2021   HGB 11.7 (L) 01/21/2021   HCT 36.4 01/21/2021   MCV 83.3 01/21/2021   PLT 290 01/21/2021   NEUTROABS 2.1 02/14/2020    ASSESSMENT & PLAN:  Ductal carcinoma in situ (DCIS) of left  breast 02/08/2020:Screening mammogram detected left breast calcifications, 2.9cm. 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 Adjuvant radiation therapy completed 06/06/2020  Current treatment: antiestrogen therapy with tamoxifen 5 years started July 2021 Tamoxifen toxicities: 1.  Hot flashes and mood swings: Currently on increased dosage of Effexor.  She only started the 75 mg dose a week ago and has not seen any major difference.  Insomnia: I discussed with her extensively about all the things that she needs to do to help sleep better.  CT chest March 2022: 3 mm lung nodule: I discussed with her that it appears to be benign and we will obtain another CT chest with contrast in 6 months and follow-up after that.    Breast cancer surveillance: 1.  Breast exam 01/27/2021: Benign 2. Mammogram 02/10/2021: 2.9 cm group of calcifications adjacent to the lumpectomy site.  1 cm group of calcifications posterior central right breast: Biopsy benign  Return to clinic in  1 year for follow-up     No orders of the defined types were placed in this encounter.  The patient has a good understanding of the overall plan. she agrees with it. she will call with any problems that may develop before the next visit here.  Total time spent: 20 mins including face to face time and time spent for planning, charting and coordination of care  Rulon Eisenmenger, MD, MPH 04/09/2021  I, Cloyde Reams Dorshimer, am acting as scribe for Dr. Nicholas Lose.  I have reviewed the above documentation for accuracy and completeness, and I agree with the above.

## 2021-04-08 NOTE — Assessment & Plan Note (Signed)
02/08/2020:Screening mammogram detected left breast calcifications, 2.9cm. 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 Adjuvant radiation therapy completed 06/06/2020  Current treatment: antiestrogen therapy with tamoxifen 5 years started July 2021 Tamoxifen toxicities: 1.  Hot flashes and mood swings: I recommended that we start her Effexor.  She is very happy to hear that it could help both the symptoms.  CT chest March 2022: 3 mm lung nodule: I discussed with her that it appears to be benign and we will obtain another CT chest with contrast in 6 months and follow-up after that. Regarding the intramammary lymph node I recommended that she immediately get mammogram performed.  A lot of times after radiation the CT scans may show abnormalities which can only be evaluated properly with her mammogram with or without an ultrasound.  Breast cancer surveillance: 1.  Breast exam 01/27/2021: Benign 2. Mammogram 02/10/2021: 2.9 cm group of calcifications adjacent to the lumpectomy site.  1 cm group of calcifications posterior central right breast: Biopsy benign  Return to clinic in 1 year for follow-up

## 2021-04-09 ENCOUNTER — Other Ambulatory Visit: Payer: Self-pay

## 2021-04-09 ENCOUNTER — Other Ambulatory Visit (HOSPITAL_COMMUNITY): Payer: Self-pay

## 2021-04-09 ENCOUNTER — Inpatient Hospital Stay: Payer: No Typology Code available for payment source | Attending: Adult Health | Admitting: Hematology and Oncology

## 2021-04-09 DIAGNOSIS — Z79810 Long term (current) use of selective estrogen receptor modulators (SERMs): Secondary | ICD-10-CM | POA: Insufficient documentation

## 2021-04-09 DIAGNOSIS — Z7951 Long term (current) use of inhaled steroids: Secondary | ICD-10-CM | POA: Diagnosis not present

## 2021-04-09 DIAGNOSIS — N951 Menopausal and female climacteric states: Secondary | ICD-10-CM | POA: Insufficient documentation

## 2021-04-09 DIAGNOSIS — R918 Other nonspecific abnormal finding of lung field: Secondary | ICD-10-CM | POA: Insufficient documentation

## 2021-04-09 DIAGNOSIS — D0512 Intraductal carcinoma in situ of left breast: Secondary | ICD-10-CM | POA: Diagnosis present

## 2021-04-09 DIAGNOSIS — Z79899 Other long term (current) drug therapy: Secondary | ICD-10-CM | POA: Insufficient documentation

## 2021-04-09 DIAGNOSIS — G4700 Insomnia, unspecified: Secondary | ICD-10-CM | POA: Insufficient documentation

## 2021-04-09 DIAGNOSIS — Z923 Personal history of irradiation: Secondary | ICD-10-CM | POA: Diagnosis not present

## 2021-04-09 MED ORDER — TAMOXIFEN CITRATE 20 MG PO TABS
ORAL_TABLET | Freq: Every day | ORAL | 3 refills | Status: AC
  Filled 2021-04-09: qty 90, 90d supply, fill #0
  Filled 2021-09-05: qty 90, 90d supply, fill #1
  Filled 2022-03-13: qty 90, 90d supply, fill #2

## 2021-04-09 MED ORDER — PANTOPRAZOLE SODIUM 40 MG PO TBEC
40.0000 mg | DELAYED_RELEASE_TABLET | Freq: Every day | ORAL | 6 refills | Status: DC
  Filled 2021-04-09: qty 30, 30d supply, fill #0
  Filled 2021-08-14: qty 30, 30d supply, fill #1

## 2021-04-10 ENCOUNTER — Telehealth: Payer: Self-pay | Admitting: Hematology and Oncology

## 2021-04-10 NOTE — Telephone Encounter (Signed)
Scheduled appointment per 06/01 los. Patient will receive calender.

## 2021-04-15 ENCOUNTER — Other Ambulatory Visit: Payer: Self-pay | Admitting: Family Medicine

## 2021-04-15 ENCOUNTER — Other Ambulatory Visit (HOSPITAL_COMMUNITY): Payer: Self-pay

## 2021-04-15 DIAGNOSIS — I10 Essential (primary) hypertension: Secondary | ICD-10-CM

## 2021-04-15 MED ORDER — VERAPAMIL HCL ER 180 MG PO TBCR
EXTENDED_RELEASE_TABLET | Freq: Every morning | ORAL | 0 refills | Status: DC
Start: 2021-04-15 — End: 2021-09-05
  Filled 2021-04-15: qty 90, 90d supply, fill #0

## 2021-04-16 ENCOUNTER — Other Ambulatory Visit (HOSPITAL_COMMUNITY): Payer: Self-pay

## 2021-05-01 ENCOUNTER — Encounter: Payer: No Typology Code available for payment source | Admitting: Family Medicine

## 2021-05-06 ENCOUNTER — Encounter (HOSPITAL_COMMUNITY): Payer: Self-pay

## 2021-07-01 LAB — LIPID PANEL
HDL: 59 (ref 35–70)
LDL Cholesterol: 80
Triglycerides: 225 — AB (ref 40–160)

## 2021-07-01 LAB — BASIC METABOLIC PANEL
BUN: 9 (ref 4–21)
CO2: 27 — AB (ref 13–22)
Chloride: 102 (ref 99–108)
Creatinine: 0.7 (ref 0.5–1.1)
Glucose: 97
Potassium: 4.1 (ref 3.4–5.3)
Sodium: 142 (ref 137–147)

## 2021-07-01 LAB — CBC AND DIFFERENTIAL
HCT: 39 (ref 36–46)
Hemoglobin: 12.5 (ref 12.0–16.0)
Platelets: 256 (ref 150–399)
WBC: 2.5

## 2021-07-01 LAB — HEMOGLOBIN A1C: Hemoglobin A1C: 6.2

## 2021-07-01 LAB — HEPATIC FUNCTION PANEL
ALT: 16 (ref 7–35)
AST: 21 (ref 13–35)
Alkaline Phosphatase: 83 (ref 25–125)
Bilirubin, Total: 0.2

## 2021-07-01 LAB — COMPREHENSIVE METABOLIC PANEL
Albumin: 4.4 (ref 3.5–5.0)
Calcium: 9.5 (ref 8.7–10.7)
GFR calc Af Amer: 106
Globulin: 3

## 2021-07-01 LAB — TSH: TSH: 2.1 (ref 0.41–5.90)

## 2021-07-01 LAB — CBC: RBC: 4.8 (ref 3.87–5.11)

## 2021-07-08 ENCOUNTER — Ambulatory Visit (INDEPENDENT_AMBULATORY_CARE_PROVIDER_SITE_OTHER): Payer: No Typology Code available for payment source | Admitting: Family Medicine

## 2021-07-08 ENCOUNTER — Encounter: Payer: Self-pay | Admitting: Family Medicine

## 2021-07-08 ENCOUNTER — Other Ambulatory Visit: Payer: Self-pay

## 2021-07-08 VITALS — BP 150/88 | HR 63 | Temp 98.1°F | Ht 63.0 in | Wt 130.6 lb

## 2021-07-08 DIAGNOSIS — E781 Pure hyperglyceridemia: Secondary | ICD-10-CM

## 2021-07-08 DIAGNOSIS — F4323 Adjustment disorder with mixed anxiety and depressed mood: Secondary | ICD-10-CM

## 2021-07-08 DIAGNOSIS — R300 Dysuria: Secondary | ICD-10-CM

## 2021-07-08 DIAGNOSIS — K219 Gastro-esophageal reflux disease without esophagitis: Secondary | ICD-10-CM | POA: Insufficient documentation

## 2021-07-08 DIAGNOSIS — R7303 Prediabetes: Secondary | ICD-10-CM

## 2021-07-08 DIAGNOSIS — M25511 Pain in right shoulder: Secondary | ICD-10-CM | POA: Insufficient documentation

## 2021-07-08 DIAGNOSIS — D0512 Intraductal carcinoma in situ of left breast: Secondary | ICD-10-CM

## 2021-07-08 DIAGNOSIS — K2100 Gastro-esophageal reflux disease with esophagitis, without bleeding: Secondary | ICD-10-CM

## 2021-07-08 DIAGNOSIS — I10 Essential (primary) hypertension: Secondary | ICD-10-CM

## 2021-07-08 DIAGNOSIS — M549 Dorsalgia, unspecified: Secondary | ICD-10-CM

## 2021-07-08 LAB — URINALYSIS, ROUTINE W REFLEX MICROSCOPIC
Bilirubin Urine: NEGATIVE
Hgb urine dipstick: NEGATIVE
Ketones, ur: NEGATIVE
Leukocytes,Ua: NEGATIVE
Nitrite: NEGATIVE
Specific Gravity, Urine: 1.01 (ref 1.000–1.030)
Total Protein, Urine: NEGATIVE
Urine Glucose: NEGATIVE
Urobilinogen, UA: 0.2 (ref 0.0–1.0)
pH: 7 (ref 5.0–8.0)

## 2021-07-08 NOTE — Assessment & Plan Note (Signed)
Check by OB/GYN.  Last triglycerides 255.  Discussed lifestyle modifications.  Recheck 6 to 12 months.

## 2021-07-08 NOTE — Assessment & Plan Note (Signed)
Given chronicity of symptoms and failure to respond to physical therapy in the past will place referral to sports medicine for further evaluation and management.

## 2021-07-08 NOTE — Patient Instructions (Signed)
It was very nice to see you today!  Please increase your Effexor to 150 mg daily.  Send a message in a few weeks let me know how this is working for you.  We typically need to have you on a higher dose to have much benefit with the stress/anxiety/depression.  No other changes today.  We will keep an eye on your triglycerides and blood sugar.  We do not need to start medications but you should continue working on diet and exercise and we can recheck again in 6 to 12 months.  I will see back in year for your annual checkup.  Please come back to see me sooner if needed.  Take care, Dr Jerline Pain  PLEASE NOTE:  If you had any lab tests please let us know if you have not heard back within a few days. You may see your results on mychart before we have a chance to review them but we will give you a call once they are reviewed by [place]. If we ordered any referrals today, please let us know if you have not heard from their office within the next week.   Please try these tips to maintain a healthy lifestyle:  Eat at least 3 REAL meals and 1-2 snacks per day.  Aim for no more than 5 hours between eating.  If you eat breakfast, please do so within one hour of getting up.   Each meal should contain half fruits/vegetables, one quarter protein, and one quarter carbs (no bigger than a computer mouse)  Cut down on sweet beverages. This includes juice, soda, and sweet tea.   Drink at least 1 glass of water with each meal and aim for at least 8 glasses per day  Exercise at least 150 minutes every week.

## 2021-07-08 NOTE — Assessment & Plan Note (Signed)
Slightly above goal though has typically been well controlled.  Continue verapamil 180 mg daily.  Continue home monitoring goal 140/90 or lower.

## 2021-07-08 NOTE — Progress Notes (Signed)
Victoria Maddox is a 45 y.o. female who presents today for an office visit.  Assessment/Plan:  New/Acute Problems: Dysuria Symptoms seem to be improving.  We will check UA and urine culture today.  Chronic Problems Addressed Today: Upper back pain Given chronicity of symptoms and failure to respond to physical therapy in the past will place referral to sports medicine for further evaluation and management.  GERD (gastroesophageal reflux disease) Stable on Protonix 40 mg daily as needed.  Hypertriglyceridemia Check by OB/GYN.  Last triglycerides 255.  Discussed lifestyle modifications.  Recheck 6 to 12 months.  Prediabetes A1c 6.2 as checked at gynecologist office few weeks ago.  Discussed lifestyle modifications.  We can recheck in 6 to 12 months.  Adjustment disorder with mixed anxiety and depressed mood Not controlled.  She has been on a couple of SSRIs in the past including Zoloft and Celexa which did not work well.  She had some bad reaction to trazodone as well.  She is currently taking Effexor to help mitigate some of the side effects of tamoxifen.  She has been tolerating this well.  Will increase to 150 mg daily and she will check in with me in a few weeks.  We can titrate dose as needed.  HTN (hypertension) Slightly above goal though has typically been well controlled.  Continue verapamil 180 mg daily.  Continue home monitoring goal 140/90 or lower.     Subjective:  HPI:  She is here to transfer of care. Previous PCP no longer works at this office.  She complains of depression that is getting worse. She sate medication did not help with the symptoms. She state she feel anxious while on the medication so she stop using it.   She is currently on Effexor. She state symptoms are still present for anxiety and depression. Associated symptoms are hot flashes, nausea, and feeling of passing out. Denies fever or chills.  In addition to this, she complains of shoulder pain.  She tried physical therapy for shoulder pain.However, she state it did not help with the symptoms.   She have a past medical hx of breast cancer. Last mammogram was normal. Her next mammogram is in October.She has a past medical history of Allergy, Depression, Family history of breast cancer, Family history of kidney cancer, Family history of throat cancer.   PMH:  The following were reviewed and entered/updated in epic: Past Medical History:  Diagnosis Date   Allergy    Depression    Family history of breast cancer    Family history of kidney cancer    Family history of throat cancer    Frequent headaches    Heart murmur    History of recurrent UTIs    Hypertension    Migraines    Personal history of radiation therapy    Urinary incontinence    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   HTN (hypertension) 05/04/2018   Insomnia 05/04/2018   Adjustment disorder with mixed anxiety and depressed mood 05/04/2018   Past Surgical History:  Procedure Laterality Date   BREAST BIOPSY Left  01/29/2020   BREAST BIOPSY Left 02/06/2020   BREAST LUMPECTOMY Left 03/12/2020   BREAST LUMPECTOMY WITH RADIOACTIVE SEED LOCALIZATION Left 03/12/2020   Procedure: LEFT BREAST LUMPECTOMY WITH BRACKETED RADIOACTIVE SEED LOCALIZATION;  Surgeon: Rolm Bookbinder, MD;  Location: [facility];  Service: General;  Laterality: Left;   DILATION AND CURETTAGE OF UTERUS      Family History  Problem Relation Age of Onset   Heart attack Mother    Heart disease Mother    Hypertension Mother    Early death Father         unknown cause   Hypertension Father    Hypertension Sister    Diabetes Brother    Hypertension Brother    Asthma Sister    Hypertension Sister    Hyperlipidemia Brother    Hypertension Brother    Mental illness Brother    Heart disease Sister    Hypertension Sister    Arthritis Brother    Diabetes Brother    Hypertension Brother    Breast cancer Cousin 106       paternal first cousin   Throat cancer Sister 47       non-smoker   Breast cancer Cousin        dx. in her 75s, paternal first cousin once removed   Kidney cancer Other 92    Medications- reviewed and updated Current Outpatient Medications  Medication Sig Dispense Refill   albuterol (VENTOLIN HFA) 108 (90 Base) MCG/ACT inhaler INHALE 1-2 PUFFS INTO THE LUNGS EVERY 6 HOURS AS NEEDED FOR WHEEZING OR SHORTNESS OF BREATH. 18 g 1   diphenhydrAMINE (BENADRYL) 25 mg capsule Take 25 mg by mouth at bedtime as needed.     fluticasone (FLOVENT HFA) 110 MCG/ACT inhaler INHALE 2 PUFFS INTO THE LUNGS IN THE MORNING AND AT BEDTIME. 12 g 12   ibuprofen (ADVIL) 600 MG tablet Take 1 tablet (600 mg total) by mouth every 6 (six) hours as needed. (Patient not taking: Reported on 01/27/2021) 30 tablet 0   ibuprofen (ADVIL) 600 MG tablet TAKE 1 TABLET BY MOUTH EVERY 8 HOURS AS NEEDED FOR MODERATE PAIN 30 tablet 1   pantoprazole (PROTONIX) 40 MG tablet Take 1 tablet (40 mg total) by mouth daily. 30 tablet 6   tamoxifen (NOLVADEX) 20 MG tablet TAKE 1 TABLET BY MOUTH ONCE DAILY 90 tablet 3   venlafaxine XR (EFFEXOR-XR) 75 MG 24 hr capsule Take 1 capsule (75 mg total) by mouth daily with breakfast. 90 capsule 3   verapamil (CALAN-SR) 180 MG CR tablet TAKE 1 TABLET BY MOUTH EVERY MORNING 90 tablet 0   No current facility-administered medications for this visit.    Allergies-reviewed and updated Allergies  Allergen Reactions   Trazodone And Nefazodone     Dizziness   Zoloft [Sertraline Hcl]     Anxiousness, lash out at kids    Social  History   Socioeconomic History   Marital status: Married    Spouse name: Not on file   Number of children: Not on file   Years of education: Not on file   Highest education level: Not on file  Occupational History   Not on file  Tobacco Use   Smoking status: Never   Smokeless tobacco: Never  Vaping Use   Vaping Use: Never used  Substance and Sexual Activity   Alcohol use: Not Currently   Drug use: Never   Sexual activity: Yes    Partners: Male  Other Topics Concern   Not  on file  Social History Narrative   Not on file   Social Determinants of Health   Financial Resource Strain: Not on file  Food Insecurity: Not on file  Transportation Needs: Not on file  Physical Activity: Not on file  Stress: Not on file  Social Connections: Not on file          Objective:  Physical Exam: BP (!) 150/88   Pulse 63   Temp 98.1 F (36.7 C) (Temporal)   Ht '5\' 3"'$  (1.6 m)   Wt 130 lb 9.6 oz (59.2 kg)   SpO2 100%   BMI 23.13 kg/m   Gen: No acute distress, resting comfortably CV: Regular rate and rhythm with no murmurs appreciated Pulm: Normal work of breathing, clear to auscultation bilaterally with no crackles, wheezes, or rhonchi MSK: Bilateral cervical and upper thoracic paraspinal muscles tender to palpation bilaterally. Neuro: Grossly normal, moves all extremities Psych: Normal affect and thought content        I,Savera Zaman,acting as a scribe for Dimas Chyle, MD.,have documented all relevant documentation on the behalf of Dimas Chyle, MD,as directed by  Dimas Chyle, MD while in the presence of Dimas Chyle, MD.  I, Dimas Chyle, MD, have reviewed all documentation for this visit. The documentation on 07/08/21 for the exam, diagnosis, procedures, and orders are all accurate and complete.  Time Spent: 45 minutes of total time was spent on the date of the encounter performing the following actions: chart review prior to seeing the patient including recent visits with  previous PCP, obtaining history, performing a medically necessary exam, counseling on the treatment plan, placing orders, and documenting in our EHR.     Algis Greenhouse. Jerline Pain, MD 07/08/2021 10:02 AM

## 2021-07-08 NOTE — Assessment & Plan Note (Signed)
A1c 6.2 as checked at gynecologist office few weeks ago.  Discussed lifestyle modifications.  We can recheck in 6 to 12 months.

## 2021-07-08 NOTE — Assessment & Plan Note (Signed)
Not controlled.  She has been on a couple of SSRIs in the past including Zoloft and Celexa which did not work well.  She had some bad reaction to trazodone as well.  She is currently taking Effexor to help mitigate some of the side effects of tamoxifen.  She has been tolerating this well.  Will increase to 150 mg daily and she will check in with me in a few weeks.  We can titrate dose as needed.

## 2021-07-08 NOTE — Assessment & Plan Note (Signed)
Stable on Protonix 40 mg daily as needed.

## 2021-07-09 LAB — URINE CULTURE
MICRO NUMBER:: 12309513
Result:: NO GROWTH
SPECIMEN QUALITY:: ADEQUATE

## 2021-07-11 NOTE — Progress Notes (Signed)
Please inform patient of the following:  Urine culture is negative. She does not have a UTI and does not need any antibiotics.  Algis Greenhouse. Jerline Pain, MD 07/11/2021 11:28 AM

## 2021-07-16 NOTE — Progress Notes (Signed)
Subjective:    CC: Shoulder/upper back pain  I, Molly Weber, LAT, ATC, am serving as scribe for Dr. Lynne Leader.  HPI: Pt is a 45 y/o female presenting w/ c/o upper back and B shoulder pain 10 x years that is worsening over the past few months.  She locates her pain specifically to between her scapula radiating proximally to her neck and upper traps.  Radiating pain: yes into her upper traps Neck pain: yes Aggravating factors: fatigued Treatments tried: PT in the past (5 years ago); Tylenol; IBU; massage  Pertinent review of Systems: No fevers or chills  Relevant historical information: History of breast cancer.   Objective:    Vitals:   07/17/21 0933  BP: 130/84  Pulse: 67  SpO2: 98%   General: Well Developed, well nourished, and in no acute distress.   MSK: C-spine nontender midline.  Mildly tender palpation bilateral trapezius and inferior cervical paraspinal musculature. Normal cervical motion pain located inferior paraspinal musculature with cervical motion.   Strength is intact.  Reflexes are intact sensation is intact. T-spine normal-appearing Nontender midline. Tender palpation superior thoracic paraspinal musculature. Normal scapular motion.  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  No acute fractures. Await formal radiology review  X-ray images of thoracic spine available on CT scan chest March 2022 personally and independently interpreted No acute fractures.  No significant degenerative changes.  No aggressive appearing lesions.    Impression and Recommendations:    Assessment and Plan: 45 y.o. female with bilateral trapezius to inferior cervical paraspinal chronic neck pain.  This has been ongoing for about 10 years.  About 5 years ago she had a trial of physical therapy with little success.  Imaging shows a little bit of degenerative changes  but no acute severe change on recent thoracic CT scan ordered today cervical spine x-ray.  Plan for trial of physical therapy and if not better cervical spine MRI.  Additionally recommend heating pad and tizanidine trial.  Reassess in about 6 weeks or proceed to MRI directly.Marland Kitchen  PDMP not reviewed this encounter. Orders Placed This Encounter  Procedures   DG Cervical Spine 2 or 3 views    Standing Status:   Future    Number of Occurrences:   1    Standing Expiration Date:   07/17/2022    Order Specific Question:   Reason for Exam (SYMPTOM  OR DIAGNOSIS REQUIRED)    Answer:   eval pain around Cspine    Order Specific Question:   Is patient pregnant?    Answer:   No    Order Specific Question:   Preferred imaging location?    Answer:   [facility]   Ambulatory referral to Physical Therapy    Referral Priority:   Routine    Referral Type:   Physical Medicine    Referral Reason:   Specialty Services Required    Requested Specialty:   Physical Therapy    Number of Visits Requested:   1   Meds ordered this encounter  Medications   tiZANidine (ZANAFLEX) 2 MG tablet    Sig: Take 1-2 tablets by mouth every 8 hours as needed for muscle spasms.    Dispense:  60 tablet    Refill:  1    Discussed warning signs or symptoms. Please see discharge instructions. Patient expresses understanding.   The above documentation has been reviewed and is accurate and complete  Lynne Leader, M.D.

## 2021-07-17 ENCOUNTER — Encounter: Payer: Self-pay | Admitting: Family Medicine

## 2021-07-17 ENCOUNTER — Other Ambulatory Visit: Payer: Self-pay

## 2021-07-17 ENCOUNTER — Ambulatory Visit (INDEPENDENT_AMBULATORY_CARE_PROVIDER_SITE_OTHER): Payer: No Typology Code available for payment source

## 2021-07-17 ENCOUNTER — Ambulatory Visit (INDEPENDENT_AMBULATORY_CARE_PROVIDER_SITE_OTHER): Payer: No Typology Code available for payment source | Admitting: Family Medicine

## 2021-07-17 ENCOUNTER — Other Ambulatory Visit (HOSPITAL_COMMUNITY): Payer: Self-pay

## 2021-07-17 VITALS — BP 130/84 | HR 67 | Ht 63.0 in | Wt 127.2 lb

## 2021-07-17 DIAGNOSIS — M62838 Other muscle spasm: Secondary | ICD-10-CM

## 2021-07-17 IMAGING — DX DG CERVICAL SPINE 2 OR 3 VIEWS
3 series · 3 of 3 positions shown · non-contrast
Comparison: None.

CLINICAL DATA: eval pain around Cspine

EXAM:
CERVICAL SPINE - 2-3 VIEW

[c-spine lat]
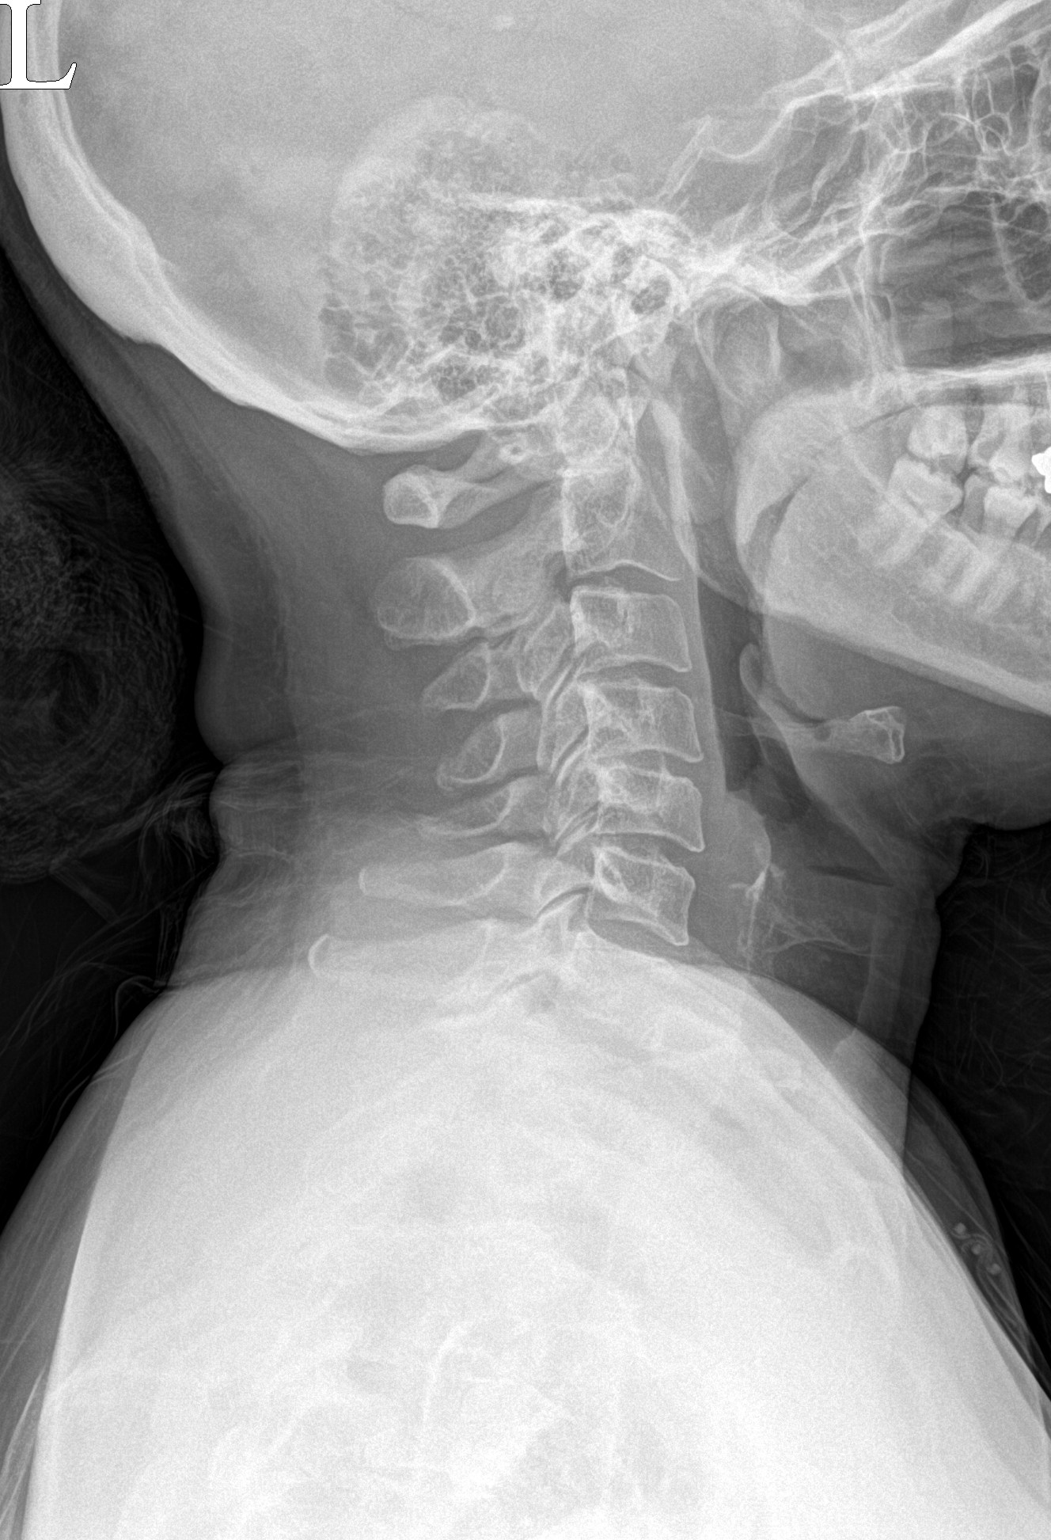

[c-spine ap]
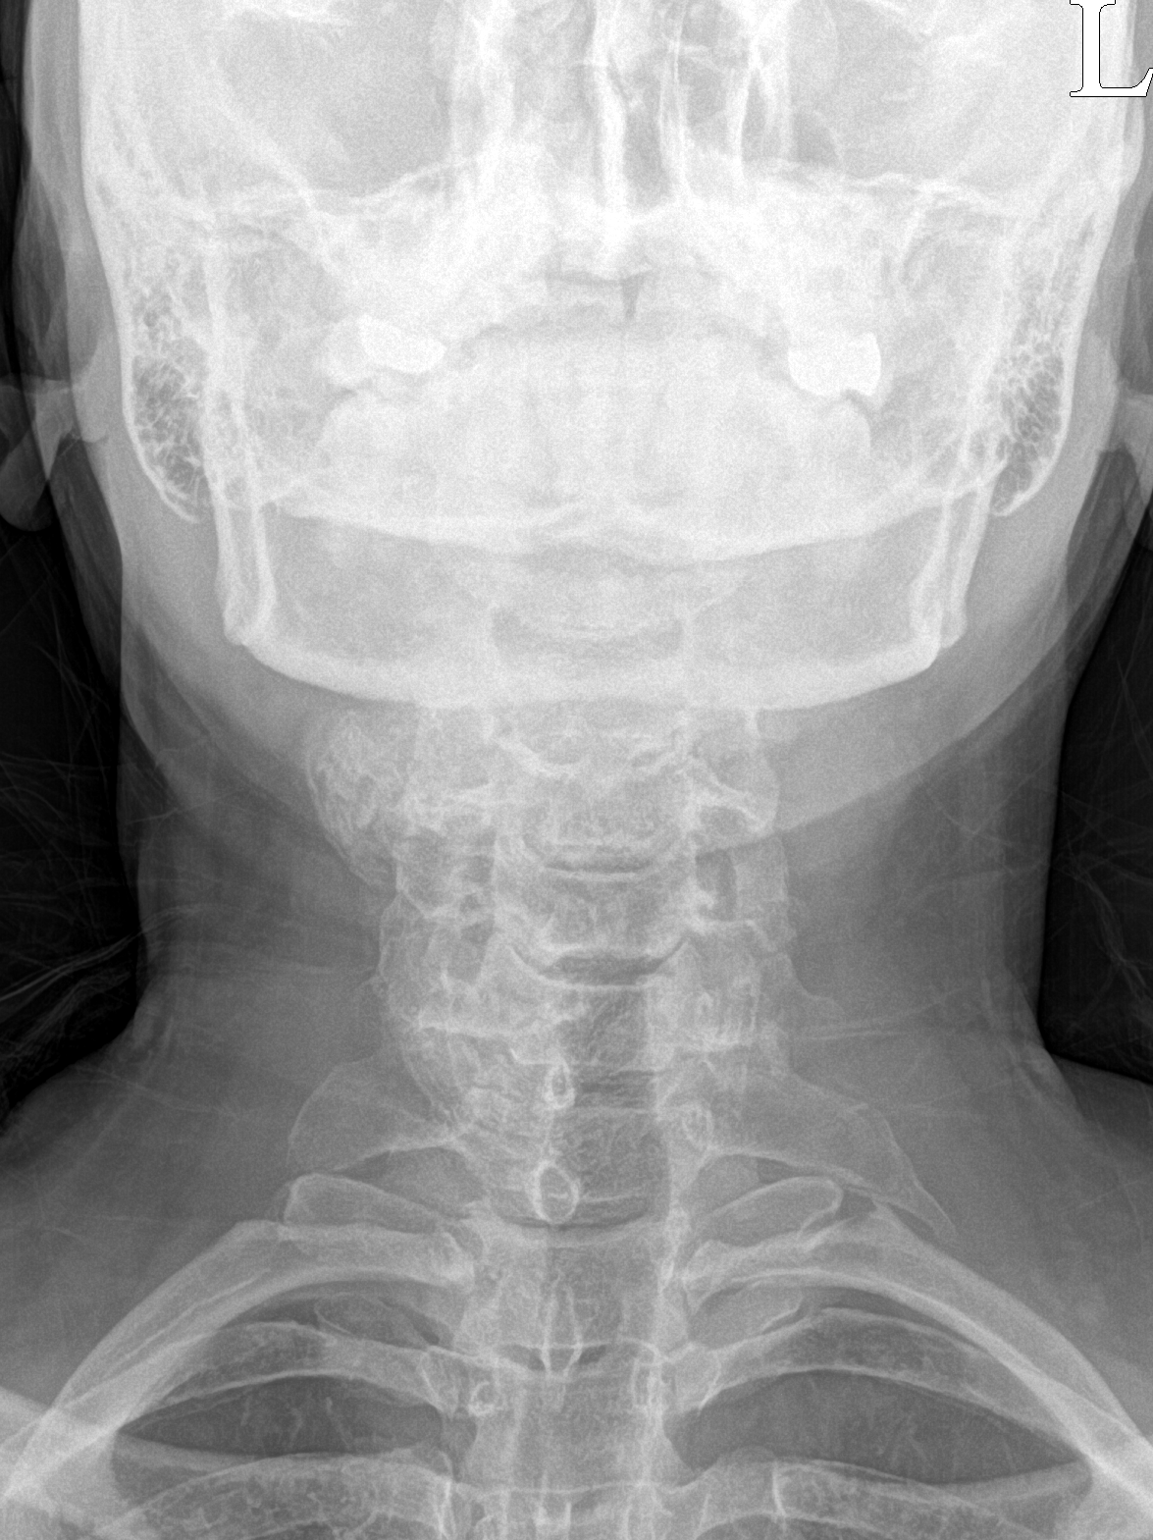

[c-spine open mouth]
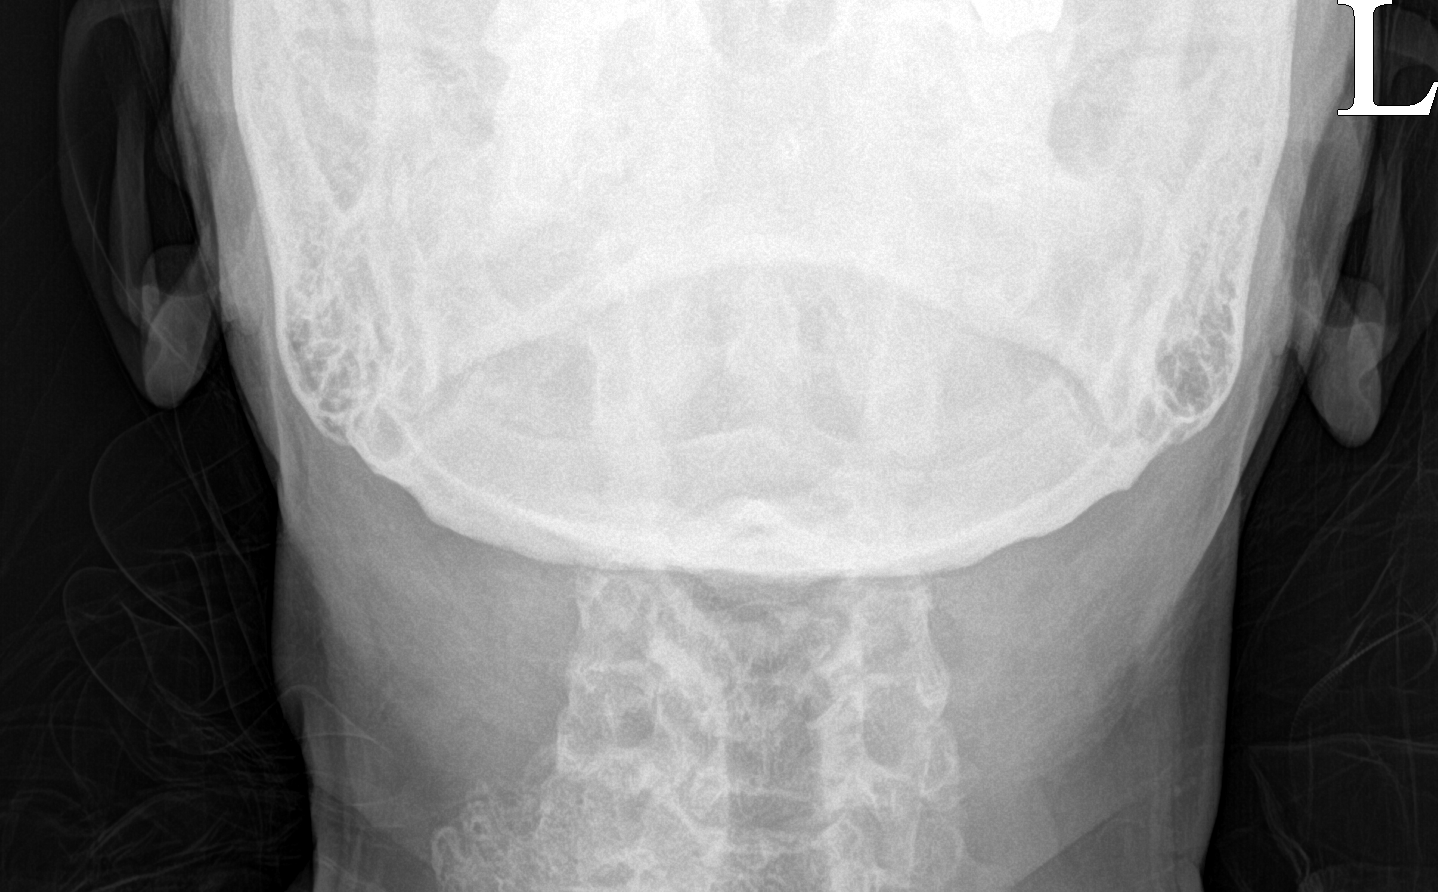

[3 of 3 positions shown; findings below may reference images not displayed]

FINDINGS: The cervical spine is visualized from C1-the superior endplate of
C7. There is a small LEFT cervical rib at C7.Cervical alignment is
maintained. Vertebral body heights are maintained: no evidence of
acute fracture. There is RIGHT sided robust calcific densities of
the mid cervical spine likely reflecting intervertebral disc spaces
are relatively preserved. Severe facet arthropathy. No prevertebral
soft tissue swelling. Visualized thorax is unremarkable.
IMPRESSION: 1. Small LEFT-sided cervical rib at C7.
2. Exuberant osteophyte proliferation along the RIGHT aspect of the
mid cervical spine likely reflecting severe facet arthropathy.

## 2021-07-17 MED ORDER — TIZANIDINE HCL 2 MG PO TABS
2.0000 mg | ORAL_TABLET | Freq: Three times a day (TID) | ORAL | 1 refills | Status: DC | PRN
  Filled 2021-07-17: qty 60, 10d supply, fill #0

## 2021-07-17 NOTE — Patient Instructions (Signed)
Thank you for coming in today.   I've referred you to Physical Therapy.  Let us know if you don't hear from them in one week.   Heating pad Electric Heating Pad, [id number]" Extra Large Wearable Heated Pad for Neck and Shoulders Back Pain and Cramps Relief, with 6 Temperature Levels, 2 Hours Auto Shut-Off, Machine Washable   Try the muscle relaxer mostly at bedtime.   Please get an Xray today before you leave   Recheck with me in 6 weeks.   Please let me know if you have a problem.

## 2021-07-21 NOTE — Progress Notes (Signed)
X-ray shows left-sided cervical rib at C7 and bone spurs and severe facet arthritis present on the right side of the cervical spine.  This is likely the cause of your pain.  If not improved with physical therapy we will proceed to MRI for injection planning.

## 2021-07-29 ENCOUNTER — Other Ambulatory Visit: Payer: Self-pay

## 2021-07-29 DIAGNOSIS — D0512 Intraductal carcinoma in situ of left breast: Secondary | ICD-10-CM

## 2021-07-30 ENCOUNTER — Inpatient Hospital Stay: Payer: No Typology Code available for payment source | Attending: Adult Health

## 2021-07-30 ENCOUNTER — Other Ambulatory Visit: Payer: Self-pay

## 2021-07-30 DIAGNOSIS — G4700 Insomnia, unspecified: Secondary | ICD-10-CM | POA: Diagnosis not present

## 2021-07-30 DIAGNOSIS — Z7951 Long term (current) use of inhaled steroids: Secondary | ICD-10-CM | POA: Insufficient documentation

## 2021-07-30 DIAGNOSIS — Z923 Personal history of irradiation: Secondary | ICD-10-CM | POA: Diagnosis not present

## 2021-07-30 DIAGNOSIS — N951 Menopausal and female climacteric states: Secondary | ICD-10-CM | POA: Diagnosis not present

## 2021-07-30 DIAGNOSIS — Z79810 Long term (current) use of selective estrogen receptor modulators (SERMs): Secondary | ICD-10-CM | POA: Insufficient documentation

## 2021-07-30 DIAGNOSIS — R059 Cough, unspecified: Secondary | ICD-10-CM | POA: Insufficient documentation

## 2021-07-30 DIAGNOSIS — Z79899 Other long term (current) drug therapy: Secondary | ICD-10-CM | POA: Diagnosis not present

## 2021-07-30 DIAGNOSIS — D0512 Intraductal carcinoma in situ of left breast: Secondary | ICD-10-CM | POA: Diagnosis not present

## 2021-07-30 LAB — CBC WITH DIFFERENTIAL (CANCER CENTER ONLY)
Abs Immature Granulocytes: 0.01 10*3/uL (ref 0.00–0.07)
Basophils Absolute: 0 10*3/uL (ref 0.0–0.1)
Basophils Relative: 1 %
Eosinophils Absolute: 0.1 10*3/uL (ref 0.0–0.5)
Eosinophils Relative: 2 %
HCT: 35.2 % — ABNORMAL LOW (ref 36.0–46.0)
Hemoglobin: 11.6 g/dL — ABNORMAL LOW (ref 12.0–15.0)
Immature Granulocytes: 0 %
Lymphocytes Relative: 35 %
Lymphs Abs: 1.2 10*3/uL (ref 0.7–4.0)
MCH: 26.4 pg (ref 26.0–34.0)
MCHC: 33 g/dL (ref 30.0–36.0)
MCV: 80.2 fL (ref 80.0–100.0)
Monocytes Absolute: 0.4 10*3/uL (ref 0.1–1.0)
Monocytes Relative: 11 %
Neutro Abs: 1.7 10*3/uL (ref 1.7–7.7)
Neutrophils Relative %: 51 %
Platelet Count: 201 10*3/uL (ref 150–400)
RBC: 4.39 MIL/uL (ref 3.87–5.11)
RDW: 14.3 % (ref 11.5–15.5)
WBC Count: 3.4 10*3/uL — ABNORMAL LOW (ref 4.0–10.5)
nRBC: 0 % (ref 0.0–0.2)

## 2021-07-30 LAB — CMP (CANCER CENTER ONLY)
ALT: 9 U/L (ref 0–44)
AST: 13 U/L — ABNORMAL LOW (ref 15–41)
Albumin: 3.8 g/dL (ref 3.5–5.0)
Alkaline Phosphatase: 66 U/L (ref 38–126)
Anion gap: 10 (ref 5–15)
BUN: 12 mg/dL (ref 6–20)
CO2: 29 mmol/L (ref 22–32)
Calcium: 9.4 mg/dL (ref 8.9–10.3)
Chloride: 104 mmol/L (ref 98–111)
Creatinine: 0.9 mg/dL (ref 0.44–1.00)
GFR, Estimated: >60 mL/min (ref >60)
Glucose, Bld: 106 mg/dL — ABNORMAL HIGH (ref 70–99)
Potassium: 3.8 mmol/L (ref 3.5–5.1)
Sodium: 143 mmol/L (ref 135–145)
Total Bilirubin: 0.3 mg/dL (ref 0.3–1.2)
Total Protein: 7.4 g/dL (ref 6.5–8.1)

## 2021-07-30 NOTE — Progress Notes (Signed)
Patient Care Team: Vivi Barrack, MD as PCP - General (Family Medicine) Rolm Bookbinder, MD as Consulting Physician (General Surgery) Nicholas Lose, MD as Consulting Physician (Hematology and Oncology) Kyung Rudd, MD as Consulting Physician (Radiation Oncology) Azucena Fallen, MD as Consulting Physician (Obstetrics and Gynecology)  DIAGNOSIS:    ICD-10-CM   1. Lung nodule, solitary  R91.1 CT Chest W Contrast    2. Ductal carcinoma in situ (DCIS) of left breast  D05.12       SUMMARY OF ONCOLOGIC HISTORY: Oncology History  Ductal carcinoma in situ (DCIS) of left breast  02/08/2020 Initial Diagnosis   Screening mammogram detected left breast calcifications, 2.9cm. Biopsy showed DCIS with calcifications, low grade, ER+ 70%, PR+ 90%.   02/20/2020 Genetic Testing   Negative genetic testing:  No pathogenic variants detected on the [facility] Breast Cancer STAT or Multi-Cancer Panels. The report date is 02/20/2020.  The Breast Cancer STAT Panel offered by [facility] includes sequencing and deletion/duplication analysis for the following 9 genes:  ATM, BRCA1, BRCA2, CDH1, CHEK2, PALB2, PTEN, STK11 and TP53.  The Multi-Cancer Panel offered by [facility] includes sequencing and/or deletion duplication testing of the following 85 genes: AIP, ALK, APC, ATM, AXIN2,BAP1,  BARD1, BLM, BMPR1A, BRCA1, BRCA2, BRIP1, CASR, CDC73, CDH1, CDK4, CDKN1B, CDKN1C, CDKN2A (p14ARF), CDKN2A (p16INK4a), CEBPA, CHEK2, CTNNA1, DICER1, DIS3L2, EGFR (c.2369C>T, p.Thr790Met variant only), EPCAM (Deletion/duplication testing only), FH, FLCN, GATA2, GPC3, GREM1 (Promoter region deletion/duplication testing only), HOXB13 (c.251G>A, p.Gly84Glu), HRAS, KIT, MAX, MEN1, MET, MITF (c.952G>A, p.Glu318Lys variant only), MLH1, MSH2, MSH3, MSH6, MUTYH, NBN, NF1, NF2, NTHL1, PALB2, PDGFRA, PHOX2B, PMS2, POLD1, POLE, POT1, PRKAR1A, PTCH1, PTEN, RAD50, RAD51C, RAD51D, RB1, RECQL4, RET, RNF43, RUNX1, SDHAF2, SDHA (sequence changes only), SDHB,  SDHC, SDHD, SMAD4, SMARCA4, SMARCB1, SMARCE1, STK11, SUFU, TERC, TERT, TMEM127, TP53, TSC1, TSC2, VHL, WRN and WT1.   03/12/2020 Surgery   Left lumpectomy Donne Hazel) [phone]): flat epithelial atypia with calcifications. Negative margins. No regional lymph nodes were examined. ER/PR +.   04/23/2020 - 06/07/2020 Radiation Therapy   The patient initially received a dose of 50.4 Gy in 28 fractions to the breast using whole-breast tangent fields. This was delivered using a 3-D conformal technique. The pt received a boost delivering an additional 10 Gy in 5 fractions using a electron boost with 48mV electrons. The total dose was 60.4 Gy.   06/2020 - 06/2025 Anti-estrogen oral therapy   Tamoxifen     CHIEF COMPLIANT: Follow-up of left breast DCIS on tamoxifen  INTERVAL HISTORY: Victoria NIEMEIERis a 45y.o. with above-mentioned history of left breast DCIS who underwent a left lumpectomy, radiation, and is currently on antiestrogen therapy with tamoxifen. She presents to the clinic today for follow-up.  She reports to me that the hot flashes are stable and that the joint stiffness is also improved.  She continues to have troubles with her upper back.  She has a chronic dry cough which is not getting any better.  [place]  ALLERGIES:  is allergic to trazodone and nefazodone and zoloft [sertraline hcl].  MEDICATIONS:  Current Outpatient Medications  Medication Sig Dispense Refill   albuterol (VENTOLIN HFA) 108 (90 Base) MCG/ACT inhaler INHALE 1-2 PUFFS INTO THE LUNGS EVERY 6 HOURS AS NEEDED FOR WHEEZING OR SHORTNESS OF BREATH. 18 g 1   diphenhydrAMINE (BENADRYL) 25 mg capsule Take 25 mg by mouth at bedtime as needed.     fluticasone (  FLOVENT HFA) 110 MCG/ACT inhaler INHALE 2 PUFFS INTO THE LUNGS IN THE MORNING AND AT BEDTIME. 12 g 12   pantoprazole (PROTONIX) 40 MG tablet Take 1 tablet (40 mg total) by mouth daily.  30 tablet 6   tamoxifen (NOLVADEX) 20 MG tablet TAKE 1 TABLET BY MOUTH ONCE DAILY 90 tablet 3   tiZANidine (ZANAFLEX) 2 MG tablet Take 1-2 tablets by mouth every 8 hours as needed for muscle spasms. 60 tablet 1   venlafaxine XR (EFFEXOR-XR) 75 MG 24 hr capsule Take 1 capsule (75 mg total) by mouth daily with breakfast. 90 capsule 3   verapamil (CALAN-SR) 180 MG CR tablet TAKE 1 TABLET BY MOUTH EVERY MORNING 90 tablet 0   No current facility-administered medications for this visit.    PHYSICAL EXAMINATION: ECOG PERFORMANCE STATUS: 1 - Symptomatic but completely ambulatory  Vitals:   07/31/21 1012  BP: 130/69  Pulse: 64  Resp: 18  Temp: 97.7 F (36.5 C)  SpO2: 99%   Filed Weights   07/31/21 1012  Weight: 126 lb 14.4 oz (57.6 kg)      LABORATORY DATA:  I have reviewed the data as listed CMP Latest Ref Rng & Units 07/30/2021 07/01/2021 01/21/2021  Glucose 70 - 99 mg/dL 106(H) - 97  BUN 6 - 20 mg/dL 12 9 16   Creatinine 0.44 - 1.00 mg/dL 0.90 0.7 0.84  Sodium 135 - 145 mmol/L 143 142 138  Potassium 3.5 - 5.1 mmol/L 3.8 4.1 3.5  Chloride 98 - 111 mmol/L 104 102 103  CO2 22 - 32 mmol/L 29 27(A) 21(L)  Calcium 8.9 - 10.3 mg/dL 9.4 9.5 9.3  Total Protein 6.5 - 8.1 g/dL 7.4 - 7.4  Total Bilirubin 0.3 - 1.2 mg/dL 0.3 - 0.3  Alkaline Phos 38 - 126 U/L 66 83 63  AST 15 - 41 U/L 13(L) 21 18  ALT 0 - 44 U/L 9 16 15     Lab Results  Component Value Date   WBC 3.4 (L) 07/30/2021   HGB 11.6 (L) 07/30/2021   HCT 35.2 (L) 07/30/2021   MCV 80.2 07/30/2021   PLT 201 07/30/2021   NEUTROABS 1.7 07/30/2021    ASSESSMENT & PLAN:  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 Biopsy showed DCIS with calcifications, low grade, ER+ 70%, PR+ 90%.  Additional biopsy: Flat epithelial atypia which needs excision Tis NX stage 0   03/12/2020: Left lumpectomy: Flat epithelial atypia radial scar uninvolved by DCIS. Adjuvant  radiation therapy completed 06/06/2020   Current treatment: antiestrogen therapy with tamoxifen 5 years started July 2021 Tamoxifen toxicities: 1.  Hot flashes and mood swings: Currently on Effexor.   Chronic dry cough: Uncertain etiology.  She is taking antacid medications.  I have looked at all of her medications and could not find anything that increases cough risk..   Insomnia: I discussed with her extensively about all the things that she needs to do to help sleep better.   CT chest March 2022: 3 mm lung nodule: I discussed with her that it appears to be benign  We will obtain a CT chest in March 2023.   Breast cancer surveillance: 1.  Breast exam 01/27/2021: Benign 2. Mammogram 02/10/2021: 2.9 cm group of calcifications adjacent to the lumpectomy site.  1 cm group of calcifications posterior central right breast: Biopsy benign   Return to clinic in 1 year for follow-up but will call her after the CT scan to discuss  results    Orders Placed This Encounter  Procedures   CT Chest W Contrast    Standing Status:   Future    Standing Expiration Date:   07/31/2022    Order Specific Question:   If indicated for the ordered procedure, I authorize the administration of contrast media per Radiology protocol    Answer:   Yes    Order Specific Question:   Is patient pregnant?    Answer:   No    Order Specific Question:   Preferred imaging location?    Answer:   [facility]    Order Specific Question:   Release to patient    Answer:   Immediate   The patient has a good understanding of the overall plan. she agrees with it. she will call with any problems that may develop before the next visit here.  Total time spent: 20 mins including face to face time and time spent for planning, charting and coordination of care  Rulon Eisenmenger, MD, MPH 07/31/2021  I, Thana Ates, am acting as scribe for Dr. Nicholas Lose.  I have reviewed the above documentation for accuracy and  completeness, and I agree with the above.

## 2021-07-31 ENCOUNTER — Encounter: Payer: Self-pay | Admitting: Physical Therapy

## 2021-07-31 ENCOUNTER — Inpatient Hospital Stay (HOSPITAL_BASED_OUTPATIENT_CLINIC_OR_DEPARTMENT_OTHER): Payer: No Typology Code available for payment source | Admitting: Hematology and Oncology

## 2021-07-31 ENCOUNTER — Ambulatory Visit (INDEPENDENT_AMBULATORY_CARE_PROVIDER_SITE_OTHER): Payer: No Typology Code available for payment source | Admitting: Physical Therapy

## 2021-07-31 ENCOUNTER — Other Ambulatory Visit: Payer: Self-pay

## 2021-07-31 VITALS — BP 130/69 | HR 64 | Temp 97.7°F | Resp 18 | Ht 63.0 in | Wt 126.9 lb

## 2021-07-31 DIAGNOSIS — R911 Solitary pulmonary nodule: Secondary | ICD-10-CM

## 2021-07-31 DIAGNOSIS — M542 Cervicalgia: Secondary | ICD-10-CM | POA: Diagnosis not present

## 2021-07-31 DIAGNOSIS — D0512 Intraductal carcinoma in situ of left breast: Secondary | ICD-10-CM

## 2021-07-31 DIAGNOSIS — M62838 Other muscle spasm: Secondary | ICD-10-CM

## 2021-07-31 NOTE — Assessment & Plan Note (Signed)
02/08/2020:Screening mammogram detected left breast calcifications, 2.9cm. 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 Adjuvant radiation therapy completed 06/06/2020  Current treatment:antiestrogen therapy with tamoxifen 5 years started July 2021 Tamoxifen toxicities: 1.Hot flashes and mood swings: Currently on increased dosage of Effexor.  She only started the 75 mg dose a week ago and has not seen any major difference.  Insomnia: I discussed with her extensively about all the things that she needs to do to help sleep better.  CT chest March 2022: 3 mm lung nodule: I discussed with her that it appears to be benign    Breast cancer surveillance: 1.Breast exam 01/27/2021: Benign 2.Mammogram 02/10/2021: 2.9 cm group of calcifications adjacent to the lumpectomy site.  1 cm group of calcifications posterior central right breast: Biopsy benign  Return to clinic in 1 year for follow-up

## 2021-08-03 ENCOUNTER — Encounter: Payer: Self-pay | Admitting: Physical Therapy

## 2021-08-03 NOTE — Therapy (Signed)
[facility] [address] [place], [place], [address] Phone: [phone]   Fax:  [phone]  Physical Therapy Evaluation  Patient Details  Name: Victoria Maddox MRN: [id number] Date of Birth: [birth year] Referring Provider (PT): Lynne Leader   Encounter Date: 07/31/2021   PT End of Session - 08/03/21 0911     Visit Number 1    Number of Visits 12    Date for PT Re-Evaluation 09/11/21    Authorization Type [facility]    PT Start Time 1345    PT Stop Time 1425    PT Time Calculation (min) 40 min    Activity Tolerance Patient tolerated treatment well    Behavior During Therapy [facility] for tasks assessed/performed             Past Medical History:  Diagnosis Date   Allergy    Depression    Family history of breast cancer    Family history of kidney cancer    Family history of throat cancer    Frequent headaches    Heart murmur    History of recurrent UTIs    Hypertension    Migraines    Personal history of radiation therapy    Urinary incontinence     Past Surgical History:  Procedure Laterality Date   BREAST BIOPSY Left 01/29/2020   BREAST BIOPSY Left 02/06/2020   BREAST LUMPECTOMY Left 03/12/2020   BREAST LUMPECTOMY WITH RADIOACTIVE SEED LOCALIZATION Left 03/12/2020   Procedure: LEFT BREAST LUMPECTOMY WITH BRACKETED RADIOACTIVE SEED LOCALIZATION;  Surgeon: Rolm Bookbinder, MD;  Location: [place];  Service: General;  Laterality: Left;   DILATION AND CURETTAGE OF UTERUS      There were no vitals filed for this visit.    Subjective Assessment - 08/03/21 0901     Subjective Pt sates ongoing pain in shoulder/UT region. 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 Also has trouble sleeping.    Pertinent History breast  cancer 2021    Limitations House hold activities;Lifting    Patient Stated Goals decreased tightness and pain    Currently in Pain? Yes    Pain Score 8     Pain Location  Neck    Pain Orientation Left;Right    Pain Descriptors / Indicators Aching    Pain Type Chronic pain    Pain Onset More than a month ago    Pain Frequency Intermittent    Aggravating Factors  heat, massage, stretching    Pain Relieving Factors housework, work duties, lifting.                [facility] PT Assessment - 08/03/21 0001       Assessment   Medical Diagnosis Cervcial pain    Referring Provider (PT) Lynne Leader    Hand Dominance Right    Prior Therapy no      Precautions   Precautions None      Balance Screen   Has the patient fallen in the past 6 months No      Prior Function   Level of Independence Independent      Cognition   Overall Cognitive Status Within Functional Limits for tasks assessed      AROM   Overall AROM Comments Cervical: WNL, shoulder: WNL      Strength   Overall Strength Comments UE: 4/5, scapular: 4-/5      Palpation   Palpation comment Tightness and trigger points in bil UT,  levator, into L rhomoboid and thoracic paraspinals.                        Objective measurements completed on examination: See above findings.       [facility] Adult PT Treatment/Exercise - 08/03/21 0001       Exercises   Exercises Neck      Neck Exercises: Seated   Shoulder Rolls 20 reps    Other Seated Exercise Scap retract, x 15;      Neck Exercises: Stretches   Upper Trapezius Stretch 3 reps;30 seconds    Levator Stretch 3 reps;30 seconds                     PT Education - 08/03/21 0910     Education Details PT POC, exam findings, HEP, [place],    Person(s) Educated Patient    Methods Explanation;Demonstration;Tactile cues;Verbal cues;Handout    Comprehension Verbalized understanding;Returned demonstration;Tactile cues required;Need further instruction;Verbal cues required              PT Short Term Goals - 08/03/21 0929       PT SHORT TERM GOAL #1   Title Pt to be indepndent with initial HEP    Time 2     Period Weeks    Status New    Target Date 08/15/21               PT Long Term Goals - 08/03/21 0929       PT LONG TERM GOAL #1   Title Pt to be independent with final HEP    Time 6    Period Weeks    Status New    Target Date 09/12/21      PT LONG TERM GOAL #2   Title Pt to report decreased pain in neck to 0-2/10 with work and activities.    Time 6    Period Weeks    Status New    Target Date 09/12/21      PT LONG TERM GOAL #3   Title Pt to demo improved soft tissue limitations to be WNL in shoulders and neck region.    Time 6    Period Weeks    Status New    Target Date 09/12/21      PT LONG TERM GOAL #4   Title Pt to demo optimal posture in sititng and standing, to improve muscle tension and pain with activity and work duties.    Time 6    Period Weeks    Status New    Target Date 09/12/21                    Plan - 08/03/21 0933     Clinical Impression Statement Pt presents with primary complaint of increased pain in neck and shoulder musculature. She has increased muscle tension and trigger points in UT, levator, and into thoracic paraspinals and rhomboid. She has poor seated posture and poor posture with work duties. Pt with lack of effective HEP for her diagnosis. Pt with decreased ability for full funcitonal activities, work duties, and IADLs, due to pain Pt to benefit from skilled PT to improve pain and function.    Personal Factors and Comorbidities Time since onset of injury/illness/exacerbation    Examination-Activity Limitations Sit;Bend;Carry;Reach Overhead;Lift    Examination-Participation Restrictions Cleaning;Community Activity;Shop;Driving;Meal Prep;Occupation    Stability/Clinical Decision Making Stable/Uncomplicated    Designer, [employer]    Rehab  Potential Good    PT Frequency 2x / week    PT Duration 6 weeks    PT Treatment/Interventions ADLs/Self Care Home Management;Cryotherapy;Functional mobility training;Electrical  Stimulation;Iontophoresis 4mg /ml Dexamethasone;Moist Heat;Traction;Therapeutic exercise;Therapeutic activities;DME Instruction;Ultrasound;Neuromuscular re-education;Patient/family education;Manual techniques;Taping;Dry needling;Passive range of motion;Visual/perceptual remediation/compensation;Joint Manipulations;Spinal Manipulations    PT Home Exercise Plan [email or web address]    Consulted and Agree with Plan of Care Patient             Patient will benefit from skilled therapeutic intervention in order to improve the following deficits and impairments:  Decreased activity tolerance, Decreased strength, Pain, Increased muscle spasms, Decreased mobility, Decreased range of motion, Improper body mechanics, Impaired flexibility  Visit Diagnosis: Cervicalgia  Other muscle spasm     Problem List 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   HTN (hypertension) 05/04/2018   Insomnia 05/04/2018   Adjustment disorder with mixed anxiety and depressed mood 05/04/2018    Lyndee Hensen, PT, DPT 9:49 AM  08/03/21    [facility] [address] [place], [place], [address] Phone: [phone]   Fax:  [phone]  Name: JANYE MAYNOR MRN: [id number] Date of Birth: 04/15/1976

## 2021-08-03 NOTE — Patient Instructions (Signed)
Access Code: [email or web address] URL: [email or web address] Date: 08/03/2021 Prepared by: Lyndee Hensen  Exercises Seated Upper Trapezius Stretch - 2 x daily - 3 reps - 30 hold Seated Levator Scapulae Stretch - 2 x daily - 3 reps - 30 hold Seated Scapular Retraction - 2 x daily - 1 sets - 10 reps Standing Backward Shoulder Rolls - 2 x daily - 1 sets - 10 reps

## 2021-08-14 ENCOUNTER — Other Ambulatory Visit (HOSPITAL_COMMUNITY): Payer: Self-pay

## 2021-08-14 ENCOUNTER — Encounter: Payer: No Typology Code available for payment source | Admitting: Physical Therapy

## 2021-08-15 ENCOUNTER — Other Ambulatory Visit: Payer: Self-pay

## 2021-08-15 ENCOUNTER — Other Ambulatory Visit (HOSPITAL_COMMUNITY): Payer: Self-pay

## 2021-08-15 ENCOUNTER — Ambulatory Visit (INDEPENDENT_AMBULATORY_CARE_PROVIDER_SITE_OTHER): Payer: No Typology Code available for payment source | Admitting: Physical Therapy

## 2021-08-15 DIAGNOSIS — M62838 Other muscle spasm: Secondary | ICD-10-CM | POA: Diagnosis not present

## 2021-08-15 DIAGNOSIS — M542 Cervicalgia: Secondary | ICD-10-CM | POA: Diagnosis not present

## 2021-08-17 ENCOUNTER — Encounter: Payer: Self-pay | Admitting: Physical Therapy

## 2021-08-17 NOTE — Therapy (Signed)
[facility] [address] [place], [place], [address] Phone: [phone]   Fax:  [phone]  Physical Therapy Treatment  Patient Details  Name: Victoria Maddox MRN: [id number] Date of Birth: 09/05/1976 Referring Provider (PT): Lynne Leader   Encounter Date: 08/15/2021   PT End of Session - 08/17/21 1112     Visit Number 2    Number of Visits 12    Date for PT Re-Evaluation 09/11/21    Authorization Type [facility]    PT Start Time 9371    PT Stop Time 1315    PT Time Calculation (min) 40 min    Activity Tolerance Patient tolerated treatment well    Behavior During Therapy [facility] for tasks assessed/performed             Past Medical History:  Diagnosis Date   Allergy    Depression    Family history of breast cancer    Family history of kidney cancer    Family history of throat cancer    Frequent headaches    Heart murmur    History of recurrent UTIs    Hypertension    Migraines    Personal history of radiation therapy    Urinary incontinence     Past Surgical History:  Procedure Laterality Date   BREAST BIOPSY Left 01/29/2020   BREAST BIOPSY Left 02/06/2020   BREAST LUMPECTOMY Left 03/12/2020   BREAST LUMPECTOMY WITH RADIOACTIVE SEED LOCALIZATION Left 03/12/2020   Procedure: LEFT BREAST LUMPECTOMY WITH BRACKETED RADIOACTIVE SEED LOCALIZATION;  Surgeon: Rolm Bookbinder, MD;  Location: [place];  Service: General;  Laterality: Left;   DILATION AND CURETTAGE OF UTERUS      There were no vitals filed for this visit.   Subjective Assessment - 08/17/21 1106     Subjective Pt states continued pain    Currently in Pain? Yes    Pain Score 6     Pain Location Neck    Pain Orientation Right;Left    Pain Descriptors / Indicators Aching    Pain Type Chronic pain    Pain Onset More than a month ago    Pain Frequency Intermittent                               [facility] Adult PT  Treatment/Exercise - 08/17/21 0001       Neck Exercises: Theraband   Rows 20 reps;Green    Shoulder External Rotation 20 reps    Shoulder External Rotation Limitations YTB      Neck Exercises: Seated   Other Seated Exercise Scap retract, x 15;      Neck Exercises: Stretches   Upper Trapezius Stretch 3 reps;30 seconds    Levator Stretch 3 reps;30 seconds    Corner Stretch 3 reps;30 seconds    Corner Stretch Limitations doorway    Other Neck Stretches Supine pec stretch /manual      Modalities   Modalities Moist Heat      Moist Heat Therapy   Number Minutes Moist Heat 10 Minutes    Moist Heat Location Cervical      Manual Therapy   Manual Therapy Joint mobilization;Soft tissue mobilization;Manual Traction    Manual therapy comments skilled palpation and monitoring of soft tissue with dry needling.    Joint Mobilization Cervical PA mobs,    Soft tissue mobilization STM/DTM to bil UTs, cervical paraspinals, rhomboids    Manual  Traction Light cervical distraction 10 sec x 10;              Trigger Point Dry Needling - 08/17/21 0001     Consent Given? Yes    Education Handout Provided Yes    Muscles Treated Head and Neck Upper trapezius;Levator scapulae    Upper Trapezius Response Twitch reponse elicited;Palpable increased muscle length   L and R   Levator Scapulae Response Twitch response elicited;Palpable increased muscle length   L and R                    PT Short Term Goals - 08/03/21 0929       PT SHORT TERM GOAL #1   Title Pt to be indepndent with initial HEP    Time 2    Period Weeks    Status New    Target Date 08/15/21               PT Long Term Goals - 08/03/21 0929       PT LONG TERM GOAL #1   Title Pt to be independent with final HEP    Time 6    Period Weeks    Status New    Target Date 09/12/21      PT LONG TERM GOAL #2   Title Pt to report decreased pain in neck to 0-2/10 with work and activities.    Time 6    Period  Weeks    Status New    Target Date 09/12/21      PT LONG TERM GOAL #3   Title Pt to demo improved soft tissue limitations to be WNL in shoulders and neck region.    Time 6    Period Weeks    Status New    Target Date 09/12/21      PT LONG TERM GOAL #4   Title Pt to demo optimal posture in sititng and standing, to improve muscle tension and pain with activity and work duties.    Time 6    Period Weeks    Status New    Target Date 09/12/21                   Plan - 08/17/21 1113     Clinical Impression Statement Pt with significant muscle tension in UT region. Addressed with manual and dry needling today. Pt with good tolerance, may benefit from future sessions. Heat given at end of session. Ther ex for postural strengthening. Pt to benefit from continued manual and ther ex for pain.    Personal Factors and Comorbidities Time since onset of injury/illness/exacerbation    Examination-Activity Limitations Sit;Bend;Carry;Reach Overhead;Lift    Examination-Participation Restrictions Cleaning;Community Activity;Shop;Driving;Meal Prep;Occupation    Stability/Clinical Decision Making Stable/Uncomplicated    Rehab Potential Good    PT Frequency 2x / week    PT Duration 6 weeks    PT Treatment/Interventions ADLs/Self Care Home Management;Cryotherapy;Functional mobility training;Electrical Stimulation;Iontophoresis 4mg /ml Dexamethasone;Moist Heat;Traction;Therapeutic exercise;Therapeutic activities;DME Instruction;Ultrasound;Neuromuscular re-education;Patient/family education;Manual techniques;Taping;Dry needling;Passive range of motion;Visual/perceptual remediation/compensation;Joint Manipulations;Spinal Manipulations    PT Home Exercise Plan [id number]    Consulted and Agree with Plan of Care Patient             Patient will benefit from skilled therapeutic intervention in order to improve the following deficits and impairments:  Decreased activity tolerance, Decreased strength,  Pain, Increased muscle spasms, Decreased mobility, Decreased range of motion, Improper body mechanics, Impaired flexibility  Visit Diagnosis: Cervicalgia  Other muscle spasm     Problem List 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

## 2021-08-20 ENCOUNTER — Other Ambulatory Visit: Payer: Self-pay

## 2021-08-20 ENCOUNTER — Ambulatory Visit (INDEPENDENT_AMBULATORY_CARE_PROVIDER_SITE_OTHER): Payer: No Typology Code available for payment source | Admitting: Physical Therapy

## 2021-08-20 DIAGNOSIS — M542 Cervicalgia: Secondary | ICD-10-CM | POA: Diagnosis not present

## 2021-08-20 DIAGNOSIS — M62838 Other muscle spasm: Secondary | ICD-10-CM | POA: Diagnosis not present

## 2021-08-20 NOTE — Therapy (Signed)
[facility] [address] [place], [place], [address] Phone: [phone]   Fax:  [phone]  Physical Therapy Treatment  Patient Details  Name: Victoria Maddox MRN: [id number] Date of Birth: [birth year] Referring Provider (PT): Lynne Leader   Encounter Date: 08/20/2021   PT End of Session - 08/20/21 1202     Visit Number 3    Number of Visits 12    Date for PT Re-Evaluation 09/11/21    Authorization Type [facility]    PT Start Time 1031    PT Stop Time 1100    PT Time Calculation (min) 29 min    Activity Tolerance Patient tolerated treatment well    Behavior During Therapy [facility] for tasks assessed/performed             Past Medical History:  Diagnosis Date   Allergy    Depression    Family history of breast cancer    Family history of kidney cancer    Family history of throat cancer    Frequent headaches    Heart murmur    History of recurrent UTIs    Hypertension    Migraines    Personal history of radiation therapy    Urinary incontinence     Past Surgical History:  Procedure Laterality Date   BREAST BIOPSY Left 01/29/2020   BREAST BIOPSY Left 02/06/2020   BREAST LUMPECTOMY Left 03/12/2020   BREAST LUMPECTOMY WITH RADIOACTIVE SEED LOCALIZATION Left 03/12/2020   Procedure: LEFT BREAST LUMPECTOMY WITH BRACKETED RADIOACTIVE SEED LOCALIZATION;  Surgeon: Rolm Bookbinder, MD;  Location: [place];  Service: General;  Laterality: Left;   DILATION AND CURETTAGE OF UTERUS      There were no vitals filed for this visit.   Subjective Assessment - 08/20/21 1201     Subjective Pt states quite a bit of soreness in R>L UT and levator region. Has been doing stretches.    Patient Stated Goals decreased tightness and pain    Pain Score 6     Pain Location Neck    Pain Orientation Left;Right    Pain Descriptors / Indicators Aching    Pain Type Chronic pain    Pain Onset More than a month ago    Pain  Frequency Intermittent                               [facility] Adult PT Treatment/Exercise - 08/20/21 0001       Neck Exercises: Theraband   Rows 20 reps;Green    Shoulder External Rotation 20 reps    Shoulder External Rotation Limitations YTB    Other Theraband Exercises horiz abd RTB x 10      Neck Exercises: Standing   Other Standing Exercises Standing shoulder flex and abd (to 90 deg) ; practice in mirror for decreasing UT compensation.      Neck Exercises: Stretches   Upper Trapezius Stretch 3 reps;30 seconds    Levator Stretch 3 reps;30 seconds    Corner Stretch 3 reps;30 seconds    Corner Stretch Limitations doorway      Manual Therapy   Joint Mobilization Cervical PA mobs,    Soft tissue mobilization STM/DTM to bil UTs, R levator, R rhomboid    Manual Traction Light cervical distraction 10 sec x 8;  PT Short Term Goals - 08/03/21 0929       PT SHORT TERM GOAL #1   Title Pt to be indepndent with initial HEP    Time 2    Period Weeks    Status New    Target Date 08/15/21               PT Long Term Goals - 08/03/21 0929       PT LONG TERM GOAL #1   Title Pt to be independent with final HEP    Time 6    Period Weeks    Status New    Target Date 09/12/21      PT LONG TERM GOAL #2   Title Pt to report decreased pain in neck to 0-2/10 with work and activities.    Time 6    Period Weeks    Status New    Target Date 09/12/21      PT LONG TERM GOAL #3   Title Pt to demo improved soft tissue limitations to be WNL in shoulders and neck region.    Time 6    Period Weeks    Status New    Target Date 09/12/21      PT LONG TERM GOAL #4   Title Pt to demo optimal posture in sititng and standing, to improve muscle tension and pain with activity and work duties.    Time 6    Period Weeks    Status New    Target Date 09/12/21                   Plan - 08/20/21 1203     Clinical Impression  Statement Pt with much tightness and soreness in R>L neck musculature today. Most tight in R UT, levator and rhomboid, addressed with manual and DTM for muscle release today. Pt with much difficulty with relaxing UTs with UE ROM, practice in front of mirror for decreasing compensation today. Plan to continue focus on decreasing muscle tension for pain relief.    Personal Factors and Comorbidities Time since onset of injury/illness/exacerbation    Examination-Activity Limitations Sit;Bend;Carry;Reach Overhead;Lift    Examination-Participation Restrictions Cleaning;Community Activity;Shop;Driving;Meal Prep;Occupation    Stability/Clinical Decision Making Stable/Uncomplicated    Rehab Potential Good    PT Frequency 2x / week    PT Duration 6 weeks    PT Treatment/Interventions ADLs/Self Care Home Management;Cryotherapy;Functional mobility training;Electrical Stimulation;Iontophoresis 4mg /ml Dexamethasone;Moist Heat;Traction;Therapeutic exercise;Therapeutic activities;DME Instruction;Ultrasound;Neuromuscular re-education;Patient/family education;Manual techniques;Taping;Dry needling;Passive range of motion;Visual/perceptual remediation/compensation;Joint Manipulations;Spinal Manipulations    PT Home Exercise Plan [id number]    Consulted and Agree with Plan of Care Patient             Patient will benefit from skilled therapeutic intervention in order to improve the following deficits and impairments:  Decreased activity tolerance, Decreased strength, Pain, Increased muscle spasms, Decreased mobility, Decreased range of motion, Improper body mechanics, Impaired flexibility  Visit Diagnosis: Cervicalgia  Other muscle spasm     Problem List Patient Active Problem List   Diagnosis Date Noted   Prediabetes 07/08/2021   Shoulder pain, bilateral 07/08/2021   Hypertriglyceridemia 07/08/2021   GERD (gastroesophageal reflux disease) 07/08/2021   Upper back pain 07/08/2021   Genetic testing  02/22/2020   Family history of breast cancer    Family history of throat cancer    Family history of kidney cancer    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

## 2021-08-21 ENCOUNTER — Encounter: Payer: No Typology Code available for payment source | Admitting: Physical Therapy

## 2021-08-25 ENCOUNTER — Other Ambulatory Visit: Payer: Self-pay | Admitting: Hematology and Oncology

## 2021-08-25 ENCOUNTER — Ambulatory Visit
Admission: RE | Admit: 2021-08-25 | Discharge: 2021-08-25 | Disposition: A | Discharge status: Home / Self Care | Payer: No Typology Code available for payment source | Source: Ambulatory Visit | Attending: Hematology and Oncology | Admitting: Hematology and Oncology

## 2021-08-25 ENCOUNTER — Other Ambulatory Visit: Payer: Self-pay

## 2021-08-25 DIAGNOSIS — D0512 Intraductal carcinoma in situ of left breast: Secondary | ICD-10-CM

## 2021-08-25 IMAGING — MG DIGITAL DIAGNOSTIC BILAT W/ TOMO W/ CAD
8 of 16 series · 8 of 32 positions shown · non-contrast
Comparison: Previous exam(s).
COMPARISON: Previous exam(s).

Addendum:
CLINICAL DATA: 45-year-old female for six-month follow-up of RIGHT
breast calcifications. History of LEFT breast DCIS and lumpectomy in
[AY].

EXAM:
DIGITAL DIAGNOSTIC BILATERAL MAMMOGRAM WITH CAD AND TOMO

[L ML]
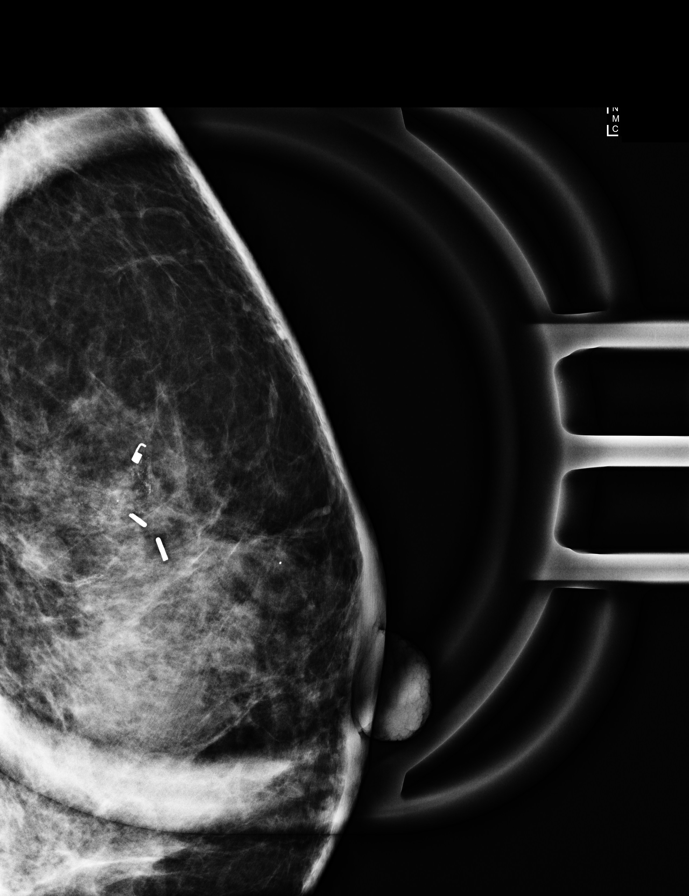

[L MLO]
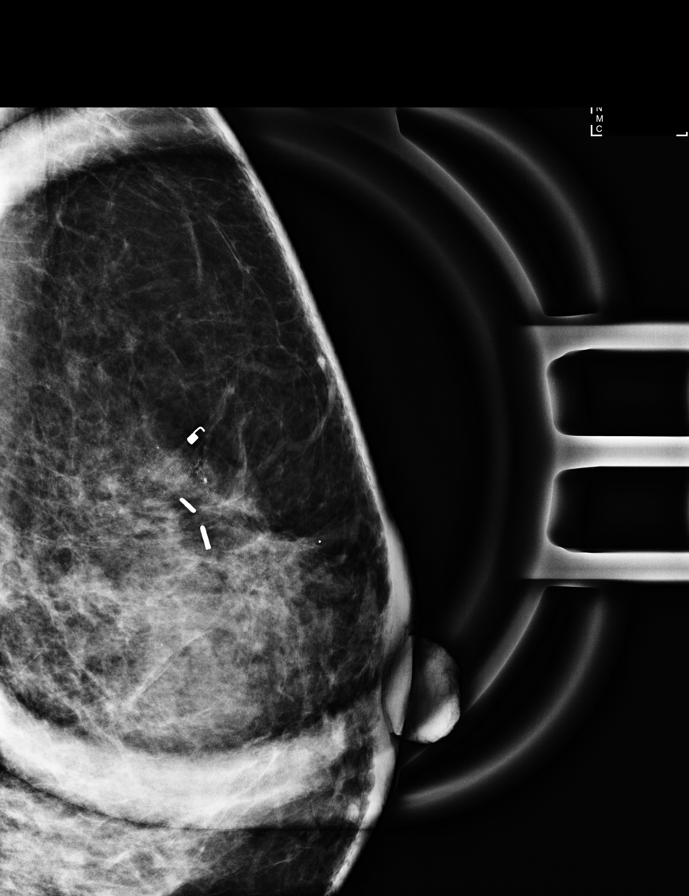

[R ML (1 of 2)]
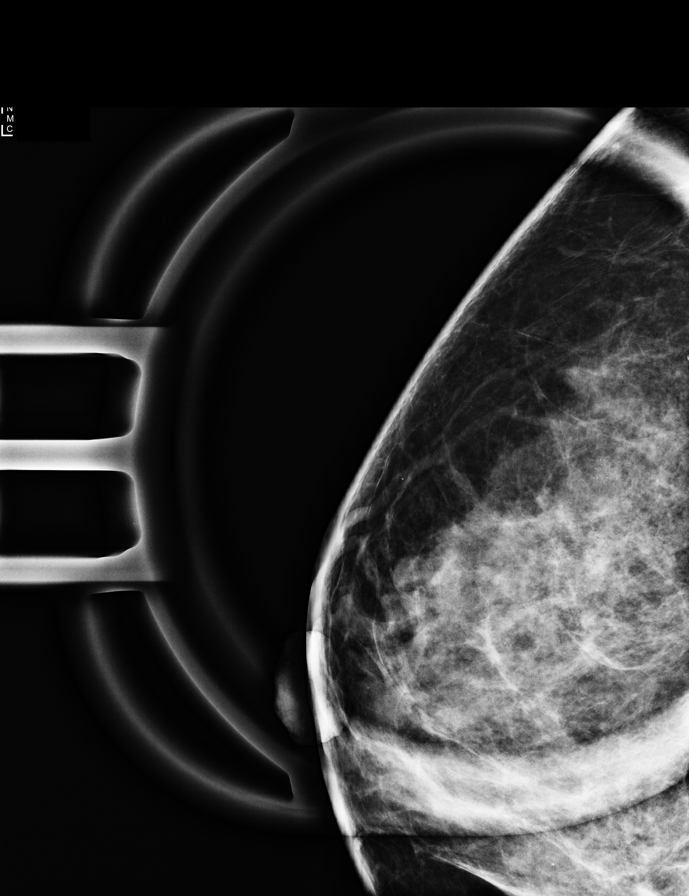

[L CC]
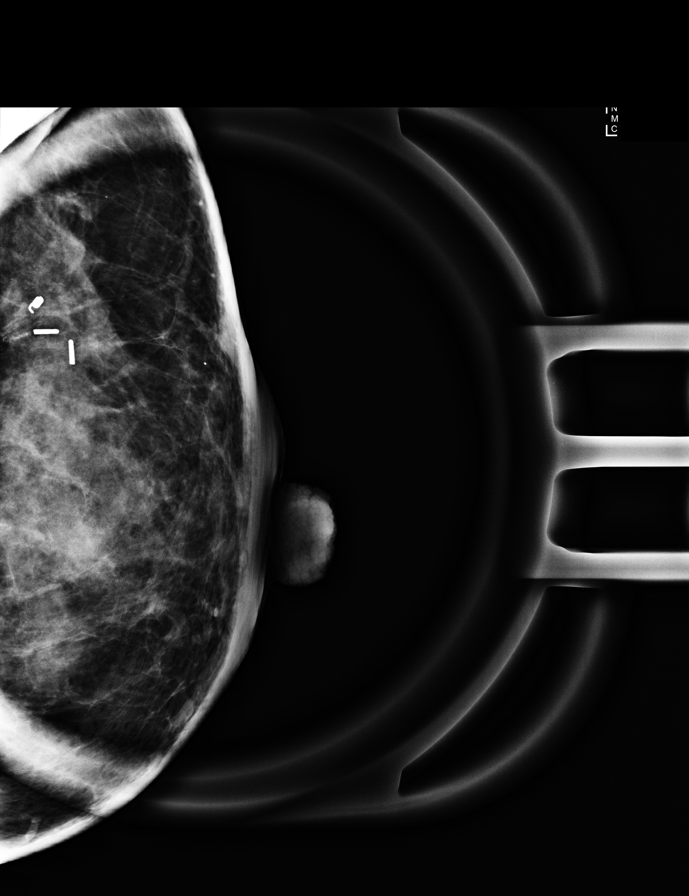

[R ML (2 of 2)]
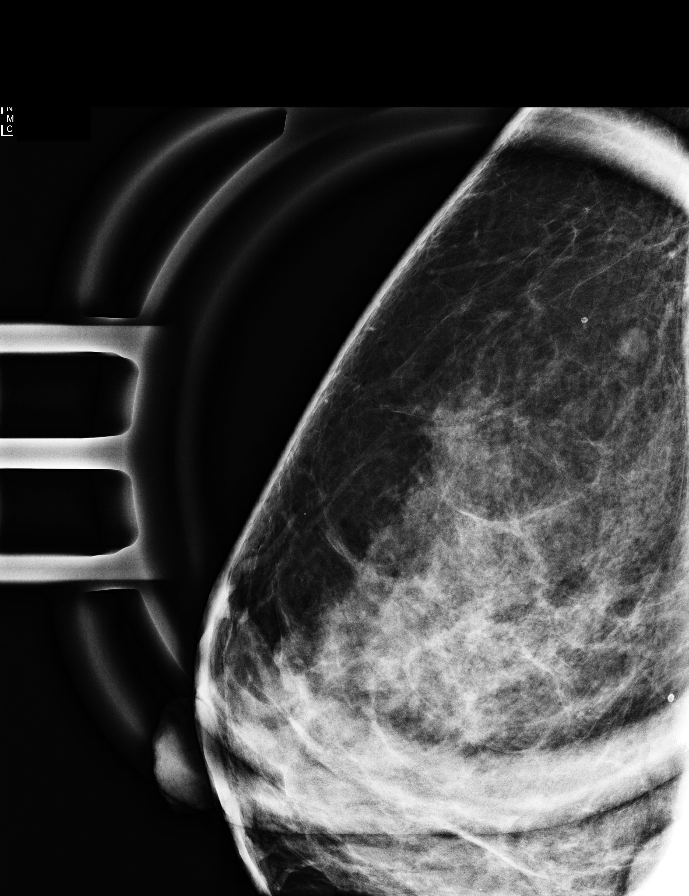

[R CC (1 of 3)]
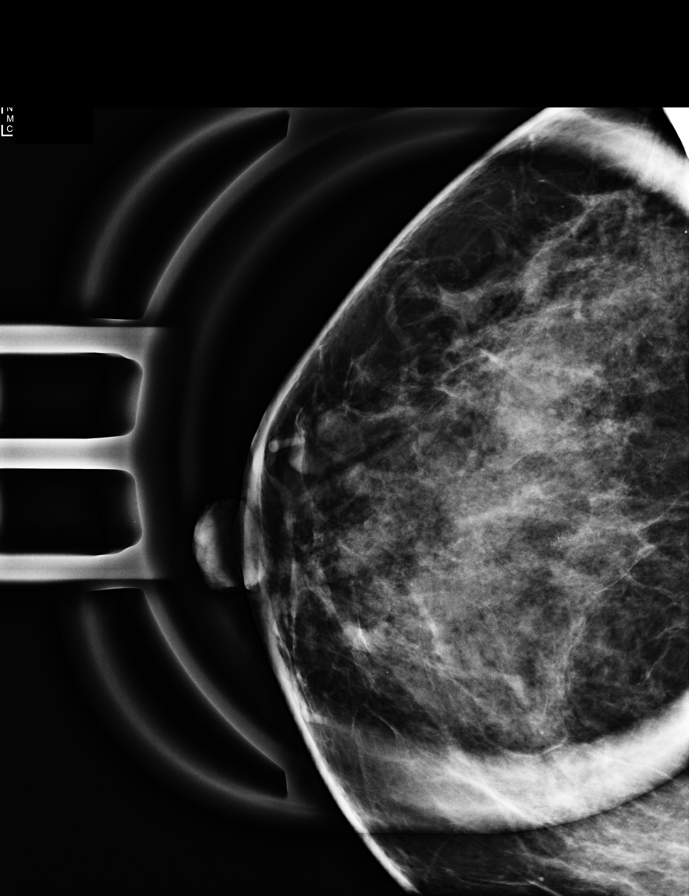

[R CC (2 of 3)]
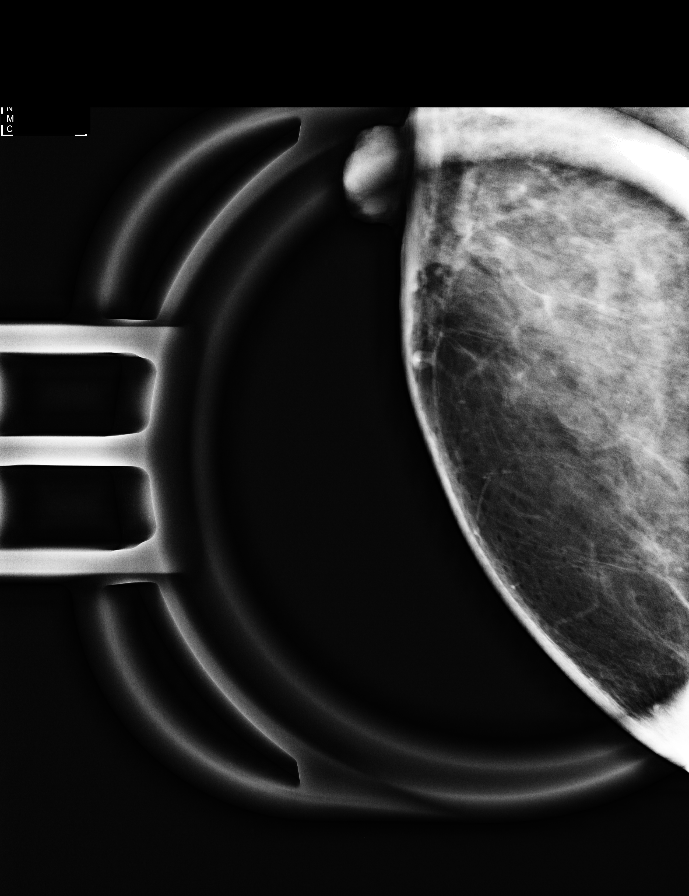

[R CC (3 of 3)]
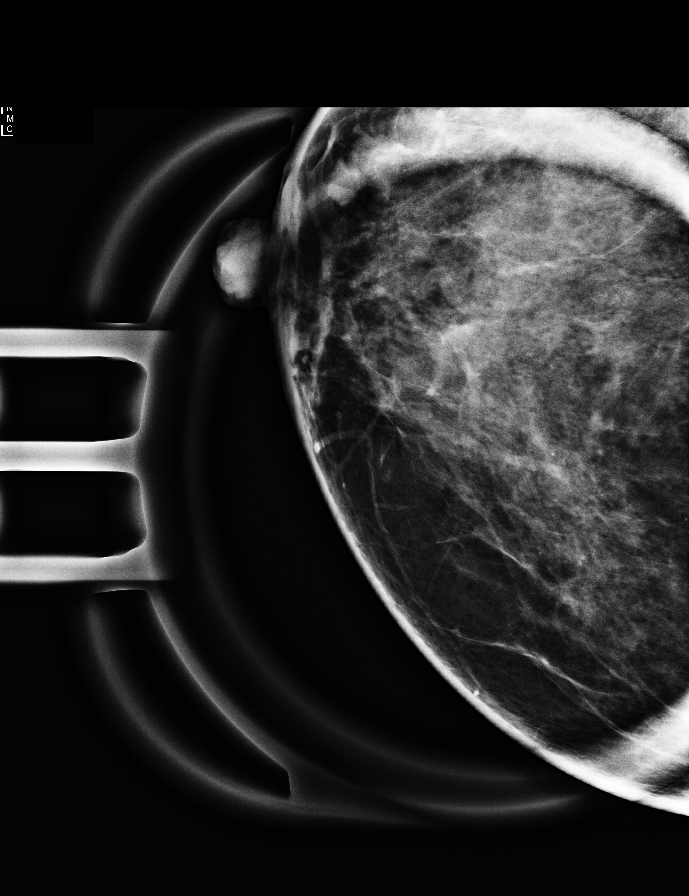

[8 of 32 positions shown; findings below may reference images not displayed]

ACR Breast Density Category c: The breast tissue is heterogeneously
dense, which may obscure small masses.
FINDINGS: Full field and magnification views of both breasts are performed.

Primarily punctate calcifications within the posterior central/UPPER
RIGHT breast are not significantly changed.

No new or suspicious findings are noted within either breast.

Lumpectomy changes with adjacent calcifications are again
identified.

Mammographic images were processed with CAD.
IMPRESSION: 1. Likely benign RIGHT breast calcifications. Six-month follow-up
recommended to ensure 1 year stability.
2. No new or suspicious findings within either breast.

RECOMMENDATION:
At bilateral diagnostic mammogram in 1 year.

I have discussed the findings and recommendations with the patient.
If applicable, a reminder letter will be sent to the patient
regarding the next appointment.

BI-RADS CATEGORY  3: Probably benign.

ADDENDUM:
RECOMMENDATION should read bilateral diagnostic mammogram in 6
months.

*** End of Addendum ***
ACR Breast Density Category c: The breast tissue is heterogeneously
dense, which may obscure small masses.
FINDINGS: Full field and magnification views of both breasts are performed.

Primarily punctate calcifications within the posterior central/UPPER
RIGHT breast are not significantly changed.

No new or suspicious findings are noted within either breast.

Lumpectomy changes with adjacent calcifications are again
identified.

Mammographic images were processed with CAD.
IMPRESSION: 1. Likely benign RIGHT breast calcifications. Six-month follow-up
recommended to ensure 1 year stability.
2. No new or suspicious findings within either breast.

RECOMMENDATION:
At bilateral diagnostic mammogram in 1 year.

I have discussed the findings and recommendations with the patient.
If applicable, a reminder letter will be sent to the patient
regarding the next appointment.

BI-RADS CATEGORY  3: Probably benign.

## 2021-08-26 ENCOUNTER — Encounter: Payer: No Typology Code available for payment source | Admitting: Physical Therapy

## 2021-08-27 NOTE — Progress Notes (Deleted)
   I, Wendy Poet, LAT, ATC, am serving as scribe for Dr. Lynne Leader.  Victoria Maddox is a 45 y.o. female who presents to [place] at [facility] today for f/u of chronic B shoulder and upper back pain.  She was last seen by Dr. Georgina Snell on 07/17/21 and was referred to PT of which she's completed 3 sessions.  She was also prescribed Tizanidine and advised to use heat.  Today, pt reports   Diagnostic imaging: C-spine XR- 07/17/21  Pertinent review of systems: ***  Relevant historical information: ***   Exam:  There were no vitals taken for this visit. General: Well Developed, well nourished, and in no acute distress.   MSK: ***    Lab and Radiology Results No results found for this or any previous visit (from the past 72 hour(s)). MM DIAG BREAST TOMO BILATERAL  Addendum Date: 08/26/2021   ADDENDUM REPORT: 08/26/2021 09:04 ADDENDUM: RECOMMENDATION should read bilateral diagnostic mammogram in 6 months. Electronically Signed   By: Margarette Canada M.D.   On: 08/26/2021 09:04   Result Date: 08/26/2021 CLINICAL DATA:  45 year old female for six-month follow-up of RIGHT breast calcifications. History of LEFT breast DCIS and lumpectomy in 2021. EXAM: DIGITAL DIAGNOSTIC BILATERAL MAMMOGRAM WITH CAD AND TOMO COMPARISON:  Previous exam(s). ACR Breast Density Category c: The breast tissue is heterogeneously dense, which may obscure small masses. FINDINGS: Full field and magnification views of both breasts are performed. Primarily punctate calcifications within the posterior central/UPPER RIGHT breast are not significantly changed. No new or suspicious findings are noted within either breast. Lumpectomy changes with adjacent calcifications are again identified. Mammographic images were processed with CAD. IMPRESSION: 1. Likely benign RIGHT breast calcifications. Six-month follow-up recommended to ensure 1 year stability. 2. No new or suspicious findings within either breast.  RECOMMENDATION: At bilateral diagnostic mammogram in 1 year. I have discussed the findings and recommendations with the patient. If applicable, a reminder letter will be sent to the patient regarding the next appointment. BI-RADS CATEGORY  3: Probably benign. Electronically Signed: By: Margarette Canada M.D. On: 08/25/2021 16:48      Assessment and Plan: 45 y.o. female with ***   PDMP not reviewed this encounter. No orders of the defined types were placed in this encounter.  No orders of the defined types were placed in this encounter.    Discussed warning signs or symptoms. Please see discharge instructions. Patient expresses understanding.   ***

## 2021-08-28 ENCOUNTER — Ambulatory Visit: Payer: No Typology Code available for payment source | Admitting: Family Medicine

## 2021-08-28 ENCOUNTER — Other Ambulatory Visit: Payer: Self-pay

## 2021-08-28 ENCOUNTER — Ambulatory Visit (INDEPENDENT_AMBULATORY_CARE_PROVIDER_SITE_OTHER): Payer: No Typology Code available for payment source | Admitting: Physical Therapy

## 2021-08-28 DIAGNOSIS — M62838 Other muscle spasm: Secondary | ICD-10-CM

## 2021-08-28 DIAGNOSIS — M542 Cervicalgia: Secondary | ICD-10-CM

## 2021-08-31 ENCOUNTER — Encounter: Payer: Self-pay | Admitting: Physical Therapy

## 2021-08-31 NOTE — Therapy (Signed)
[facility] [address] [place], [place], [address] Phone: [phone]   Fax:  [phone]  Physical Therapy Treatment  Patient Details  Name: Victoria Maddox MRN: [id number] Date of Birth: 1976-07-08 Referring Provider (PT): Lynne Leader   Encounter Date: 08/28/2021   PT End of Session - 08/31/21 2139     Visit Number 4    Number of Visits 12    Date for PT Re-Evaluation 09/11/21    Authorization Type [facility]    PT Start Time 1104    PT Stop Time 1145    PT Time Calculation (min) 41 min    Activity Tolerance Patient tolerated treatment well    Behavior During Therapy [facility] for tasks assessed/performed             Past Medical History:  Diagnosis Date   Allergy    Depression    Family history of breast cancer    Family history of kidney cancer    Family history of throat cancer    Frequent headaches    Heart murmur    History of recurrent UTIs    Hypertension    Migraines    Personal history of radiation therapy    Urinary incontinence     Past Surgical History:  Procedure Laterality Date   BREAST BIOPSY Left 01/29/2020   BREAST BIOPSY Left 02/06/2020   BREAST LUMPECTOMY Left 03/12/2020   BREAST LUMPECTOMY WITH RADIOACTIVE SEED LOCALIZATION Left 03/12/2020   Procedure: LEFT BREAST LUMPECTOMY WITH BRACKETED RADIOACTIVE SEED LOCALIZATION;  Surgeon: Rolm Bookbinder, MD;  Location: [place];  Service: General;  Laterality: Left;   DILATION AND CURETTAGE OF UTERUS      There were no vitals filed for this visit.   Subjective Assessment - 08/31/21 2139     Subjective Pt states mildly less tightness in UT region. Still has soreness.    Patient Stated Goals decreased tightness and pain    Currently in Pain? Yes    Pain Score 5     Pain Location Neck    Pain Orientation Left;Right    Pain Descriptors / Indicators Aching;Tightness    Pain Type Chronic pain    Pain Onset More than a month ago     Pain Frequency Intermittent                               [facility] Adult PT Treatment/Exercise - 08/31/21 0001       Neck Exercises: Theraband   Rows 20 reps;Green    Shoulder External Rotation 20 reps    Shoulder External Rotation Limitations YTB      Neck Exercises: Standing   Other Standing Exercises Standing shoulder flex and abd (to 90 deg) ; practice in mirror for decreasing UT compensation.      Neck Exercises: Stretches   Upper Trapezius Stretch 3 reps;30 seconds    Levator Stretch 3 reps;30 seconds    Corner Stretch 3 reps;30 seconds    Corner Stretch Limitations doorway      Manual Therapy   Joint Mobilization Cervical PA mobs,    Soft tissue mobilization STM/DTM to bil UTs, B levator, B rhomboid    Manual Traction Light cervical distraction 10 sec x 8;                       PT Short Term Goals - 08/03/21 4481  PT SHORT TERM GOAL #1   Title Pt to be indepndent with initial HEP    Time 2    Period Weeks    Status New    Target Date 08/15/21               PT Long Term Goals - 08/03/21 0929       PT LONG TERM GOAL #1   Title Pt to be independent with final HEP    Time 6    Period Weeks    Status New    Target Date 09/12/21      PT LONG TERM GOAL #2   Title Pt to report decreased pain in neck to 0-2/10 with work and activities.    Time 6    Period Weeks    Status New    Target Date 09/12/21      PT LONG TERM GOAL #3   Title Pt to demo improved soft tissue limitations to be WNL in shoulders and neck region.    Time 6    Period Weeks    Status New    Target Date 09/12/21      PT LONG TERM GOAL #4   Title Pt to demo optimal posture in sititng and standing, to improve muscle tension and pain with activity and work duties.    Time 6    Period Weeks    Status New    Target Date 09/12/21                   Plan - 08/31/21 2143     Clinical Impression Statement Pt continues to have much  tightness and soreness in bil UTs. She has some improvement in postural awareness, but does require cues to relax UTs with ther ex. She will continue to benefit from manual for decreasing muscle tension. She may benefit from future dry needling, but declines today.    Personal Factors and Comorbidities Time since onset of injury/illness/exacerbation    Examination-Activity Limitations Sit;Bend;Carry;Reach Overhead;Lift    Examination-Participation Restrictions Cleaning;Community Activity;Shop;Driving;Meal Prep;Occupation    Stability/Clinical Decision Making Stable/Uncomplicated    Rehab Potential Good    PT Frequency 2x / week    PT Duration 6 weeks    PT Treatment/Interventions ADLs/Self Care Home Management;Cryotherapy;Functional mobility training;Electrical Stimulation;Iontophoresis 4mg /ml Dexamethasone;Moist Heat;Traction;Therapeutic exercise;Therapeutic activities;DME Instruction;Ultrasound;Neuromuscular re-education;Patient/family education;Manual techniques;Taping;Dry needling;Passive range of motion;Visual/perceptual remediation/compensation;Joint Manipulations;Spinal Manipulations    PT Home Exercise Plan [id number]    Consulted and Agree with Plan of Care Patient             Patient will benefit from skilled therapeutic intervention in order to improve the following deficits and impairments:  Decreased activity tolerance, Decreased strength, Pain, Increased muscle spasms, Decreased mobility, Decreased range of motion, Improper body mechanics, Impaired flexibility  Visit Diagnosis: Cervicalgia  Other muscle spasm     Problem List Patient Active Problem List   Diagnosis Date Noted   Prediabetes 07/08/2021   Shoulder pain, bilateral 07/08/2021   Hypertriglyceridemia 07/08/2021   GERD (gastroesophageal reflux disease) 07/08/2021   Upper back pain 07/08/2021   Genetic testing 02/22/2020   Family history of breast cancer    Family history of throat cancer    Family history  of kidney cancer    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 [place], [place], [address] Phone: [phone]   Fax:  [phone]  Name: Victoria Maddox MRN: [id number] Date of Birth: May 02, 1976

## 2021-09-02 ENCOUNTER — Ambulatory Visit (INDEPENDENT_AMBULATORY_CARE_PROVIDER_SITE_OTHER): Payer: No Typology Code available for payment source | Admitting: Physical Therapy

## 2021-09-02 ENCOUNTER — Encounter: Payer: Self-pay | Admitting: Physical Therapy

## 2021-09-02 ENCOUNTER — Other Ambulatory Visit: Payer: Self-pay

## 2021-09-02 DIAGNOSIS — M542 Cervicalgia: Secondary | ICD-10-CM | POA: Diagnosis not present

## 2021-09-02 DIAGNOSIS — M62838 Other muscle spasm: Secondary | ICD-10-CM | POA: Diagnosis not present

## 2021-09-02 NOTE — Progress Notes (Signed)
I, Wendy Poet, LAT, ATC, am serving as scribe for Dr. Lynne Leader.  Victoria Maddox is a 45 y.o. female who presents to [place] [facility]: [facility] today for f/u of B shoulder and upper back pain.  She was last seen by Dr. Georgina Snell on 07/17/21 and was referred to PT of which she completed 4 sessions.  She was also prescribed Tizanidine and advised to use heat.  Today, pt reports very slight improvement in shoulder and upper back pain. Pain is still locates in upper back and bilat trapz.   Diagnostic testing: C-spine XR- 07/17/21  Pertinent review of systems: No fevers or chills  Relevant historical information: History of breast cancer   Exam:  BP 130/82   Pulse 75   Ht 5\' 3"  (1.6 m)   Wt 128 lb (58.1 kg)   SpO2 99%   BMI 22.67 kg/m  General: Well Developed, well nourished, and in no acute distress.   MSK: C-spine normal-appearing.  Nontender midline.  Tender palpation right cervical paraspinal musculature.  Decreased cervical motion.  Upper extremity strength is intact.    Lab and Radiology Results EXAM: CERVICAL SPINE - 2-3 VIEW   COMPARISON:  None.   FINDINGS: The cervical spine is visualized from C1-the superior endplate of C7. 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 Vertebral body heights are maintained: no evidence of acute fracture. There is RIGHT sided robust calcific densities of the mid cervical spine likely reflecting intervertebral disc spaces are relatively preserved. Severe facet arthropathy. No prevertebral soft tissue swelling. Visualized thorax is unremarkable.   IMPRESSION: 1. 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 2. Exuberant osteophyte proliferation along the RIGHT aspect of the mid cervical spine likely reflecting severe facet arthropathy.     Electronically Signed   By: Valentino Saxon M.D.   On: 07/19/2021 12:04   I, Lynne Leader, personally (independently) visualized and performed the  interpretation of the images attached in this note.      Assessment and Plan: 45 y.o. female with chronic perispinal neck pain right worse than left.  This is a chronic issue ongoing for years.  Unfortunately she has failed trial of conservative management over the last 6 weeks including physical therapy.  Her cervical spine x-ray obtained in early September did show concern for facet DJD and a left-sided cervical rib at C7 both of which could explain some of her pain.  Plan for MRI to further characterize cause of pain and for potential facet injection planning.  Recheck after MRI.   PDMP not reviewed this encounter. Orders Placed This Encounter  Procedures   MR CERVICAL SPINE WO CONTRAST    Pt does have a biopsy clip in her breast.    Standing Status:   Future    Standing Expiration Date:   09/03/2022    Order Specific Question:   What is the patient's sedation requirement?    Answer:   No Sedation    Order Specific Question:   Does the patient have a pacemaker or implanted devices?    Answer:   No    Order Specific Question:   Preferred imaging location?    Answer:   [facility] (table limit-550lbs)   No orders of the defined types were placed in this encounter.    Discussed warning signs or symptoms. Please see discharge instructions. Patient expresses understanding.   The above documentation has been reviewed and is accurate and complete Lynne Leader, M.D.

## 2021-09-02 NOTE — Therapy (Signed)
[facility] [address] [place], [place], [address] Phone: [phone]   Fax:  [phone]  Physical Therapy Treatment  Patient Details  Name: Victoria Maddox MRN: [id number] Date of Birth: 05-28-1976 Referring Provider (PT): Lynne Leader   Encounter Date: 09/02/2021   PT End of Session - 09/02/21 2047     Visit Number 5    Number of Visits 12    Date for PT Re-Evaluation 09/11/21    Authorization Type [facility]    PT Start Time 6195    PT Stop Time 0932    PT Time Calculation (min) 49 min    Activity Tolerance Patient tolerated treatment well    Behavior During Therapy [facility] for tasks assessed/performed             Past Medical History:  Diagnosis Date   Allergy    Depression    Family history of breast cancer    Family history of kidney cancer    Family history of throat cancer    Frequent headaches    Heart murmur    History of recurrent UTIs    Hypertension    Migraines    Personal history of radiation therapy    Urinary incontinence     Past Surgical History:  Procedure Laterality Date   BREAST BIOPSY Left 01/29/2020   BREAST BIOPSY Left 02/06/2020   BREAST LUMPECTOMY Left 03/12/2020   BREAST LUMPECTOMY WITH RADIOACTIVE SEED LOCALIZATION Left 03/12/2020   Procedure: LEFT BREAST LUMPECTOMY WITH BRACKETED RADIOACTIVE SEED LOCALIZATION;  Surgeon: Rolm Bookbinder, MD;  Location: [place];  Service: General;  Laterality: Left;   DILATION AND CURETTAGE OF UTERUS      There were no vitals filed for this visit.   Subjective Assessment - 09/02/21 2046     Subjective Pt states she feels "looser" but still has pain in UTs.    Currently in Pain? Yes    Pain Score 5     Pain Location Neck    Pain Orientation Right;Left    Pain Descriptors / Indicators Aching;Tightness    Pain Type Chronic pain    Pain Onset More than a month ago    Pain Frequency Intermittent                                [facility] Adult PT Treatment/Exercise - 09/02/21 0001       Neck Exercises: Theraband   Rows 20 reps;Green    Shoulder External Rotation 20 reps    Shoulder External Rotation Limitations RTB    Other Theraband Exercises horiz abd RTB x 10      Neck Exercises: Standing   Other Standing Exercises --    Other Standing Exercises wall push ups x 20;      Neck Exercises: Stretches   Upper Trapezius Stretch 3 reps;30 seconds    Levator Stretch --    Community education officer Limitations --    Other Neck Stretches cat/cow x 15;  Childs pose 30 sec x 3 L, R, Center.      Moist Heat Therapy   Number Minutes Moist Heat 10 Minutes    Moist Heat Location Cervical      Manual Therapy   Manual therapy comments skilled palpation and monitoring of soft tissue with dry needling.    Joint Mobilization --    Soft tissue mobilization STM/DTM to bil  UTs, B levator,    Manual Traction --              Trigger Point Dry Needling - 09/02/21 0001     Consent Given? Yes    Education Handout Provided Previously provided    Muscles Treated Head and Neck Upper trapezius    Upper Trapezius Response Twitch reponse elicited;Palpable increased muscle length   L and R                    PT Short Term Goals - 08/03/21 0929       PT SHORT TERM GOAL #1   Title Pt to be indepndent with initial HEP    Time 2    Period Weeks    Status New    Target Date 08/15/21               PT Long Term Goals - 08/03/21 0929       PT LONG TERM GOAL #1   Title Pt to be independent with final HEP    Time 6    Period Weeks    Status New    Target Date 09/12/21      PT LONG TERM GOAL #2   Title Pt to report decreased pain in neck to 0-2/10 with work and activities.    Time 6    Period Weeks    Status New    Target Date 09/12/21      PT LONG TERM GOAL #3   Title Pt to demo improved soft tissue limitations to be WNL in shoulders and neck  region.    Time 6    Period Weeks    Status New    Target Date 09/12/21      PT LONG TERM GOAL #4   Title Pt to demo optimal posture in sititng and standing, to improve muscle tension and pain with activity and work duties.    Time 6    Period Weeks    Status New    Target Date 09/12/21                   Plan - 09/02/21 2049     Clinical Impression Statement Pt with significant muscle tension and soreness in BIl UTs. Manual and dry needling done today to address. Pt with good twitch response and good tolerance for bilateral UTs. Pt to benefit from continued care for decreasing muscle tightness and pain.    Personal Factors and Comorbidities Time since onset of injury/illness/exacerbation    Examination-Activity Limitations Sit;Bend;Carry;Reach Overhead;Lift    Examination-Participation Restrictions Cleaning;Community Activity;Shop;Driving;Meal Prep;Occupation    Stability/Clinical Decision Making Stable/Uncomplicated    Rehab Potential Good    PT Frequency 2x / week    PT Duration 6 weeks    PT Treatment/Interventions ADLs/Self Care Home Management;Cryotherapy;Functional mobility training;Electrical Stimulation;Iontophoresis 4mg /ml Dexamethasone;Moist Heat;Traction;Therapeutic exercise;Therapeutic activities;DME Instruction;Ultrasound;Neuromuscular re-education;Patient/family education;Manual techniques;Taping;Dry needling;Passive range of motion;Visual/perceptual remediation/compensation;Joint Manipulations;Spinal Manipulations    PT Home Exercise Plan [email or web address]    Consulted and Agree with Plan of Care Patient             Patient will benefit from skilled therapeutic intervention in order to improve the following deficits and impairments:  Decreased activity tolerance, Decreased strength, Pain, Increased muscle spasms, Decreased mobility, Decreased range of motion, Improper body mechanics, Impaired flexibility  Visit Diagnosis: Cervicalgia  Other muscle  spasm     Problem List Patient Active Problem List   Diagnosis Date Noted  Prediabetes 07/08/2021   Shoulder pain, bilateral 07/08/2021   Hypertriglyceridemia 07/08/2021   GERD (gastroesophageal reflux disease) 07/08/2021   Upper back pain 07/08/2021   Genetic testing 02/22/2020   Family history of breast cancer    Family history of throat cancer    Family history of kidney cancer    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

## 2021-09-03 ENCOUNTER — Ambulatory Visit (INDEPENDENT_AMBULATORY_CARE_PROVIDER_SITE_OTHER): Payer: No Typology Code available for payment source | Admitting: Family Medicine

## 2021-09-03 ENCOUNTER — Other Ambulatory Visit: Payer: Self-pay

## 2021-09-03 VITALS — BP 130/82 | HR 75 | Ht 63.0 in | Wt 128.0 lb

## 2021-09-03 DIAGNOSIS — M542 Cervicalgia: Secondary | ICD-10-CM

## 2021-09-03 DIAGNOSIS — G8929 Other chronic pain: Secondary | ICD-10-CM | POA: Diagnosis not present

## 2021-09-03 DIAGNOSIS — M62838 Other muscle spasm: Secondary | ICD-10-CM

## 2021-09-03 NOTE — Patient Instructions (Addendum)
Thank you for coming in today.   You should hear from MRI scheduling within 1 week. If you do not hear please let me know.    Recheck after MRI 

## 2021-09-05 ENCOUNTER — Other Ambulatory Visit: Payer: Self-pay | Admitting: Family Medicine

## 2021-09-05 ENCOUNTER — Other Ambulatory Visit (HOSPITAL_COMMUNITY): Payer: Self-pay

## 2021-09-05 DIAGNOSIS — I10 Essential (primary) hypertension: Secondary | ICD-10-CM

## 2021-09-05 MED ORDER — VERAPAMIL HCL ER 180 MG PO TBCR
EXTENDED_RELEASE_TABLET | Freq: Every morning | ORAL | 0 refills | Status: DC
  Filled 2021-09-05: qty 90, 90d supply, fill #0

## 2021-09-08 ENCOUNTER — Other Ambulatory Visit (HOSPITAL_COMMUNITY): Payer: Self-pay

## 2021-09-11 ENCOUNTER — Encounter: Payer: No Typology Code available for payment source | Admitting: Physical Therapy

## 2021-09-18 ENCOUNTER — Ambulatory Visit (INDEPENDENT_AMBULATORY_CARE_PROVIDER_SITE_OTHER): Payer: No Typology Code available for payment source | Admitting: Physical Therapy

## 2021-09-18 ENCOUNTER — Other Ambulatory Visit: Payer: Self-pay

## 2021-09-18 ENCOUNTER — Encounter: Payer: Self-pay | Admitting: Physical Therapy

## 2021-09-18 DIAGNOSIS — M62838 Other muscle spasm: Secondary | ICD-10-CM | POA: Diagnosis not present

## 2021-09-18 DIAGNOSIS — M542 Cervicalgia: Secondary | ICD-10-CM | POA: Diagnosis not present

## 2021-09-18 NOTE — Patient Instructions (Signed)
Access Code: [email or web address] URL: [email or web address] Date: 09/18/2021 Prepared by: Lyndee Hensen  Exercises Seated Upper Trapezius Stretch - 2 x daily - 3 reps - 30 hold Seated Levator Scapulae Stretch - 2 x daily - 3 reps - 30 hold Standing Scapular Retraction - 2 x daily - 1-2 sets - 10 reps Standing Backward Shoulder Rolls - 2 x daily - 1 sets - 10 reps Doorway Pec Stretch at 90 Degrees Abduction - 2 x daily - 3 reps - 30 hold Wall Angels - 1 x daily - 1 sets - 10 reps Cat Cow - 2 x daily - 1 sets - 10 reps

## 2021-09-18 NOTE — Therapy (Signed)
[facility] [address] [place], [place], [address] Phone: [phone]   Fax:  [phone]  Physical Therapy Treatment/Re-Cert   Patient Details  Name: Victoria Maddox MRN: [id number] Date of Birth: 1976-06-04 Referring Provider (PT): Lynne Leader   Encounter Date: 09/18/2021   PT End of Session - 09/18/21 1146     Visit Number 6    Number of Visits 20    Date for PT Re-Evaluation 10/30/21    Authorization Type [facility]     Recert done at visit 6.    PT Start Time 0845    PT Stop Time [phone]    PT Time Calculation (min) 53 min    Activity Tolerance Patient tolerated treatment well    Behavior During Therapy WFL for tasks assessed/performed             Past Medical History:  Diagnosis Date   Allergy    Depression    Family history of breast cancer    Family history of kidney cancer    Family history of throat cancer    Frequent headaches    Heart murmur    History of recurrent UTIs    Hypertension    Migraines    Personal history of radiation therapy    Urinary incontinence     Past Surgical History:  Procedure Laterality Date   BREAST BIOPSY Left 01/29/2020   BREAST BIOPSY Left 02/06/2020   BREAST LUMPECTOMY Left 03/12/2020   BREAST LUMPECTOMY WITH RADIOACTIVE SEED LOCALIZATION Left 03/12/2020   Procedure: LEFT BREAST LUMPECTOMY WITH BRACKETED RADIOACTIVE SEED LOCALIZATION;  Surgeon: Rolm Bookbinder, MD;  Location: [place];  Service: General;  Laterality: Left;   DILATION AND CURETTAGE OF UTERUS      There were no vitals filed for this visit.   Subjective Assessment - 09/18/21 1144     Subjective Pt states tightness and pain in bil UT region, R>L.    Patient Stated Goals decreased tightness and pain    Currently in Pain? Yes    Pain Score 5     Pain Location Neck    Pain Orientation Right;Left    Pain Descriptors / Indicators Aching;Tightness    Pain Type Chronic pain    Pain Onset  More than a month ago    Pain Frequency Intermittent    Aggravating Factors  heat , massage, stretching    Pain Relieving Factors housework, wok duties, lifting.                [facility] PT Assessment - 09/18/21 0001       Assessment   Medical Diagnosis Cervcial pain    Referring Provider (PT) Lynne Leader    Hand Dominance Right    Prior Therapy no      Precautions   Precautions None      Balance Screen   Has the patient fallen in the past 6 months No      Prior Function   Level of Independence Independent      Cognition   Overall Cognitive Status Within Functional Limits for tasks assessed      AROM   Overall AROM Comments Cervical: WNL, shoulder: WNL      Strength   Overall Strength Comments UE: 4/5, scapular: 4/5      Palpation   Palpation comment Tightness and trigger points in bil UT, levator, R>L. TIghtness and soreness in bil SO  [facility] Adult PT Treatment/Exercise - 09/18/21 0001       Neck Exercises: Theraband   Rows --    Rows Limitations low row , RTB x15    Shoulder External Rotation 20 reps    Shoulder External Rotation Limitations RTB    Other Theraband Exercises --      Neck Exercises: Standing   Other Standing Exercises wall angel x15    Other Standing Exercises wall push ups x 20;      Neck Exercises: Supine   Other Supine Exercise Supine shoulder ER 90/90 x 10;      Neck Exercises: Stretches   Upper Trapezius Stretch 3 reps;30 seconds    Corner Stretch 3 reps;30 seconds    Corner Stretch Limitations doorway    Other Neck Stretches cat/cow x 15, reviewd for HEP      Moist Heat Therapy   Number Minutes Moist Heat 10 Minutes    Moist Heat Location Cervical      Manual Therapy   Manual therapy comments skilled palpation and monitoring of soft tissue with dry needling.    Joint Mobilization Cervical PA mobs.    Soft tissue mobilization STM/DTM to bil UTs, cervical paraspinals, SOs, SOR,    Manual  Traction cervical distraction 10 sec x 10;              Trigger Point Dry Needling - 09/18/21 0001     Consent Given? Yes    Education Handout Provided Previously provided    Muscles Treated Head and Neck Upper trapezius    Upper Trapezius Response Twitch reponse elicited;Palpable increased muscle length   Bil                  PT Education - 09/18/21 1145     Education Details HEP updated and reviewed, POC and frequency/attendance reviewed.    Person(s) Educated Patient    Methods Explanation;Demonstration;Tactile cues;Verbal cues;Handout    Comprehension Verbalized understanding;Returned demonstration;Verbal cues required;Tactile cues required;Need further instruction              PT Short Term Goals - 09/18/21 1147       PT SHORT TERM GOAL #1   Title Pt to be indepndent with initial HEP    Time 2    Period Weeks    Status Achieved    Target Date 08/15/21               PT Long Term Goals - 09/18/21 1148       PT LONG TERM GOAL #1   Title Pt to be independent with final HEP    Time 6    Period Weeks    Status Partially Met    Target Date 10/30/21      PT LONG TERM GOAL #2   Title Pt to report decreased pain in neck to 0-2/10 with work and activities.    Time 6    Period Weeks    Status On-going    Target Date 10/30/21      PT LONG TERM GOAL #3   Title Pt to demo improved soft tissue limitations to be WNL in shoulders and neck region.    Time 6    Period Weeks    Status On-going    Target Date 10/30/21      PT LONG TERM GOAL #4   Title Pt to demo optimal posture in sititng and standing, to improve muscle tension and pain with activity and work duties.  Time 6    Period Weeks    Status Partially Met    Target Date 10/30/21                   Plan - 09/18/21 1149     Clinical Impression Statement Pt has been seen for 6 visits. She has improved postural awareness, and is doing well with HEP. She continues to have  significant muscle tension in neck and UTs, with pain. She has responded well to manual, and continued education for ther ex, posture, and stretching. Pt to benefit from continued care, for improving pain, muscle tension, posture, and postural strengthening. Pt with good tolerance for dry needling today.    Personal Factors and Comorbidities Time since onset of injury/illness/exacerbation    Examination-Activity Limitations Sit;Bend;Carry;Reach Overhead;Lift    Examination-Participation Restrictions Cleaning;Community Activity;Shop;Driving;Meal Prep;Occupation    Stability/Clinical Decision Making Stable/Uncomplicated    Rehab Potential Good    PT Frequency 2x / week    PT Duration 6 weeks    PT Treatment/Interventions ADLs/Self Care Home Management;Cryotherapy;Functional mobility training;Electrical Stimulation;Iontophoresis 82m/ml Dexamethasone;Moist Heat;Traction;Therapeutic exercise;Therapeutic activities;DME Instruction;Ultrasound;Neuromuscular re-education;Patient/family education;Manual techniques;Taping;Dry needling;Passive range of motion;Visual/perceptual remediation/compensation;Joint Manipulations;Spinal Manipulations    PT Home Exercise Plan [email or web address]    Consulted and Agree with Plan of Care Patient             Patient will benefit from skilled therapeutic intervention in order to improve the following deficits and impairments:  Decreased activity tolerance, Decreased strength, Pain, Increased muscle spasms, Decreased mobility, Decreased range of motion, Improper body mechanics, Impaired flexibility  Visit Diagnosis: Cervicalgia  Other muscle spasm     Problem List Patient Active Problem List   Diagnosis Date Noted   Prediabetes 07/08/2021   Shoulder pain, bilateral 07/08/2021   Hypertriglyceridemia 07/08/2021   GERD (gastroesophageal reflux disease) 07/08/2021   Upper back pain 07/08/2021   Genetic testing 02/22/2020   Family history of breast cancer    Family  history of throat cancer    Family history of kidney cancer    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

## 2021-09-23 ENCOUNTER — Ambulatory Visit
Admission: RE | Admit: 2021-09-23 | Discharge: 2021-09-23 | Disposition: A | Discharge status: Home / Self Care | Payer: No Typology Code available for payment source | Source: Ambulatory Visit | Attending: Family Medicine | Admitting: Family Medicine

## 2021-09-23 ENCOUNTER — Other Ambulatory Visit: Payer: Self-pay

## 2021-09-23 DIAGNOSIS — M62838 Other muscle spasm: Secondary | ICD-10-CM

## 2021-09-23 IMAGING — MR MR CERVICAL SPINE W/O CM
4 of 5 series · 30 of 48 positions shown · non-contrast
Comparison: Cervical spine radiographs [DATE]

CLINICAL DATA: Cervical radiculopathy

EXAM:
MRI CERVICAL SPINE WITHOUT CONTRAST
TECHNIQUE: Multiplanar, multisequence MR imaging of the cervical spine was
performed. No intravenous contrast was administered.

[Series 2: T2 · sagittal · 3.0mm · 0.66mm/px · 8 of 13 slices shown (1 of 2)]
[im 1/13]
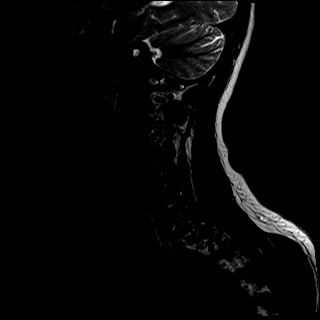
[im 2/13]
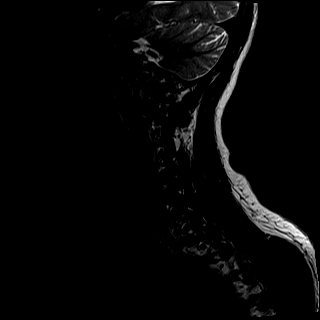
[im 4/13]
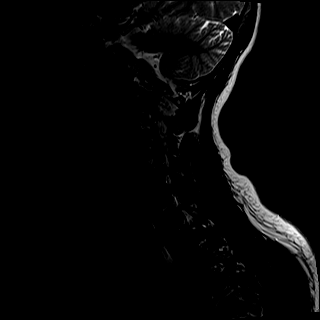
[im 6/13]
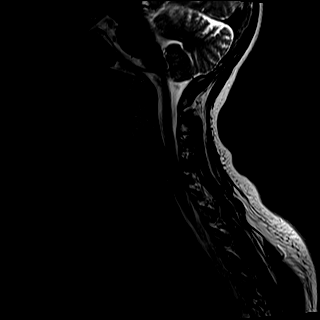
[im 7/13]
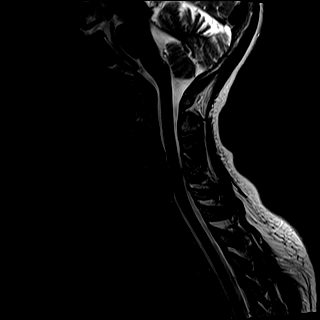
[im 9/13]
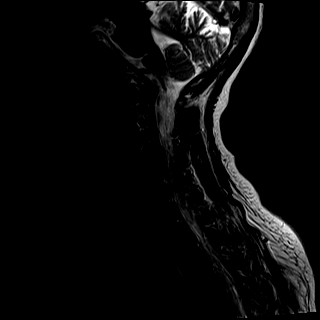
[im 11/13]
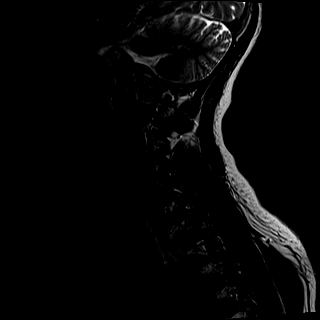
[im 13/13]
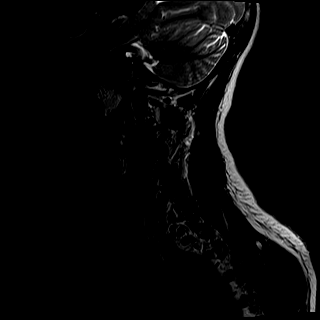

[Series 3: T1 · sagittal · 3.0mm · 0.41mm/px · 7 of 13 slices shown]
[im 1/13]
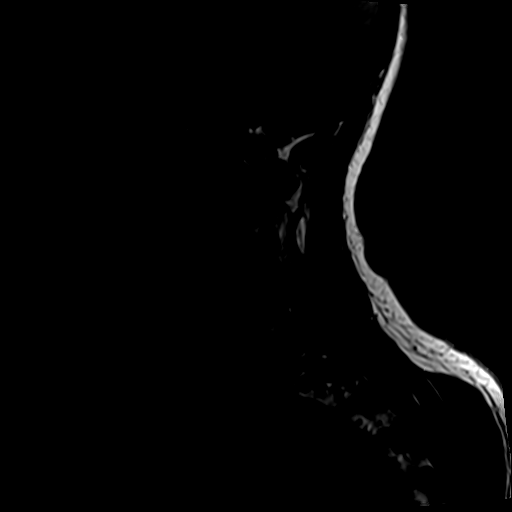
[im 3/13]
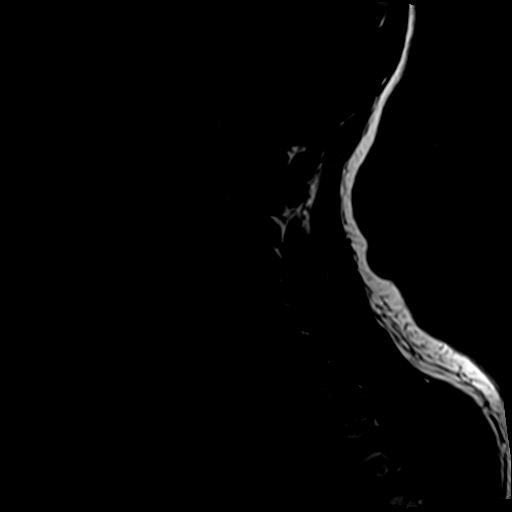
[im 5/13]
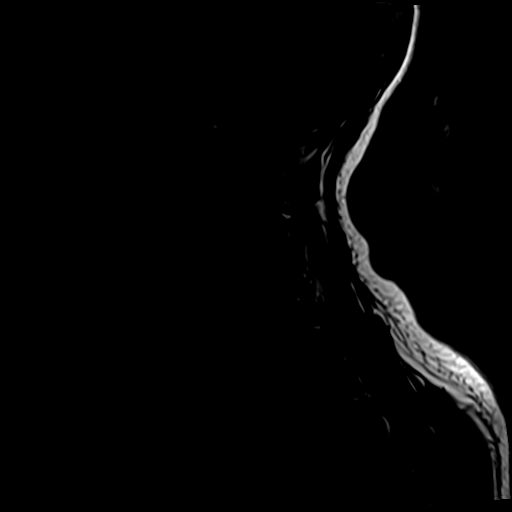
[im 7/13]
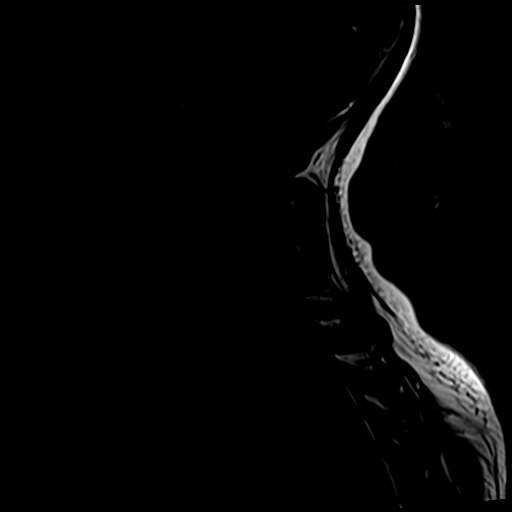
[im 9/13]
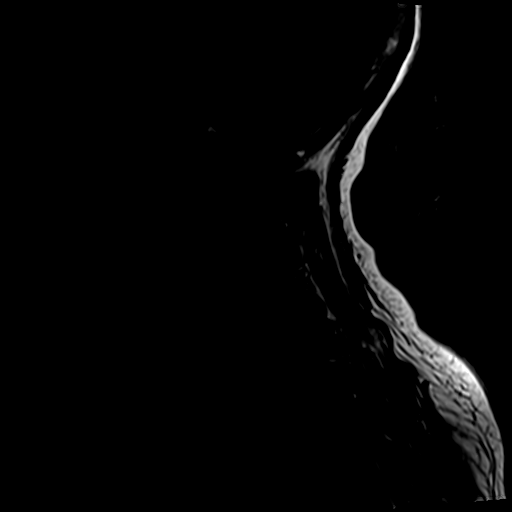
[im 11/13]
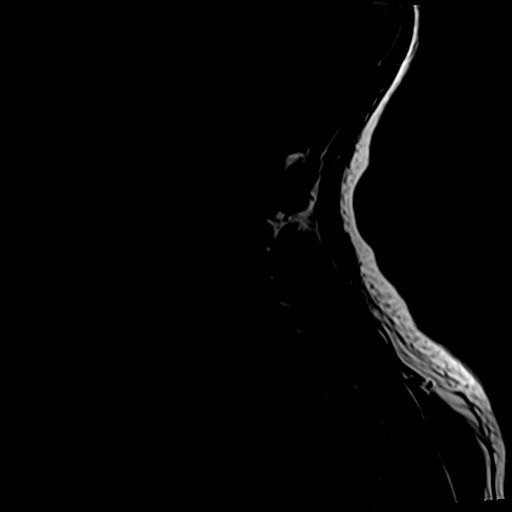
[im 13/13]
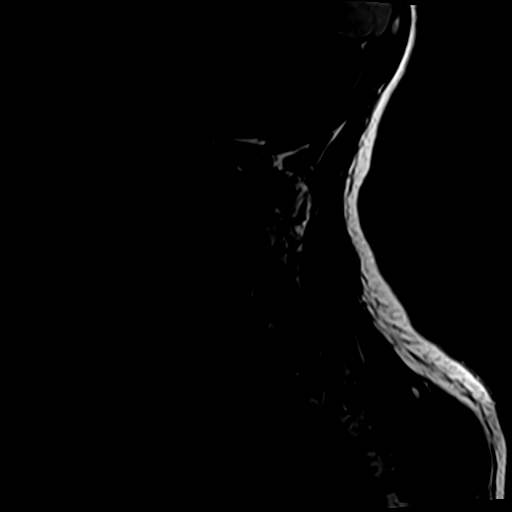

[Series 4: tir sag · sagittal · 3.0mm · 0.41mm/px · 6 of 13 slices shown]
[im 1/13]
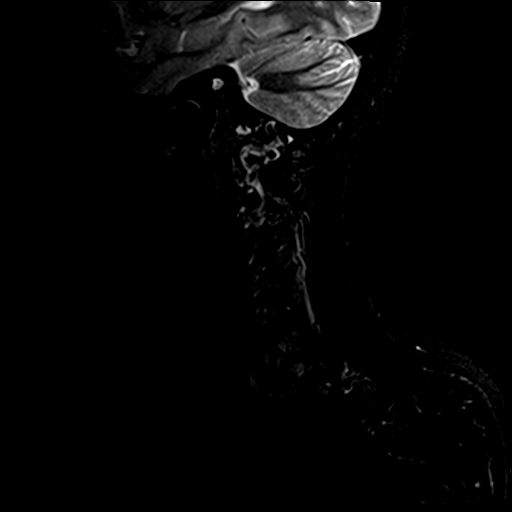
[im 3/13]
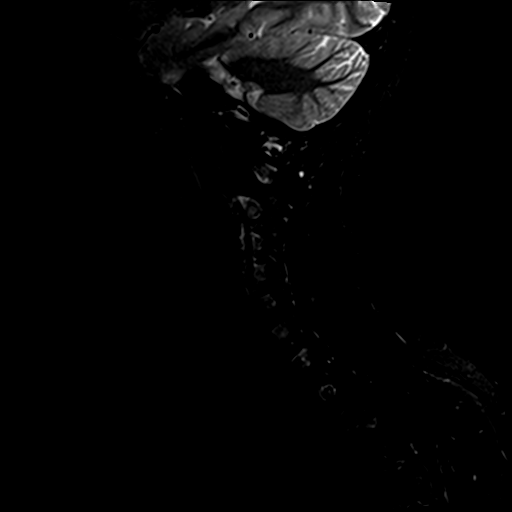
[im 5/13]
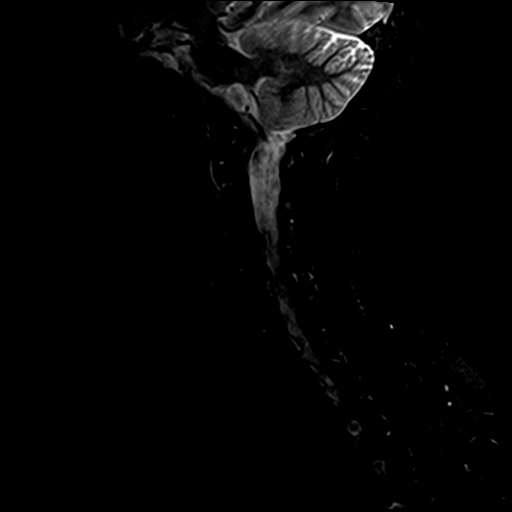
[im 7/13]
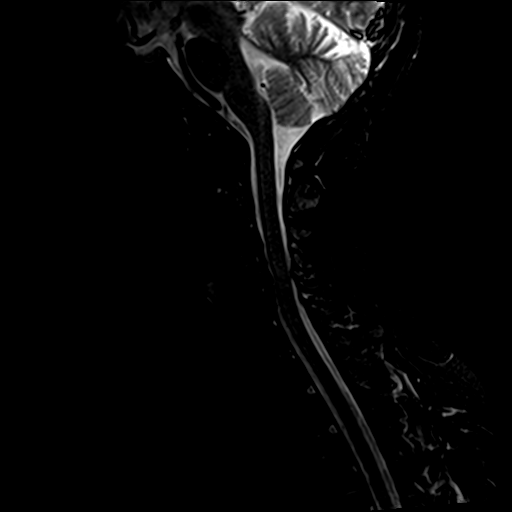
[im 9/13]
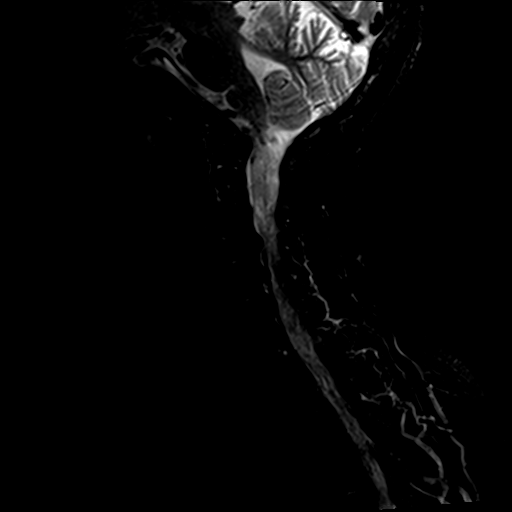
[im 11/13]
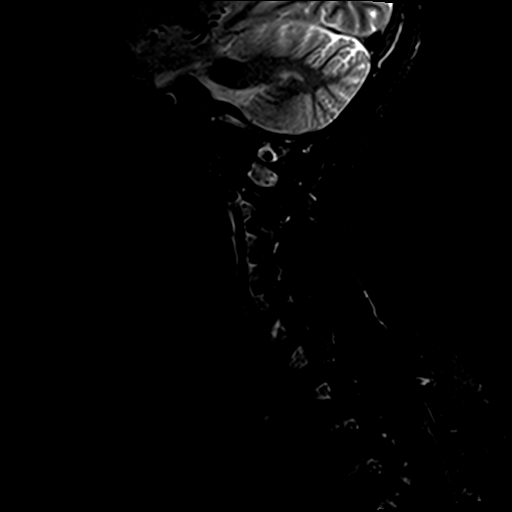

[Series 6: T2 · axial · 3.0mm · 0.70mm/px · z∈[-2,+84]mm · 9 of 24 slices shown (2 of 2)]
[im 1/24]
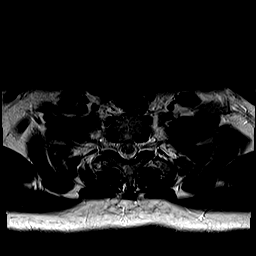
[im 4/24]
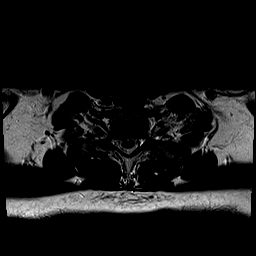
[im 8/24]
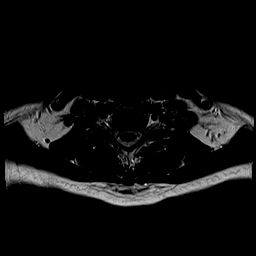
[im 10/24]
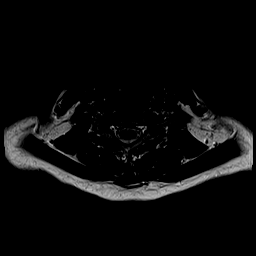
[im 12/24]
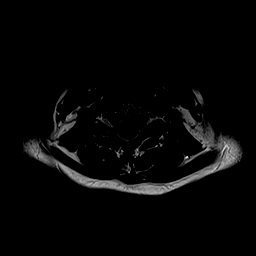
[im 14/24]
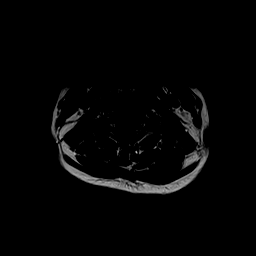
[im 16/24]
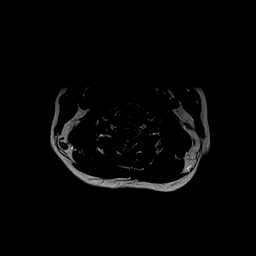
[im 20/24]
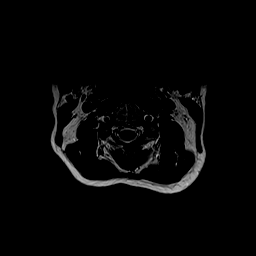
[im 24/24]
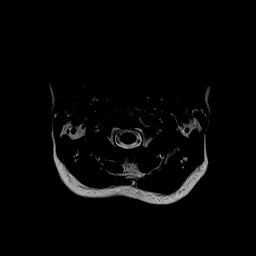

[30 of 48 positions shown; findings below may reference images not displayed]

FINDINGS: Alignment: Normal

Vertebrae: Normal bone marrow.  Negative for fracture or mass

Cord: Normal signal and morphology

Posterior Fossa, vertebral arteries, paraspinal tissues: Negative

Disc levels:

C2-3: Negative

C3-4: Mild disc degeneration and uncinate spurring. Mild foraminal
narrowing bilaterally

C4-5: Central disc protrusion. Diffuse uncinate spurring. Cord
flattening with mild spinal stenosis. Mild to moderate foraminal
stenosis bilaterally.

C5-6: Mild disc degeneration and spurring. Mild foraminal narrowing
bilaterally

C6-7: Negative

C7-T1: Negative
IMPRESSION: Mild foraminal narrowing bilaterally at C3-4 due to spurring.

Mild spinal stenosis mild to moderate foraminal stenosis bilaterally
at C4-5 due to disc protrusion and spurring.

Mild foraminal narrowing bilaterally at C5-6 due to spurring.

## 2021-09-25 ENCOUNTER — Encounter: Payer: No Typology Code available for payment source | Admitting: Physical Therapy

## 2021-09-25 NOTE — Progress Notes (Signed)
MRI does show some bulging disks could potentially pinch nerves especially at C4-C5.  Additionally theology does not comment on this but when I looked at the MRI I think you have some facet joint arthritis that could potentially be a source of pain that could be injected as well.  Recommend return to clinic to go over the results of full detail and discuss treatment plan and options.  I think there is something that we could do that would help.

## 2021-09-30 ENCOUNTER — Other Ambulatory Visit: Payer: Self-pay

## 2021-09-30 ENCOUNTER — Encounter: Payer: Self-pay | Admitting: Physical Therapy

## 2021-09-30 ENCOUNTER — Ambulatory Visit (INDEPENDENT_AMBULATORY_CARE_PROVIDER_SITE_OTHER): Payer: No Typology Code available for payment source | Admitting: Physical Therapy

## 2021-09-30 DIAGNOSIS — M62838 Other muscle spasm: Secondary | ICD-10-CM | POA: Diagnosis not present

## 2021-09-30 DIAGNOSIS — M542 Cervicalgia: Secondary | ICD-10-CM

## 2021-09-30 NOTE — Progress Notes (Deleted)
    Victoria Maddox is a 45 y.o. female who presents to [place] at [facility] today for f/u chronic perispinal neck pain. Pt was last seen by Dr. Georgina Snell on 09/03/21 and was advised to proceed to MRI to further characterize the cause of pain. Today, pt reports  Dx imaging: 09/23/21 C-spine MRI  07/17/21 C-spine XR  Pertinent review of systems: ***  Relevant historical information: ***   Exam:  There were no vitals taken for this visit. General: Well Developed, well nourished, and in no acute distress.   MSK: ***    Lab and Radiology Results No results found for this or any previous visit (from the past 72 hour(s)). No results found.     Assessment and Plan: 45 y.o. female with ***   PDMP not reviewed this encounter. No orders of the defined types were placed in this encounter.  No orders of the defined types were placed in this encounter.    Discussed warning signs or symptoms. Please see discharge instructions. Patient expresses understanding.   ***

## 2021-10-01 ENCOUNTER — Ambulatory Visit: Payer: No Typology Code available for payment source | Admitting: Family Medicine

## 2021-10-01 ENCOUNTER — Encounter: Payer: Self-pay | Admitting: Physical Therapy

## 2021-10-01 NOTE — Therapy (Signed)
[facility] [address] [place], [place], [address] Phone: [phone]   Fax:  [phone]  Physical Therapy Treatment  Patient Details  Name: Victoria Maddox MRN: [id number] Date of Birth: 05/19/1976 Referring Provider (PT): Lynne Leader   Encounter Date: 09/30/2021   PT End of Session - 09/30/21 0851     Visit Number 7    Number of Visits 20    Date for PT Re-Evaluation 10/30/21    Authorization Type [facility]     Recert done at visit 6.    PT Start Time [phone]    PT Stop Time 0925    PT Time Calculation (min) 38 min    Activity Tolerance Patient tolerated treatment well    Behavior During Therapy WFL for tasks assessed/performed             Past Medical History:  Diagnosis Date   Allergy    Depression    Family history of breast cancer    Family history of kidney cancer    Family history of throat cancer    Frequent headaches    Heart murmur    History of recurrent UTIs    Hypertension    Migraines    Personal history of radiation therapy    Urinary incontinence     Past Surgical History:  Procedure Laterality Date   BREAST BIOPSY Left 01/29/2020   BREAST BIOPSY Left 02/06/2020   BREAST LUMPECTOMY Left 03/12/2020   BREAST LUMPECTOMY WITH RADIOACTIVE SEED LOCALIZATION Left 03/12/2020   Procedure: LEFT BREAST LUMPECTOMY WITH BRACKETED RADIOACTIVE SEED LOCALIZATION;  Surgeon: Rolm Bookbinder, MD;  Location: [facility];  Service: General;  Laterality: Left;   DILATION AND CURETTAGE OF UTERUS      There were no vitals filed for this visit.   Subjective Assessment - 09/30/21 0847     Subjective Pt states continued pain, mostly in R posterior shoulder/med scap border. Has been doing HEP    Patient Stated Goals decreased tightness and pain    Currently in Pain? Yes    Pain Score 5     Pain Location Neck    Pain Orientation Right;Left    Pain Descriptors / Indicators Aching;Tightness    Pain  Type Chronic pain    Pain Onset More than a month ago    Pain Frequency Intermittent                               [facility] Adult PT Treatment/Exercise - 10/01/21 0001       Neck Exercises: Theraband   Rows 20 reps;Green    Rows Limitations low row , RTB x15    Shoulder External Rotation 20 reps    Shoulder External Rotation Limitations RTB      Neck Exercises: Standing   Other Standing Exercises wall angel x15      Neck Exercises: Supine   Other Supine Exercise Supine shoulder flexion x10 Cane, ER 90/90 x 10 with cane;      Neck Exercises: Stretches   Corner Stretch 3 reps;30 seconds    Corner Stretch Limitations doorway      Moist Heat Therapy   Moist Heat Location Cervical      Manual Therapy   Manual therapy comments skilled palpation and monitoring of soft tissue with dry needling.    Joint Mobilization Cervical and upper thoracic  PA mobs.    Soft tissue mobilization STM/DTM  to bil UTs, cervical paraspinals, SOs, SOR,  DTM/TPR to R levator, rhomboid,    Manual Traction cervical distraction 10 sec x 10;              Trigger Point Dry Needling - 10/01/21 0001     Consent Given? Yes    Education Handout Provided Previously provided    Muscles Treated Head and Neck Levator scapulae;Upper trapezius    Muscles Treated Upper Quadrant Rhomboids    Upper Trapezius Response Twitch reponse elicited;Palpable increased muscle length   R   Levator Scapulae Response Twitch response elicited;Palpable increased muscle length   R   Rhomboids Response Palpable increased muscle length   R                    PT Short Term Goals - 09/18/21 1147       PT SHORT TERM GOAL #1   Title Pt to be indepndent with initial HEP    Time 2    Period Weeks    Status Achieved    Target Date 08/15/21               PT Long Term Goals - 09/18/21 1148       PT LONG TERM GOAL #1   Title Pt to be independent with final HEP    Time 6    Period Weeks     Status Partially Met    Target Date 10/30/21      PT LONG TERM GOAL #2   Title Pt to report decreased pain in neck to 0-2/10 with work and activities.    Time 6    Period Weeks    Status On-going    Target Date 10/30/21      PT LONG TERM GOAL #3   Title Pt to demo improved soft tissue limitations to be WNL in shoulders and neck region.    Time 6    Period Weeks    Status On-going    Target Date 10/30/21      PT LONG TERM GOAL #4   Title Pt to demo optimal posture in sititng and standing, to improve muscle tension and pain with activity and work duties.    Time 6    Period Weeks    Status Partially Met    Target Date 10/30/21                   Plan - 10/01/21 1056     Clinical Impression Statement Pt having most continued soreness at R levator and rhomboid region. Focus for manual and soft tissue release here today, for decreased pain. Good twitch response with dry needling. Pt to benefit from continued care for decreasing soft tissue restrictions, to decrease pain, as well as manual distraction/traction and mobilization for cervical mobility and pain.    Personal Factors and Comorbidities Time since onset of injury/illness/exacerbation    Examination-Activity Limitations Sit;Bend;Carry;Reach Overhead;Lift    Examination-Participation Restrictions Cleaning;Community Activity;Shop;Driving;Meal Prep;Occupation    Stability/Clinical Decision Making Stable/Uncomplicated    Rehab Potential Good    PT Frequency 2x / week    PT Duration 6 weeks    PT Treatment/Interventions ADLs/Self Care Home Management;Cryotherapy;Functional mobility training;Electrical Stimulation;Iontophoresis 87m/ml Dexamethasone;Moist Heat;Traction;Therapeutic exercise;Therapeutic activities;DME Instruction;Ultrasound;Neuromuscular re-education;Patient/family education;Manual techniques;Taping;Dry needling;Passive range of motion;Visual/perceptual remediation/compensation;Joint Manipulations;Spinal  Manipulations    PT Home Exercise Plan [email or web address]    Consulted and Agree with Plan of Care Patient  Patient will benefit from skilled therapeutic intervention in order to improve the following deficits and impairments:  Decreased activity tolerance, Decreased strength, Pain, Increased muscle spasms, Decreased mobility, Decreased range of motion, Improper body mechanics, Impaired flexibility  Visit Diagnosis: Cervicalgia  Other muscle spasm     Problem List 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

## 2021-10-07 ENCOUNTER — Ambulatory Visit (INDEPENDENT_AMBULATORY_CARE_PROVIDER_SITE_OTHER): Payer: No Typology Code available for payment source | Admitting: Physical Therapy

## 2021-10-07 ENCOUNTER — Other Ambulatory Visit: Payer: Self-pay

## 2021-10-07 DIAGNOSIS — M62838 Other muscle spasm: Secondary | ICD-10-CM | POA: Diagnosis not present

## 2021-10-07 DIAGNOSIS — M542 Cervicalgia: Secondary | ICD-10-CM

## 2021-10-09 ENCOUNTER — Ambulatory Visit: Payer: No Typology Code available for payment source | Admitting: Family Medicine

## 2021-10-09 NOTE — Progress Notes (Deleted)
   I, Peterson Lombard, LAT, ATC acting as a scribe for Lynne Leader, MD.  Victoria Maddox is a 45 y.o. female who presents to [facility] today for chronic perispinal neck pain and bilat trapezius muscle spasm. Pt was last seen by Dr. Georgina Snell on 09/03/21 and was advised to proceed to MRI to further characterize the cause of her pain. Pt continued PT, completing 8 total visits. Today, pt reports  Dx imaging: 09/23/21 C-spine MRI  07/17/21 C-spine XR  02/17/02 C-spine XR  Pertinent review of systems: ***  Relevant historical information: ***   Exam:  There were no vitals taken for this visit. General: Well Developed, well nourished, and in no acute distress.   MSK: ***    Lab and Radiology Results No results found for this or any previous visit (from the past 72 hour(s)). No results found.     Assessment and Plan: 45 y.o. female with ***   PDMP not reviewed this encounter. No orders of the defined types were placed in this encounter.  No orders of the defined types were placed in this encounter.    Discussed warning signs or symptoms. Please see discharge instructions. Patient expresses understanding.   ***

## 2021-10-15 ENCOUNTER — Encounter: Payer: No Typology Code available for payment source | Admitting: Physical Therapy

## 2021-10-20 ENCOUNTER — Other Ambulatory Visit (HOSPITAL_COMMUNITY)
Admission: RE | Admit: 2021-10-20 | Discharge: 2021-10-20 | Disposition: A | Discharge status: Home / Self Care | Payer: No Typology Code available for payment source | Source: Ambulatory Visit | Attending: Occupational Medicine | Admitting: Occupational Medicine

## 2021-10-20 DIAGNOSIS — Z029 Encounter for administrative examinations, unspecified: Secondary | ICD-10-CM | POA: Insufficient documentation

## 2021-10-21 ENCOUNTER — Ambulatory Visit (INDEPENDENT_AMBULATORY_CARE_PROVIDER_SITE_OTHER): Payer: No Typology Code available for payment source | Admitting: Physical Therapy

## 2021-10-21 ENCOUNTER — Other Ambulatory Visit: Payer: Self-pay

## 2021-10-21 ENCOUNTER — Encounter: Payer: Self-pay | Admitting: Physical Therapy

## 2021-10-21 DIAGNOSIS — M542 Cervicalgia: Secondary | ICD-10-CM

## 2021-10-21 DIAGNOSIS — M62838 Other muscle spasm: Secondary | ICD-10-CM

## 2021-10-21 NOTE — Therapy (Addendum)
[facility] [address] [place], [place], [address] Phone: [phone]   Fax:  [phone]  Physical Therapy Treatment  Patient Details  Name: Victoria Maddox MRN: [id number] Date of Birth: 04-12-1976 Referring Provider (PT): Lynne Leader   Encounter Date: 10/21/2021   PT End of Session - 10/21/21 2128     Visit Number 9    Number of Visits 20    Date for PT Re-Evaluation 10/30/21    Authorization Type [facility]     Recert done at visit 6.    PT Start Time 1302    PT Stop Time 1345    PT Time Calculation (min) 43 min    Activity Tolerance Patient tolerated treatment well    Behavior During Therapy WFL for tasks assessed/performed             Past Medical History:  Diagnosis Date   Allergy    Depression    Family history of breast cancer    Family history of kidney cancer    Family history of throat cancer    Frequent headaches    Heart murmur    History of recurrent UTIs    Hypertension    Migraines    Personal history of radiation therapy    Urinary incontinence     Past Surgical History:  Procedure Laterality Date   BREAST BIOPSY Left 01/29/2020   BREAST BIOPSY Left 02/06/2020   BREAST LUMPECTOMY Left 03/12/2020   BREAST LUMPECTOMY WITH RADIOACTIVE SEED LOCALIZATION Left 03/12/2020   Procedure: LEFT BREAST LUMPECTOMY WITH BRACKETED RADIOACTIVE SEED LOCALIZATION;  Surgeon: Rolm Bookbinder, MD;  Location: [facility];  Service: General;  Laterality: Left;   DILATION AND CURETTAGE OF UTERUS      There were no vitals filed for this visit.   Subjective Assessment - 10/21/21 2126     Subjective Pt states continued improvements in tightness and soreness in UTs. She does still have pain in R rhomboid region. Notes increased pain with more work hours and work duties.    Currently in Pain? Yes    Pain Score 5     Pain Location Neck    Pain Orientation Right    Pain Descriptors / Indicators  Aching;Tightness    Pain Type Chronic pain    Pain Onset More than a month ago    Pain Frequency Intermittent                               [facility] Adult PT Treatment/Exercise - 10/21/21 2123       Neck Exercises: Theraband   Rows 20 reps;Green    Rows Limitations low row , RTB x15    Shoulder External Rotation 20 reps    Shoulder External Rotation Limitations RTB      Neck Exercises: Standing   Other Standing Exercises wall angel x15, full shoulder flexion x 10, abd x 10;    Other Standing Exercises wall push ups x 20;      Neck Exercises: Stretches   Corner Stretch 3 reps;30 seconds    Corner Stretch Limitations doorway    Other Neck Stretches childs poset 30 sec x 2 ;      Moist Heat Therapy   Moist Heat Location Cervical      Manual Therapy   Joint Mobilization Cervical and upper thoracic  PA mobs.    Soft tissue mobilization DTM/TPR to R levator, rhomboids,  mid trap    Manual Traction cervical distraction 10 sec x 10;                     PT Education - 10/21/21 2127     Education Details reviewed HEP, discussed F/U with MD.    Person(s) Educated Patient    Methods Explanation;Demonstration;Tactile cues;Verbal cues;Handout    Comprehension Verbalized understanding;Returned demonstration;Verbal cues required;Tactile cues required              PT Short Term Goals - 09/18/21 1147       PT SHORT TERM GOAL #1   Title Pt to be indepndent with initial HEP    Time 2    Period Weeks    Status Achieved    Target Date 08/15/21               PT Long Term Goals - 10/21/21 2130       PT LONG TERM GOAL #1   Title Pt to be independent with final HEP    Time 6    Period Weeks    Status Achieved    Target Date 10/30/21      PT LONG TERM GOAL #2   Title Pt to report decreased pain in neck to 0-2/10 with work and activities.    Time 6    Period Weeks    Status Partially Met    Target Date 10/30/21      PT LONG TERM GOAL #3    Title Pt to demo improved soft tissue limitations to be WNL in shoulders and neck region.    Time 6    Period Weeks    Status Partially Met    Target Date 10/30/21      PT LONG TERM GOAL #4   Title Pt to demo optimal posture in sititng and standing, to improve muscle tension and pain with activity and work duties.    Time 6    Period Weeks    Status Achieved    Target Date 10/30/21                   Plan - 10/21/21 2131     Clinical Impression Statement Pt has been seen for 9 visits since 07/31/21. She has had relief of tightness and soreness in Bil UT region, with improved postural awareness. We did discuss shoulder and neck mechanics for computer and work duties today, as pt continues to have increased pain at work. She has ongoing pain in R med scap border that has not improved significantly in the last couple weeks. Recommended that pt f/u with referring MD, as she missed her last appt. Reviewed final HEP in detail today. Pt to be put on hold until after she has MD f/u, due to ongoing pain. Pt in agreement with plan.    Personal Factors and Comorbidities Time since onset of injury/illness/exacerbation    Examination-Activity Limitations Sit;Bend;Carry;Reach Overhead;Lift    Examination-Participation Restrictions Cleaning;Community Activity;Shop;Driving;Meal Prep;Occupation    Stability/Clinical Decision Making Stable/Uncomplicated    Rehab Potential Good    PT Frequency 2x / week    PT Duration 6 weeks    PT Treatment/Interventions ADLs/Self Care Home Management;Cryotherapy;Functional mobility training;Electrical Stimulation;Iontophoresis 2m/ml Dexamethasone;Moist Heat;Traction;Therapeutic exercise;Therapeutic activities;DME Instruction;Ultrasound;Neuromuscular re-education;Patient/family education;Manual techniques;Taping;Dry needling;Passive range of motion;Visual/perceptual remediation/compensation;Joint Manipulations;Spinal Manipulations    PT Home Exercise Plan [email or web address]     Consulted and Agree with Plan of Care Patient  Patient will benefit from skilled therapeutic intervention in order to improve the following deficits and impairments:  Decreased activity tolerance, Decreased strength, Pain, Increased muscle spasms, Decreased mobility, Decreased range of motion, Improper body mechanics, Impaired flexibility  Visit Diagnosis: Other muscle spasm  Cervicalgia     Problem List 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 [place], [place], [address] Phone: [phone]   Fax:  [phone]  Name: DEIDRA SPEASE MRN: [id number] Date of Birth: Feb 03, 1976   PHYSICAL THERAPY DISCHARGE SUMMARY  Visits from Start of Care:9 Plan: Patient agrees to discharge.  Patient goals were partially met. Patient is being discharged due to meeting the stated rehab goals.     Lyndee Hensen, PT, DPT 2:22 PM  11/13/21

## 2021-10-21 NOTE — Therapy (Signed)
[facility] [address] [place], [place], [address] Phone: [phone]   Fax:  [phone]  Physical Therapy Treatment  Patient Details  Name: Victoria Maddox MRN: [id number] Date of Birth: 11-25-1975 Referring Provider (PT): Lynne Leader   Encounter Date: 10/07/2021   PT End of Session - 10/21/21 2117     Visit Number 8    Number of Visits 20    Date for PT Re-Evaluation 10/30/21    Authorization Type [facility]     Recert done at visit 6.    PT Start Time 1217    PT Stop Time 1300    PT Time Calculation (min) 43 min    Activity Tolerance Patient tolerated treatment well    Behavior During Therapy WFL for tasks assessed/performed             Past Medical History:  Diagnosis Date   Allergy    Depression    Family history of breast cancer    Family history of kidney cancer    Family history of throat cancer    Frequent headaches    Heart murmur    History of recurrent UTIs    Hypertension    Migraines    Personal history of radiation therapy    Urinary incontinence     Past Surgical History:  Procedure Laterality Date   BREAST BIOPSY Left 01/29/2020   BREAST BIOPSY Left 02/06/2020   BREAST LUMPECTOMY Left 03/12/2020   BREAST LUMPECTOMY WITH RADIOACTIVE SEED LOCALIZATION Left 03/12/2020   Procedure: LEFT BREAST LUMPECTOMY WITH BRACKETED RADIOACTIVE SEED LOCALIZATION;  Surgeon: Rolm Bookbinder, MD;  Location: [facility];  Service: General;  Laterality: Left;   DILATION AND CURETTAGE OF UTERUS      There were no vitals filed for this visit.   Subjective Assessment - 10/21/21 2116     Subjective Pt states improving tightness in UTs, but R shoulder blade still sore.    Patient Stated Goals decreased tightness and pain    Currently in Pain? Yes    Pain Score 5     Pain Location Neck    Pain Orientation Right    Pain Descriptors / Indicators Aching;Tightness    Pain Type Chronic pain    Pain  Onset More than a month ago    Pain Frequency Intermittent                               [facility] Adult PT Treatment/Exercise - 10/21/21 2121       Neck Exercises: Theraband   Rows 20 reps;Green    Rows Limitations low row , RTB x15    Shoulder External Rotation 20 reps    Shoulder External Rotation Limitations RTB      Neck Exercises: Standing   Other Standing Exercises wall angel x15, full shoulder flexion x 10, abd x 10;    Other Standing Exercises wall push ups x 20;      Neck Exercises: Stretches   Corner Stretch 3 reps;30 seconds    Corner Stretch Limitations doorway      Moist Heat Therapy   Moist Heat Location Cervical      Manual Therapy   Manual therapy comments --    Joint Mobilization Cervical and upper thoracic  PA mobs.    Soft tissue mobilization DTM/TPR to R levator, rhomboid,    Manual Traction cervical distraction 10 sec x 10;  PT Education - 10/21/21 2116     Education Details reviewed HEP    Person(s) Educated Patient    Methods Explanation;Demonstration;Tactile cues;Verbal cues;Handout    Comprehension Verbalized understanding;Returned demonstration;Verbal cues required;Tactile cues required;Need further instruction              PT Short Term Goals - 09/18/21 1147       PT SHORT TERM GOAL #1   Title Pt to be indepndent with initial HEP    Time 2    Period Weeks    Status Achieved    Target Date 08/15/21               PT Long Term Goals - 09/18/21 1148       PT LONG TERM GOAL #1   Title Pt to be independent with final HEP    Time 6    Period Weeks    Status Partially Met    Target Date 10/30/21      PT LONG TERM GOAL #2   Title Pt to report decreased pain in neck to 0-2/10 with work and activities.    Time 6    Period Weeks    Status On-going    Target Date 10/30/21      PT LONG TERM GOAL #3   Title Pt to demo improved soft tissue limitations to be WNL in shoulders and  neck region.    Time 6    Period Weeks    Status On-going    Target Date 10/30/21      PT LONG TERM GOAL #4   Title Pt to demo optimal posture in sititng and standing, to improve muscle tension and pain with activity and work duties.    Time 6    Period Weeks    Status Partially Met    Target Date 10/30/21                   Plan - 10/21/21 2118     Clinical Impression Statement Pt with most soreness at medial scap border and rhomboid. Continued manual tissue release for pain and tightness, as well as cervical mobilization and distraction for pain. Pt with good ability for exercises, but does require max cuing for optimal mechanics, and to decrease tension in UTs.    Personal Factors and Comorbidities Time since onset of injury/illness/exacerbation    Examination-Activity Limitations Sit;Bend;Carry;Reach Overhead;Lift    Examination-Participation Restrictions Cleaning;Community Activity;Shop;Driving;Meal Prep;Occupation    Stability/Clinical Decision Making Stable/Uncomplicated    Rehab Potential Good    PT Frequency 2x / week    PT Duration 6 weeks    PT Treatment/Interventions ADLs/Self Care Home Management;Cryotherapy;Functional mobility training;Electrical Stimulation;Iontophoresis 42m/ml Dexamethasone;Moist Heat;Traction;Therapeutic exercise;Therapeutic activities;DME Instruction;Ultrasound;Neuromuscular re-education;Patient/family education;Manual techniques;Taping;Dry needling;Passive range of motion;Visual/perceptual remediation/compensation;Joint Manipulations;Spinal Manipulations    PT Home Exercise Plan [email or web address]    Consulted and Agree with Plan of Care Patient             Patient will benefit from skilled therapeutic intervention in order to improve the following deficits and impairments:  Decreased activity tolerance, Decreased strength, Pain, Increased muscle spasms, Decreased mobility, Decreased range of motion, Improper body mechanics, Impaired  flexibility  Visit Diagnosis: Cervicalgia  Other muscle spasm     Problem List Patient Active Problem List   Diagnosis Date Noted   Prediabetes 07/08/2021   Shoulder pain, bilateral 07/08/2021   Hypertriglyceridemia 07/08/2021   GERD (gastroesophageal reflux disease) 07/08/2021   Upper back pain 07/08/2021   Genetic testing 02/22/2020  Family history of breast cancer    Family history of throat cancer    Family history of kidney cancer    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

## 2021-10-21 NOTE — Patient Instructions (Signed)
Access Code: [email or web address] URL: [email or web address] Date: 10/21/2021 Prepared by: Lyndee Hensen  Exercises Seated Upper Trapezius Stretch - 2 x daily - 3 reps - 30 hold Seated Levator Scapulae Stretch - 2 x daily - 3 reps - 30 hold Standing Scapular Retraction - 2 x daily - 1-2 sets - 10 reps Standing Backward Shoulder Rolls - 2 x daily - 1 sets - 10 reps Doorway Pec Stretch at 90 Degrees Abduction - 2 x daily - 3 reps - 30 hold Wall Angels - 1 x daily - 1 sets - 10 reps Cat Cow - 2 x daily - 1 sets - 10 reps Standing Row with Anchored Resistance - 1 x daily - 2 sets - 10 reps Standing Shoulder External Rotation with Resistance - 1 x daily - 2 sets - 10 reps

## 2021-10-22 LAB — QUANTIFERON-TB GOLD PLUS (RQFGPL)
QuantiFERON Mitogen Value: >10 IU/mL
QuantiFERON Nil Value: 0.04 IU/mL
QuantiFERON TB1 Ag Value: 0.05 IU/mL
QuantiFERON TB2 Ag Value: 0.09 IU/mL

## 2021-10-22 LAB — QUANTIFERON-TB GOLD PLUS: QuantiFERON-TB Gold Plus: NEGATIVE

## 2021-10-28 ENCOUNTER — Encounter: Payer: No Typology Code available for payment source | Admitting: Physical Therapy

## 2021-11-05 ENCOUNTER — Encounter: Payer: No Typology Code available for payment source | Admitting: Physical Therapy

## 2021-11-11 ENCOUNTER — Telehealth: Payer: No Typology Code available for payment source | Admitting: Physician Assistant

## 2021-11-11 ENCOUNTER — Other Ambulatory Visit (HOSPITAL_COMMUNITY): Payer: Self-pay

## 2021-11-11 DIAGNOSIS — R3989 Other symptoms and signs involving the genitourinary system: Secondary | ICD-10-CM

## 2021-11-11 MED ORDER — SULFAMETHOXAZOLE-TRIMETHOPRIM 800-160 MG PO TABS
1.0000 | ORAL_TABLET | Freq: Two times a day (BID) | ORAL | 0 refills | Status: DC
  Filled 2021-11-11: qty 10, 5d supply, fill #0

## 2021-11-11 NOTE — Progress Notes (Signed)

## 2021-11-21 ENCOUNTER — Telehealth: Payer: Self-pay | Admitting: Family Medicine

## 2021-11-21 NOTE — Telephone Encounter (Signed)
Pt was transferred to our office through an [facility] nurse. Pt was experiencing nausea, dizziness, and had almost fainted. She was told by the nurse to get some rest and drink some water is what the pt stated to me. She was advised to go to Urgent care. She stated she will call back to schedule with [place].

## 2021-11-24 NOTE — Telephone Encounter (Signed)
Unable to contact patient, voice mail not set up yet

## 2021-11-24 NOTE — Telephone Encounter (Signed)
See nnote

## 2021-11-24 NOTE — Telephone Encounter (Signed)
Called Pt, she stated she went to Urgent Care but declined to wait. She is scheduled to come in our office on 11/25/21 at 9:30am with [facility].   Patient Name: Victoria Maddox Gender: Female DOB: May 07, 1976 Age: 46 Y 3 M 12 D Return Phone Number: [phone] (Primary) Address: City/ State/ Zip: [place]  [address] Client [place] [facility] [place] [facility] [place] [facility] [place] [facility] Provider Dimas Chyle- MD Contact Type Call Who Is Calling Patient / Member / Family / Caregiver Call Type Triage / Clinical Caller Name Victoria Maddox Relationship To Patient Spouse Return Phone Number [phone] (Primary) Chief Complaint Dizziness Reason for Call Symptomatic / Request for [place] is calling for his wife who is having dizziness, nausea. Translation No Nurse Assessment Nurse: Alvis Lemmings, RN, Marcie Bal Date/Time Eilene Ghazi Time): 11/21/2021 3:30:18 PM Confirm and document reason for call. If symptomatic, describe symptoms. ---Caller is calling for his wife who is having dizziness, nausea. Started a few mos. ago. Saw the Dr and now last 2 weeks its on and off. Almost fainted when she went to shower, in the shower. Sat down as every thing got dark. Heart was racing. Does the patient have any new or worsening symptoms? ---Yes Will a triage be completed? ---Yes Related visit to physician within the last 2 weeks? ---No Does the PT have any chronic conditions? (i.e. diabetes, asthma, this includes High risk factors for pregnancy, etc.) ---Yes List chronic conditions. ---tamoxifen for year after dx with breast Cancer April 2022, HTN on verapamil, decreased to one pill, cough, Is the patient pregnant or possibly pregnant? (Ask all females between the ages of 67-55) ---No Is this a behavioral health or substance abuse call? ---No Guidelines Guideline Title Affirmed Question Affirmed Notes Nurse  Date/Time Eilene Ghazi Time) Dizziness - Lightheadedness Taking a medicine that could cause Etter Sjogren 11/21/2021 3:35:05 PM  Guidelines Guideline Title Affirmed Question Affirmed Notes Nurse Date/Time Eilene Ghazi Time) dizziness (e.g., blood pressure medications, diuretics) Disp. Time Eilene Ghazi Time) Disposition Final User 11/21/2021 3:40:25 PM See PCP within 24 Hours Yes Alvis Lemmings, RN, Lenon Oms Disagree/Comply Comply Caller Understands Yes PreDisposition Call Doctor Care Advice Given Per Guideline SEE PCP WITHIN 24 HOURS: * IF OFFICE WILL BE OPEN: You need to be examined within the next 24 hours. Call your doctor (or NP/PA) when the office opens and make an appointment. MEDICINE AS A CAUSE: * Your medicine may or may not be causing your dizziness. Your doctor can help you decide. DRINK FLUIDS: * Drink several glasses of fruit juice, other clear fluids or water. * This will improve hydration and blood glucose. LIE DOWN AND REST: * Lie down with feet elevated for 1 hour. * This will improve circulation and increase blood flow to the brain. COOL OFF: * If the weather is hot, apply a cold compress to the forehead. * Or take a cool shower or bath. CARE ADVICE given per Dizziness (Adult) guideline. CALL BACK IF: * Passes out (faints) * You become worse Referrals REFERRED TO PCP OFFICE

## 2021-11-25 ENCOUNTER — Other Ambulatory Visit: Payer: Self-pay

## 2021-11-25 ENCOUNTER — Ambulatory Visit: Payer: No Typology Code available for payment source | Admitting: Family

## 2021-11-25 ENCOUNTER — Ambulatory Visit (INDEPENDENT_AMBULATORY_CARE_PROVIDER_SITE_OTHER): Payer: No Typology Code available for payment source | Admitting: Family Medicine

## 2021-11-25 VITALS — BP 150/92 | HR 89 | Ht 63.0 in | Wt 125.4 lb

## 2021-11-25 DIAGNOSIS — G8929 Other chronic pain: Secondary | ICD-10-CM | POA: Diagnosis not present

## 2021-11-25 DIAGNOSIS — M542 Cervicalgia: Secondary | ICD-10-CM

## 2021-11-25 NOTE — Patient Instructions (Addendum)
I've referred you to see Dr. Ernestina Patches to evaluate for possible facet injections  Recheck back with me as needed

## 2021-11-25 NOTE — Progress Notes (Signed)
I, Peterson Lombard, LAT, ATC acting as a scribe for Lynne Leader, MD.  Victoria Maddox is a 46 y.o. female who presents to [place] [facility] today for f/u trapezius muscle spasm/neck pain. Pt was last seen by Dr. Georgina Snell on 09/03/21 and was advised to proceed to MRI to further characterize the cause of pain and for potential facet injection planning. Today, pt reports pain in her neck and upper back is still there. Pt completed 9 visits of PT, the last of which was on 10/21/21. Pt locates pain to bilat trapz and bilat neck.   Dx imaging: 09/23/21 C-spine MRI  07/17/21 C-spine XR  Pertinent review of systems: No fevers or chills  Relevant historical information: Pretension.  History of breast cancer   Exam:  BP (!) 150/92    Pulse 89    Ht 5\' 3"  (1.6 m)    Wt 125 lb 6.4 oz (56.9 kg)    SpO2 98%    BMI 22.21 kg/m  General: Well Developed, well nourished, and in no acute distress.   MSK: C-spine: Normal-appearing Nontender midline. Tender palpation inferior cervical paraspinal musculature into the trapezius. Upper extremity strength is intact.    Lab and Radiology Results  EXAM: CERVICAL SPINE - 2-3 VIEW   COMPARISON:  None.   FINDINGS: The cervical spine is visualized from C1-the superior endplate of C7. 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 Vertebral body heights are maintained: no evidence of acute fracture. There is RIGHT sided robust calcific densities of the mid cervical spine likely reflecting intervertebral disc spaces are relatively preserved. Severe facet arthropathy. No prevertebral soft tissue swelling. Visualized thorax is unremarkable.   IMPRESSION: 1. 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 2. Exuberant osteophyte proliferation along the RIGHT aspect of the mid cervical spine likely reflecting severe facet arthropathy.     Electronically Signed   By: Valentino Saxon M.D.   On: 07/19/2021 12:04       EXAM: MRI CERVICAL SPINE WITHOUT CONTRAST   TECHNIQUE: Multiplanar, multisequence MR imaging of the cervical spine was performed. No intravenous contrast was administered.   COMPARISON:  Cervical spine radiographs 07/17/2021   FINDINGS: Alignment: Normal   Vertebrae: Normal bone marrow.  Negative for fracture or mass   Cord: Normal signal and morphology   Posterior Fossa, vertebral arteries, paraspinal tissues: Negative   Disc levels:   C2-3: Negative   C3-4: Mild disc degeneration and uncinate spurring. Mild foraminal narrowing bilaterally   C4-5: Central disc protrusion. Diffuse uncinate spurring. Cord flattening with mild spinal stenosis. Mild to moderate foraminal stenosis bilaterally.   C5-6: Mild disc degeneration and spurring. Mild foraminal narrowing bilaterally   C6-7: Negative   C7-T1: Negative   IMPRESSION: Mild foraminal narrowing bilaterally at C3-4 due to spurring.   Mild spinal stenosis mild to moderate foraminal stenosis bilaterally at C4-5 due to disc protrusion and spurring.   Mild foraminal narrowing bilaterally at C5-6 due to spurring.     Electronically Signed   By: Franchot Gallo M.D.   On: 09/24/2021 14:49  I, Lynne Leader, personally (independently) visualized and performed the interpretation of the images attached in this note. 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 female with bilateral inferior cervical neck pain/trapezius pain.  This is a chronic issue that is failed conservative management trial of PT and medication.  X-ray was concerning for facet DJD.  Her cervical spine MRI does not not comment on her facet joints.  Per my read there is some concern for facet DJD.  I think at this point she is a good candidate for trial of facet injections.  However given the complexity of this I think referral to [facility] is a great option.  She could have different kinds of injection and I did  in-depth discussion of the pros and cons.  Plan to refer to Dr. Ernestina Patches.   PDMP not reviewed this encounter. Orders Placed This Encounter  Procedures   Ambulatory referral to Physical Medicine Rehab    Referral Priority:   Routine    Referral Type:   Rehabilitation    Referral Reason:   Specialty Services Required    Requested Specialty:   Physical Medicine and Rehabilitation    Number of Visits Requested:   1   No orders of the defined types were placed in this encounter.    Discussed warning signs or symptoms. Please see discharge instructions. Patient expresses understanding.   The above documentation has been reviewed and is accurate and complete Lynne Leader, M.D.

## 2021-11-26 ENCOUNTER — Ambulatory Visit: Payer: No Typology Code available for payment source | Admitting: Family Medicine

## 2021-11-27 ENCOUNTER — Ambulatory Visit: Payer: No Typology Code available for payment source | Admitting: Family Medicine

## 2021-11-27 NOTE — Progress Notes (Incomplete)
° °  Victoria Maddox is a 46 y.o. female who presents today for an office visit.  Assessment/Plan:  New/Acute Problems: ***  Chronic Problems Addressed Today: No problem-specific Assessment & Plan notes found for this encounter.     Subjective:  HPI:  Patient here with episode of dizziness and nausea.        Objective:  Physical Exam: There were no vitals taken for this visit.  Gen: No acute distress, resting comfortably*** CV: Regular rate and rhythm with no murmurs appreciated Pulm: Normal work of breathing, clear to auscultation bilaterally with no crackles, wheezes, or rhonchi Neuro: Grossly normal, moves all extremities Psych: Normal affect and thought content       I,Savera Zaman,acting as a scribe for Dimas Chyle, MD.,have documented all relevant documentation on the behalf of Dimas Chyle, MD,as directed by  Dimas Chyle, MD while in the presence of Dimas Chyle, MD.   *** Algis Greenhouse. Jerline Pain, MD 11/27/2021 12:23 PM

## 2021-12-09 ENCOUNTER — Other Ambulatory Visit: Payer: Self-pay

## 2021-12-09 ENCOUNTER — Encounter: Payer: Self-pay | Admitting: Physical Medicine and Rehabilitation

## 2021-12-09 ENCOUNTER — Ambulatory Visit (INDEPENDENT_AMBULATORY_CARE_PROVIDER_SITE_OTHER): Payer: No Typology Code available for payment source | Admitting: Physical Medicine and Rehabilitation

## 2021-12-09 VITALS — BP 171/88 | HR 71

## 2021-12-09 DIAGNOSIS — M47812 Spondylosis without myelopathy or radiculopathy, cervical region: Secondary | ICD-10-CM

## 2021-12-09 DIAGNOSIS — M542 Cervicalgia: Secondary | ICD-10-CM | POA: Diagnosis not present

## 2021-12-09 DIAGNOSIS — M7918 Myalgia, other site: Secondary | ICD-10-CM | POA: Diagnosis not present

## 2021-12-09 NOTE — Progress Notes (Signed)
Victoria Maddox - 46 y.o. female MRN [id number]  Date of birth: 05-Oct-1976  Office Visit Note: Visit Date: 12/09/2021 PCP: Vivi Barrack, MD Referred by: Vivi Barrack, MD  Subjective: Chief Complaint  Patient presents with   Neck - Pain   Right Shoulder - Pain   Left Shoulder - Pain   HPI: Victoria Maddox is a 46 y.o. female who comes in today per the request of Dr. Lynne Leader for evaluation of bilateral neck pain radiating to upper back region. Patient reports pain has been ongoing for several years. She states pain is exacerbated by movement and activity, describes as constant soreness, currently rates as 7 out of 10. Patient reports some relief of pain with massage therapy, rest and use of medications. Patient reports recent formal physical therapy/dry needling at [facility] and reports some relief of pain with these treatments. Patient reports severe pain makes it difficult for her to sleep and does continue to take Zanaflex at bedtime. Patient also reports use of Tylenol/Ibuprofen as needed for pain. Patients cervical MRI from 2022 exhibits multi-level facet hypertrophy, most severe at C5-C6. There is also mild foraminal narrowing bilaterally at C3-C4 and C5-C6, mild spinal canal stenosis noted at C4-C5. No high grade spinal canal stenosis noted. Patient states she has tried many different types of treatments in the past, however nothing seems to give her lasting pain relief. Patient is a very active person and reports her severe pain is making it difficult to complete job duties. Patient was recently seen by Dr. Lynne Leader at [place] [facility] whom recommended possible facet joint injections. Patient denies focal weakness, numbness and tingling. Patient denies recent trauma or falls.   Review of Systems  Musculoskeletal:  Positive for myalgias and neck pain.  Neurological:  Negative for tingling, sensory change, focal weakness and weakness.  All other  systems reviewed and are negative. Otherwise per HPI.  Assessment & Plan: Visit Diagnoses:    ICD-10-CM   1. Cervicalgia  M54.2 Ambulatory referral to Physical Medicine Rehab    2. Bilateral neck pain  M54.2     3. 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 Myofascial pain syndrome  M79.18        Plan: Findings:  Chronic, worsening and severe bilateral neck pain radiating to upper back region. Patient continues to have excruciating and debilitating pain despite good conservative therapies such as formal physical therapy, dry needling, massage therapy and medications. Patients clinical presentation and exam are consistent with facet mediated pain.  We also feel there could be a myofascial component contributing to her pain as she does have multiple palpable trigger points to middle and upper back. She does have severe pain when turning her neck from side to side upon exam.  We believe the next step is to perform diagnostic and hopefully therapeutic bilateral C5-C6 facet joint injections under fluoroscopic guidance. If her pain persists after facet joint injections we would consider regrouping with our in-house physical therapy team. Patient encouraged to remain active.  No red flag symptoms noted upon exam today.   Meds & Orders: No orders of the defined types were placed in this encounter.   Orders Placed This Encounter  Procedures   Ambulatory referral to Physical Medicine Rehab    Follow-up: Return for Bilateral C5-C6 facet joint injections.   Procedures: No procedures performed      Clinical History: MRI CERVICAL SPINE WITHOUT CONTRAST   TECHNIQUE:  Multiplanar, multisequence MR imaging of the cervical spine was performed. No intravenous contrast was administered.   COMPARISON:  Cervical spine radiographs 07/17/2021   FINDINGS: Alignment: Normal   Vertebrae: Normal bone marrow.  Negative for fracture or mass   Cord: Normal signal and morphology   Posterior  Fossa, vertebral arteries, paraspinal tissues: Negative   Disc levels:   C2-3: Negative   C3-4: Mild disc degeneration and uncinate spurring. Mild foraminal narrowing bilaterally   C4-5: Central disc protrusion. Diffuse uncinate spurring. Cord flattening with mild spinal stenosis. Mild to moderate foraminal stenosis bilaterally.   C5-6: Mild disc degeneration and spurring. Mild foraminal narrowing bilaterally   C6-7: Negative   C7-T1: Negative   IMPRESSION: Mild foraminal narrowing bilaterally at C3-4 due to spurring.   Mild spinal stenosis mild to moderate foraminal stenosis bilaterally at C4-5 due to disc protrusion and spurring.   Mild foraminal narrowing bilaterally at C5-6 due to spurring.     Electronically Signed   By: Franchot Gallo M.D.   On: 09/24/2021 14:49   She reports that she has never smoked. She has never used smokeless tobacco.  Recent Labs    07/01/21 0000  HGBA1C 6.2    Objective:  VS:  HT:     WT:    BMI:      BP: (!) 171/88   HR:71bpm   TEMP: ( )   RESP:  Physical Exam Vitals and nursing note reviewed.  HENT:     Head: Normocephalic and atraumatic.     Right Ear: External ear normal.     Left Ear: External ear normal.     Nose: Nose normal.     Mouth/Throat:     Mouth: Mucous membranes are moist.  Eyes:     Extraocular Movements: Extraocular movements intact.  Cardiovascular:     Rate and Rhythm: Normal rate.     Pulses: Normal pulses.  Pulmonary:     Effort: Pulmonary effort is normal.  Abdominal:     General: Abdomen is flat. There is no distension.  Musculoskeletal:        General: Tenderness present.     Cervical back: Tenderness present.     Comments: Discomfort noted with flexion, extension and side-to-side rotation. Patient has good strength in the upper extremities including 5 out of 5 strength in wrist extension, long finger flexion and APB.  There is no atrophy of the hands intrinsically. Sensation intact bilaterally.  Negative Hoffman's sign.   Skin:    General: Skin is warm and dry.     Capillary Refill: Capillary refill takes less than 2 seconds.  Neurological:     General: No focal deficit present.     Mental Status: She is alert and oriented to person, place, and time.  Psychiatric:        Mood and Affect: Mood normal.        Behavior: Behavior normal.    Ortho Exam  Imaging: No results found.  Past Medical/Family/Surgical/Social History: Medications & Allergies reviewed per EMR, new medications updated. 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  HTN (hypertension) 05/04/2018   Insomnia 05/04/2018   Adjustment disorder with mixed anxiety and depressed mood 05/04/2018   Past Medical History:  Diagnosis Date   Allergy    Depression    Family history of breast cancer    Family history of kidney cancer    Family history of throat cancer    Frequent headaches    Heart murmur    History of recurrent UTIs    Hypertension    Migraines    Personal history of radiation therapy    Urinary incontinence    Family History  Problem Relation Age of Onset   Heart attack Mother    Heart disease Mother    Hypertension Mother    Early death Father        unknown cause   Hypertension Father    Hypertension Sister    Diabetes Brother    Hypertension Brother    Asthma Sister    Hypertension Sister    Hyperlipidemia Brother    Hypertension Brother    Mental illness Brother    Heart disease Sister    Hypertension Sister    Arthritis Brother    Diabetes Brother    Hypertension Brother    Breast cancer Cousin  54       paternal first cousin   Throat cancer Sister 108       non-smoker   Breast cancer Cousin        dx. in her 67s, paternal first cousin once removed   Kidney cancer Other 2   Past Surgical History:  Procedure Laterality Date   BREAST BIOPSY Left 01/29/2020   BREAST BIOPSY Left 02/06/2020   BREAST LUMPECTOMY Left 03/12/2020   BREAST LUMPECTOMY WITH RADIOACTIVE SEED LOCALIZATION Left 03/12/2020   Procedure: LEFT BREAST LUMPECTOMY WITH BRACKETED RADIOACTIVE SEED LOCALIZATION;  Surgeon: Rolm Bookbinder, MD;  Location: [facility];  Service: General;  Laterality: Left;   DILATION AND CURETTAGE OF UTERUS     Social History   Occupational History   Not on file  Tobacco Use   Smoking status: Never   Smokeless tobacco: Never  Vaping Use   Vaping Use: Never used  Substance and Sexual Activity   Alcohol use: Not Currently   Drug use: Never   Sexual activity: Yes    Partners: Male

## 2021-12-09 NOTE — Progress Notes (Signed)
Pt state neck pain that travels to both shoulders. Pt state any movement makes the pain worse. Pt state she message and uses heat to help ease her pain. Pt state she takes over the counter pain meds to help ease sum pain.  Numeric Pain Rating Scale and Functional Assessment Average Pain 9 Pain Right Now 5 My pain is constant, dull, and aching Pain is worse with: bending, standing, and some activites Pain improves with: heat/ice, therapy/exercise, and medication   In the last MONTH (on 0-10 scale) has pain interfered with the following?  1. General activity like being  able to carry out your everyday physical activities such as walking, climbing stairs, carrying groceries, or moving a chair?  Rating(7)  2. Relation with others like being able to carry out your usual social activities and roles such as  activities at home, at work and in your community. Rating(8)  3. Enjoyment of life such that you have  been bothered by emotional problems such as feeling anxious, depressed or irritable?  Rating(9)

## 2021-12-12 ENCOUNTER — Other Ambulatory Visit (HOSPITAL_COMMUNITY): Payer: Self-pay

## 2021-12-26 ENCOUNTER — Encounter: Payer: Self-pay | Admitting: Family Medicine

## 2021-12-26 ENCOUNTER — Other Ambulatory Visit (HOSPITAL_COMMUNITY): Payer: Self-pay

## 2021-12-26 ENCOUNTER — Ambulatory Visit (INDEPENDENT_AMBULATORY_CARE_PROVIDER_SITE_OTHER): Payer: No Typology Code available for payment source | Admitting: Family Medicine

## 2021-12-26 ENCOUNTER — Other Ambulatory Visit: Payer: Self-pay

## 2021-12-26 VITALS — BP 169/103 | HR 82 | Temp 98.3°F | Ht 63.0 in | Wt 129.6 lb

## 2021-12-26 DIAGNOSIS — G43909 Migraine, unspecified, not intractable, without status migrainosus: Secondary | ICD-10-CM | POA: Diagnosis not present

## 2021-12-26 DIAGNOSIS — I10 Essential (primary) hypertension: Secondary | ICD-10-CM

## 2021-12-26 MED ORDER — VERAPAMIL HCL ER 180 MG PO TBCR
360.0000 mg | EXTENDED_RELEASE_TABLET | Freq: Every morning | ORAL | 3 refills | Status: DC
  Filled 2021-12-26: qty 180, 90d supply, fill #0
  Filled 2022-08-14: qty 6, 3d supply, fill #1
  Filled 2022-08-17: qty 174, 87d supply, fill #1

## 2021-12-26 MED ORDER — AMOXICILLIN-POT CLAVULANATE 875-125 MG PO TABS
1.0000 | ORAL_TABLET | Freq: Two times a day (BID) | ORAL | 0 refills | Status: DC
  Filled 2021-12-26: qty 14, 7d supply, fill #0

## 2021-12-26 MED ORDER — PROMETHAZINE-DM 6.25-15 MG/5ML PO SYRP
5.0000 mL | ORAL_SOLUTION | Freq: Four times a day (QID) | ORAL | 0 refills | Status: DC | PRN
  Filled 2021-12-26: qty 118, 6d supply, fill #0

## 2021-12-26 NOTE — Progress Notes (Signed)
° °  Victoria Maddox is a 46 y.o. female who presents today for an office visit.  Assessment/Plan:  New/Acute Problems: Cough / Sinusitis No red flags.  Given length of symptoms we will start Augmentin.  We discussed prednisone burst however she declined for now.  We will start promethazine-dextromethorphan for cough.  We discussed potential side effects.  She will let me know if not improving.  She does have some nasal turbinate hypertrophy.  If this continues to be an issue would consider referral to ENT.  We discussed reasons to return to care.  Dizziness No red flags.  No current symptoms.  Symptoms are episodic in nature.  Could be related to her high blood pressure or sinusitis.  She will check her blood pressure at home next time this happens.  Encourage good oral hydration.   Chronic Problems Addressed Today: Migraine Not controlled.  She has been taking over-the-counter analgesics almost daily.  She may have some component of medication overuse headaches as well.  We will be increasing her dose of verapamil as below.  Hopefully she will have some improvement as we treat her sinus infection as well.  She will follow-up with me in a couple of weeks and we can adjust the medications as needed.  May need referral to headache clinic.  HTN (hypertension) Not controlled.  We will increase verapamil to 360 mg daily.  She will follow-up in a few weeks via MyChart.  We discussed home blood pressure monitoring.     Subjective:  HPI:  Patient here with cough for the last week. Cough is non productive. Occurs mostly at night. Usually uses inhaler and cough medicine. This helps a little. Cough seems to be improving.  No reported fevers or chills.  No reported chest pain or shortness of breath.  Occasionally feels lightheaded and dizzy.  This has been going on for several weeks.  Migraines seem to be worsening. She has a history of chronic migraines. Seems to be daily for the last week. No  congestion or rhinorrhea. Has some photophobia. She has tried several OTC medications which helps some. She takes OTC analgesics almost every day.       Objective:  Physical Exam: BP (!) 169/103 (BP Location: Left Arm)    Pulse 82    Temp 98.3 F (36.8 C) (Temporal)    Ht 5\' 3"  (1.6 m)    Wt 129 lb 9.6 oz (58.8 kg)    SpO2 97%    BMI 22.96 kg/m   Gen: No acute distress, resting comfortably HEENT: OP erythematous without exudate.  Nasal mucosa erythematous and boggy bilaterally with turbinate hypertrophy CV: Regular rate and rhythm with no murmurs appreciated Pulm: Normal work of breathing, clear to auscultation bilaterally with no crackles, wheezes, or rhonchi Neuro: Grossly normal, moves all extremities Psych: Normal affect and thought content      Preethi Scantlebury M. Jerline Pain, MD 12/26/2021 9:18 AM

## 2021-12-26 NOTE — Assessment & Plan Note (Signed)
Not controlled.  We will increase verapamil to 360 mg daily.  She will follow-up in a few weeks via MyChart.  We discussed home blood pressure monitoring.

## 2021-12-26 NOTE — Assessment & Plan Note (Signed)
Not controlled.  She has been taking over-the-counter analgesics almost daily.  She may have some component of medication overuse headaches as well.  We will be increasing her dose of verapamil as below.  Hopefully she will have some improvement as we treat her sinus infection as well.  She will follow-up with me in a couple of weeks and we can adjust the medications as needed.  May need referral to headache clinic.

## 2021-12-26 NOTE — Patient Instructions (Signed)
It was very nice to see you today!  I am concerned that you may have a sinus infection.  Please start the Augmentin.  Please take the cough syrup at night.  Let me know if not improving.  Please increase your verapamil to twice per day.  Please send me a message in a few weeks to let me know how your migraines and your blood pressures are looking.  Take care, Dr Jerline Pain  PLEASE NOTE:  If you had any lab tests please let us know if you have not heard back within a few days. You may see your results on mychart before we have a chance to review them but we will give you a call once they are reviewed by [place]. If we ordered any referrals today, please let us know if you have not heard from their office within the next week.   Please try these tips to maintain a healthy lifestyle:  Eat at least 3 REAL meals and 1-2 snacks per day.  Aim for no more than 5 hours between eating.  If you eat breakfast, please do so within one hour of getting up.   Each meal should contain half fruits/vegetables, one quarter protein, and one quarter carbs (no bigger than a computer mouse)  Cut down on sweet beverages. This includes juice, soda, and sweet tea.   Drink at least 1 glass of water with each meal and aim for at least 8 glasses per day  Exercise at least 150 minutes every week.

## 2021-12-29 ENCOUNTER — Other Ambulatory Visit (HOSPITAL_COMMUNITY): Payer: Self-pay

## 2021-12-31 ENCOUNTER — Other Ambulatory Visit: Payer: Self-pay

## 2021-12-31 ENCOUNTER — Ambulatory Visit: Payer: Self-pay

## 2021-12-31 ENCOUNTER — Ambulatory Visit (INDEPENDENT_AMBULATORY_CARE_PROVIDER_SITE_OTHER): Payer: No Typology Code available for payment source | Admitting: Physical Medicine and Rehabilitation

## 2021-12-31 VITALS — BP 151/82 | HR 70

## 2021-12-31 DIAGNOSIS — M47812 Spondylosis without myelopathy or radiculopathy, cervical region: Secondary | ICD-10-CM | POA: Diagnosis not present

## 2021-12-31 MED ORDER — BUPIVACAINE HCL 0.5 % IJ SOLN: 3.0000 mL | Freq: Once | INTRAMUSCULAR | Status: DC

## 2021-12-31 NOTE — Progress Notes (Signed)
Chronic neck pain  Increased over the last year 6 / 10 pain  No contrast allergy

## 2021-12-31 NOTE — Patient Instructions (Signed)

## 2022-01-14 ENCOUNTER — Encounter: Payer: Self-pay | Admitting: Physical Medicine and Rehabilitation

## 2022-01-15 ENCOUNTER — Other Ambulatory Visit: Payer: Self-pay | Admitting: Physical Medicine and Rehabilitation

## 2022-01-15 DIAGNOSIS — M542 Cervicalgia: Secondary | ICD-10-CM

## 2022-01-15 DIAGNOSIS — M47812 Spondylosis without myelopathy or radiculopathy, cervical region: Secondary | ICD-10-CM

## 2022-01-18 NOTE — Procedures (Signed)
Diagnostic Cervical Facet Joint Nerve Block with Fluoroscopic Guidance  Patient: Victoria Maddox      Date of Birth: December 31, 1975 MRN: [id number] PCP: Vivi Barrack, MD      Visit Date: 12/31/2021   Universal Protocol:    Date/Time: 03/12/239:22 AM  Consent Given By: the patient  Position: PRONE  Additional Comments: Vital signs were monitored before and after the procedure. Patient was prepped and draped in the usual sterile fashion. The correct patient, procedure, and site was verified.   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 The beam was then moved in a small counter oblique to the contralateral side with a small amount of caudal tilt to achieve a trajectory alignment with the desired nerves.  For each target described below the skin was anesthetized with 1 ml of 1% Lidocaine without epinephrine.  To block the facet joint nerves from C3 through C7, the lateral masses of these respective levels were localized under fluoroscopic visualization.  A spinal needle was inserted down to the "waist" at the above mentioned cervical levels.  The  needle was then "walked off" until it rested just lateral to the trough of the lateral mass of the medial branch nerve, which innervates the cervical facet joint.  After contact with periosteum and negative aspirate for blood and CSF,  correct placement without intravascular or epidural spread was confirmed by Bi-planar images and  injecting 0.5 ml. of Omnipaque-240.  A spot radiograph was obtained of this image.  Next, a 0.5 ml. volume of 1% Lidocaine without Epinephrine was then injected.  Prior to the procedure, the patient was given a Pain Diary which was completed for baseline measurements.  After the procedure, the patient rated their pain every 30 minutes and will continue rating at this frequency for a total of 5 hours.  The patient has been asked to complete the Diary and return to [place] by mail, fax or hand delivered as soon as possible.   Additional Comments:  No complications occurred Dressing: Band-Aid    Post-procedure details: Patient was observed during the procedure. Post-procedure instructions were reviewed.  Patient left the clinic in stable condition.

## 2022-01-18 NOTE — Progress Notes (Signed)
Victoria Maddox - 46 y.o. female MRN [id number]  Date of birth: [birth year]  Office Visit Note: Visit Date: 12/31/2021 PCP: Vivi Barrack, MD Referred by: Vivi Barrack, MD  Subjective: Chief Complaint  Patient presents with   Neck - Pain   Right Shoulder - Pain   Left Shoulder - Pain   HPI:  Victoria Maddox is a 46 y.o. female who comes in today at the request of Barnet Pall, FNP and Dr. Lynne Leader for planned Bilateral  C5-6 Cervical facet/medial branch block with fluoroscopic guidance.  The patient has failed conservative care including home exercise, medications, time and activity modification.  This injection will be diagnostic and hopefully therapeutic.  Please see requesting physician notes for further details and justification.  Exam has shown concordant pain with facet joint loading.  ROS Otherwise per HPI.  Assessment & Plan: Visit Diagnoses:    ICD-10-CM   1. Facet hypertrophy of cervical region  M47.812 XR C-ARM NO REPORT    Facet Injection    bupivacaine (MARCAINE) 0.5 % (with pres) injection 3 mL      Plan: No additional findings.   Meds & Orders:  Meds ordered this encounter  Medications   bupivacaine (MARCAINE) 0.5 % (with pres) injection 3 mL    Orders Placed This Encounter  Procedures   Facet Injection   XR C-ARM NO REPORT    Follow-up: Return for Review Pain Diary.   Procedures: No procedures performed  Diagnostic Cervical Facet Joint Nerve Block with Fluoroscopic Guidance  Patient: Victoria Maddox      Date of Birth: 07/31/1976 MRN: [id number] PCP: Vivi Barrack, MD      Visit Date: 12/31/2021   Universal Protocol:    Date/Time: 03/12/239:22 AM  Consent Given By: the patient  Position: PRONE  Additional Comments: Vital signs were monitored before and after the procedure. Patient was prepped and draped in the usual sterile fashion. The correct patient, procedure, and site was verified.   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 The beam was then moved in a small counter oblique to the contralateral side with a small amount of caudal tilt to achieve a trajectory alignment with the desired nerves.  For each target described below the skin was anesthetized with 1 ml of 1% Lidocaine without epinephrine.  To block the facet joint nerves from C3 through C7, the lateral masses of these respective levels were localized under fluoroscopic visualization.  A spinal needle was inserted down to the "waist" at the above mentioned cervical levels.  The  needle was then "walked off" until it rested just lateral to the trough of the lateral mass of the medial branch nerve, which innervates the cervical facet joint.  After contact with periosteum and negative aspirate for blood and CSF, correct placement without intravascular or epidural spread was confirmed by Bi-planar images and  injecting 0.5 ml.  of Omnipaque-240.  A spot radiograph was obtained of this image.  Next, a 0.5 ml. volume of 1% Lidocaine without Epinephrine was then injected.  Prior to the procedure, the patient was given a Pain Diary which was completed for baseline measurements.  After the procedure, the patient rated their pain every 30 minutes and will continue rating at this frequency for a total of 5 hours.  The patient has been asked to complete  the Diary and return to [place] by mail, fax or hand delivered as soon as possible.   Additional Comments:  No complications occurred Dressing: Band-Aid    Post-procedure details: Patient was observed during the procedure. Post-procedure instructions were reviewed.  Patient left the clinic in stable condition.       Clinical History: No specialty comments available.     Objective:  VS:  HT:     WT:    BMI:      BP:(!) 151/82   HR:70bpm   TEMP: ( )   RESP:  Physical Exam Vitals and nursing note reviewed.  Constitutional:      General: She is not in acute distress.    Appearance: Normal appearance. She is not ill-appearing.  HENT:     Head: Normocephalic and atraumatic.     Right Ear: External ear normal.     Left Ear: External ear normal.  Eyes:     Extraocular Movements: Extraocular movements intact.  Cardiovascular:     Rate and Rhythm: Normal rate.     Pulses: Normal pulses.  Musculoskeletal:     Cervical back: Tenderness present. No rigidity.     Right lower leg: No edema.     Left lower leg: No edema.     Comments: Patient has good strength in the upper extremities including 5 out of 5 strength in wrist extension long finger flexion and APB.  There is no atrophy of the hands intrinsically.  There is a negative Hoffmann's test.   Lymphadenopathy:     Cervical: No cervical adenopathy.  Skin:    Findings: No erythema, lesion or rash.  Neurological:     General: No focal deficit present.     Mental Status: She is alert and oriented to person, place, and time.     Sensory: No sensory deficit.     Motor: No weakness or abnormal muscle tone.     Coordination: Coordination normal.  Psychiatric:        Mood and Affect: Mood normal.        Behavior: Behavior normal.     Imaging: No results found.

## 2022-01-26 ENCOUNTER — Other Ambulatory Visit: Payer: Self-pay

## 2022-01-26 DIAGNOSIS — D0512 Intraductal carcinoma in situ of left breast: Secondary | ICD-10-CM

## 2022-01-27 ENCOUNTER — Other Ambulatory Visit: Payer: Self-pay

## 2022-01-27 ENCOUNTER — Inpatient Hospital Stay: Payer: No Typology Code available for payment source | Attending: Hematology and Oncology

## 2022-01-27 DIAGNOSIS — Z79810 Long term (current) use of selective estrogen receptor modulators (SERMs): Secondary | ICD-10-CM | POA: Insufficient documentation

## 2022-01-27 DIAGNOSIS — D0512 Intraductal carcinoma in situ of left breast: Secondary | ICD-10-CM | POA: Insufficient documentation

## 2022-01-27 DIAGNOSIS — Z923 Personal history of irradiation: Secondary | ICD-10-CM | POA: Insufficient documentation

## 2022-01-27 LAB — CBC WITH DIFFERENTIAL (CANCER CENTER ONLY)
Abs Immature Granulocytes: 0.01 10*3/uL (ref 0.00–0.07)
Basophils Absolute: 0 10*3/uL (ref 0.0–0.1)
Basophils Relative: 1 %
Eosinophils Absolute: 0.1 10*3/uL (ref 0.0–0.5)
Eosinophils Relative: 2 %
HCT: 33.9 % — ABNORMAL LOW (ref 36.0–46.0)
Hemoglobin: 11.3 g/dL — ABNORMAL LOW (ref 12.0–15.0)
Immature Granulocytes: 0 %
Lymphocytes Relative: 43 %
Lymphs Abs: 2 10*3/uL (ref 0.7–4.0)
MCH: 27.1 pg (ref 26.0–34.0)
MCHC: 33.3 g/dL (ref 30.0–36.0)
MCV: 81.3 fL (ref 80.0–100.0)
Monocytes Absolute: 0.6 10*3/uL (ref 0.1–1.0)
Monocytes Relative: 13 %
Neutro Abs: 1.9 10*3/uL (ref 1.7–7.7)
Neutrophils Relative %: 41 %
Platelet Count: 297 10*3/uL (ref 150–400)
RBC: 4.17 MIL/uL (ref 3.87–5.11)
RDW: 14.6 % (ref 11.5–15.5)
WBC Count: 4.7 10*3/uL (ref 4.0–10.5)
nRBC: 0 % (ref 0.0–0.2)

## 2022-01-27 LAB — CMP (CANCER CENTER ONLY)
ALT: 13 U/L (ref 0–44)
AST: 14 U/L — ABNORMAL LOW (ref 15–41)
Albumin: 4.1 g/dL (ref 3.5–5.0)
Alkaline Phosphatase: 72 U/L (ref 38–126)
Anion gap: 4 — ABNORMAL LOW (ref 5–15)
BUN: 17 mg/dL (ref 6–20)
CO2: 32 mmol/L (ref 22–32)
Calcium: 9.6 mg/dL (ref 8.9–10.3)
Chloride: 103 mmol/L (ref 98–111)
Creatinine: 0.74 mg/dL (ref 0.44–1.00)
GFR, Estimated: >60 mL/min (ref >60)
Glucose, Bld: 99 mg/dL (ref 70–99)
Potassium: 3.9 mmol/L (ref 3.5–5.1)
Sodium: 139 mmol/L (ref 135–145)
Total Bilirubin: 0.3 mg/dL (ref 0.3–1.2)
Total Protein: 7.5 g/dL (ref 6.5–8.1)

## 2022-02-03 ENCOUNTER — Ambulatory Visit (HOSPITAL_COMMUNITY)
Admission: RE | Admit: 2022-02-03 | Discharge: 2022-02-03 | Disposition: A | Discharge status: Home / Self Care | Payer: No Typology Code available for payment source | Source: Ambulatory Visit | Attending: Hematology and Oncology | Admitting: Hematology and Oncology

## 2022-02-03 ENCOUNTER — Other Ambulatory Visit: Payer: Self-pay

## 2022-02-03 ENCOUNTER — Encounter (HOSPITAL_COMMUNITY): Payer: Self-pay

## 2022-02-03 DIAGNOSIS — R911 Solitary pulmonary nodule: Secondary | ICD-10-CM | POA: Diagnosis present

## 2022-02-03 IMAGING — CT CT CHEST W/ CM
2 of 3 series · 15 of 36 positions shown, 18 images · IV contrast (agent unspecified)
Comparison: CT [DATE]

CLINICAL DATA: Follow-up pulmonary nodule. History of breast
cancer, radiation therapy complete

EXAM:
CT CHEST WITH CONTRAST
TECHNIQUE: Multidetector CT imaging of the chest was performed during
intravenous contrast administration.

[Series 2: axial st · axial · 0.58mm/px · z∈[+1468,+1712]mm · 12 of 144 slices shown, 15 images]
[im 11/144  mediastinal]
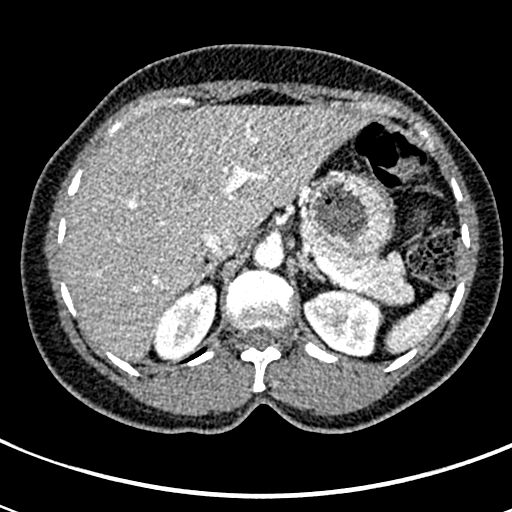
[im 11/144  lung]
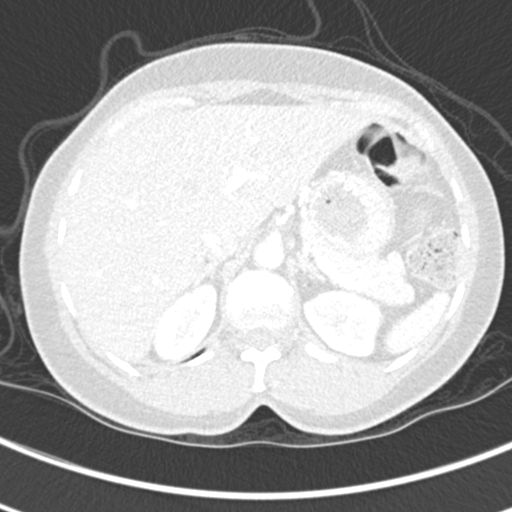
[im 22/144  lung]
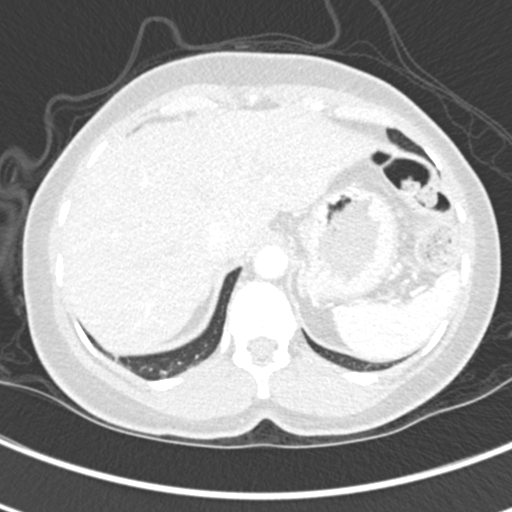
[im 32/144  lung]
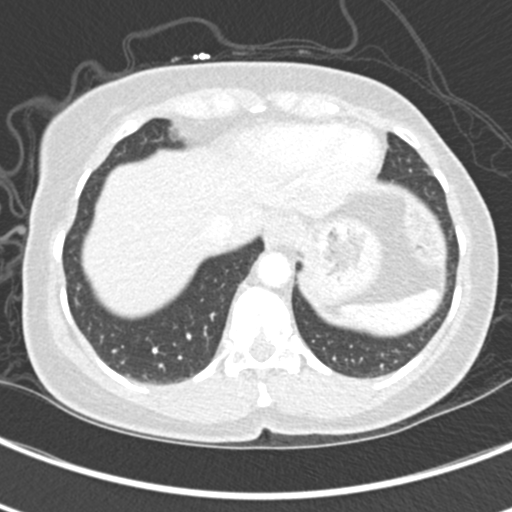
[im 43/144  lung]
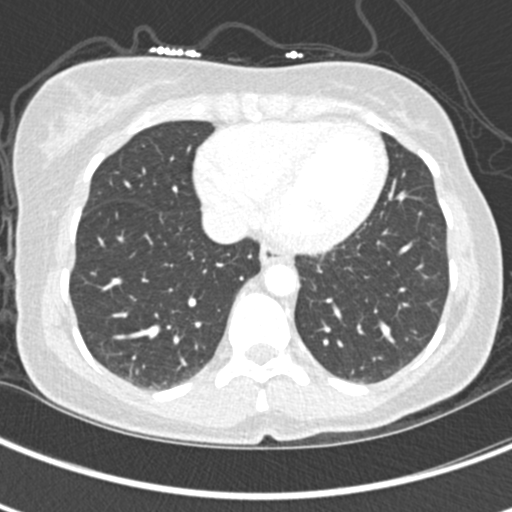
[im 53/144  mediastinal]
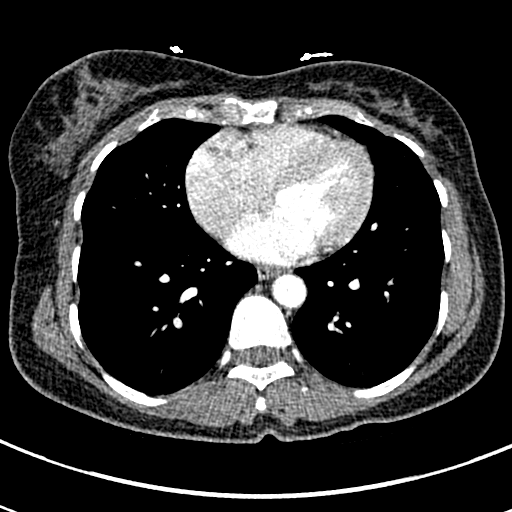
[im 53/144  lung]
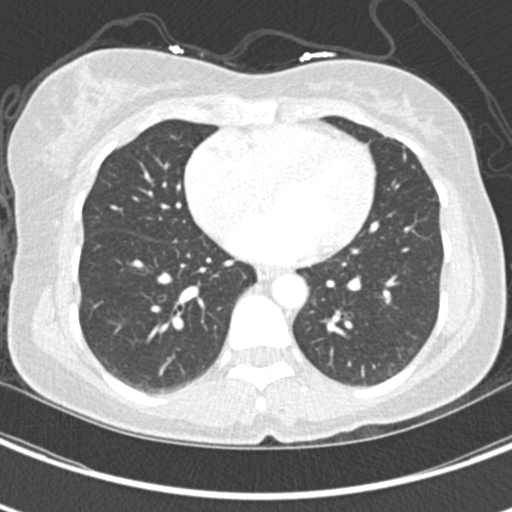
[im 64/144  lung]
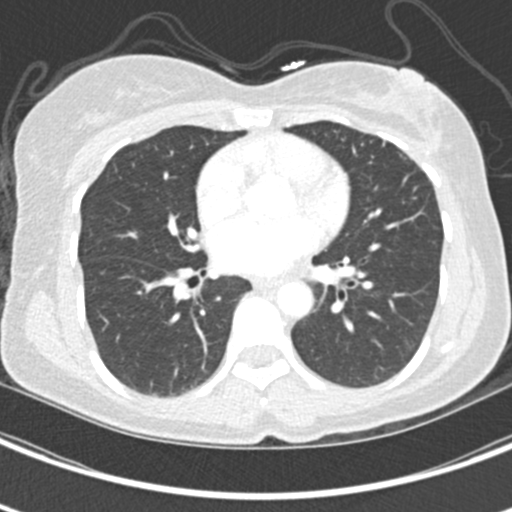
[im 80/144  lung]
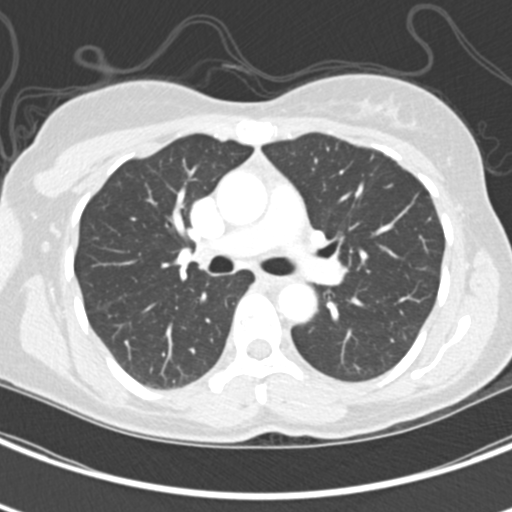
[im 91/144  lung]
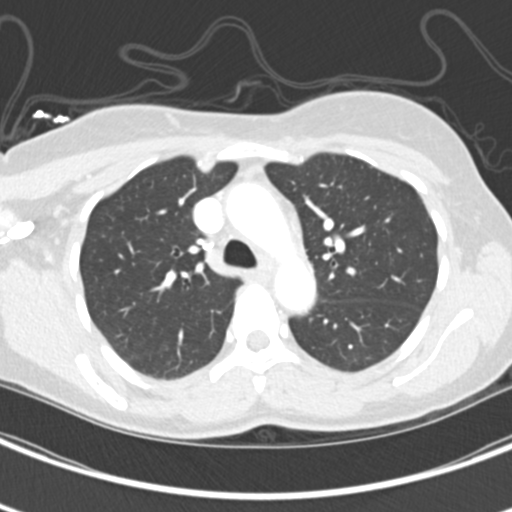
[im 101/144  mediastinal]
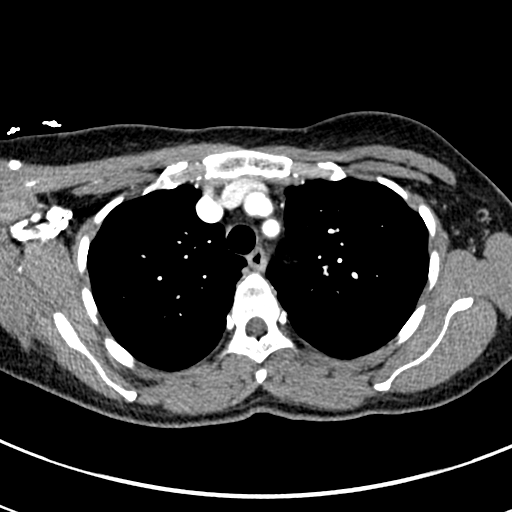
[im 101/144  lung]
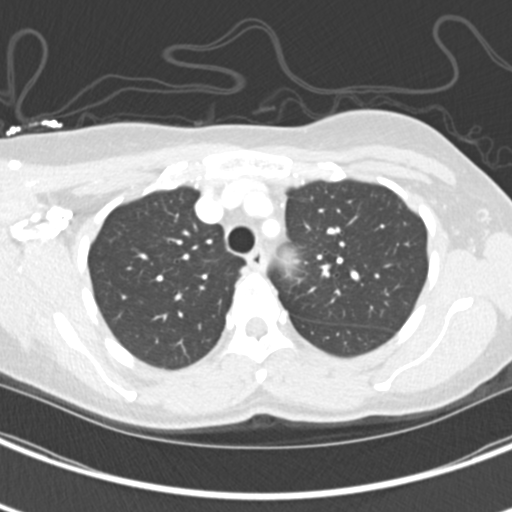
[im 112/144  lung]
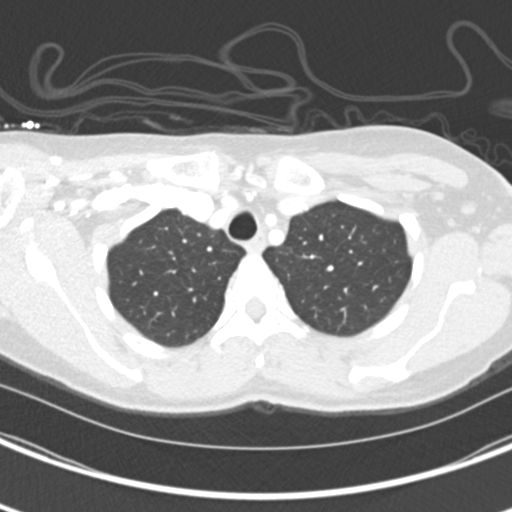
[im 122/144  lung]
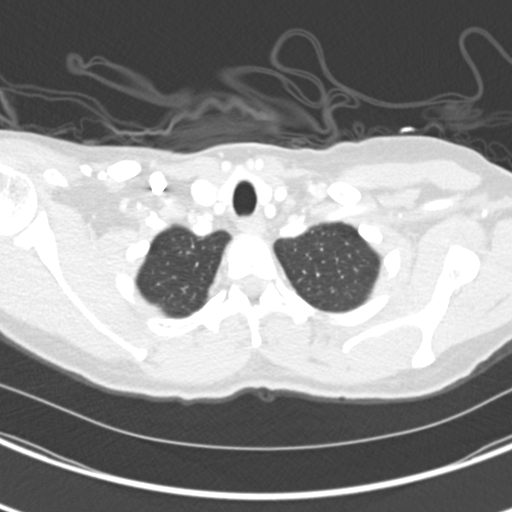
[im 133/144  lung]
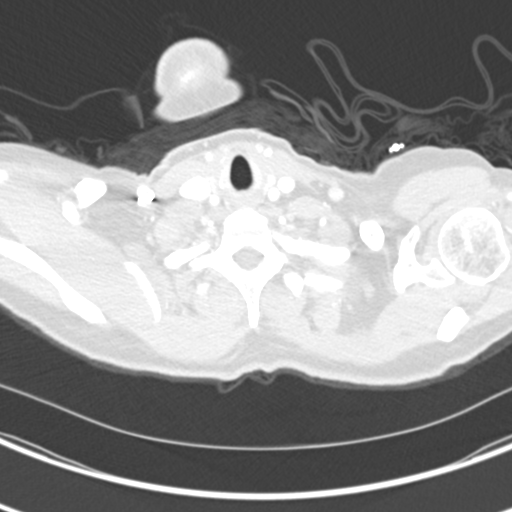

[Series 6: coronal · coronal · 0.54mm/px · 3 of 121 slices shown]
[im 25/121  lung]
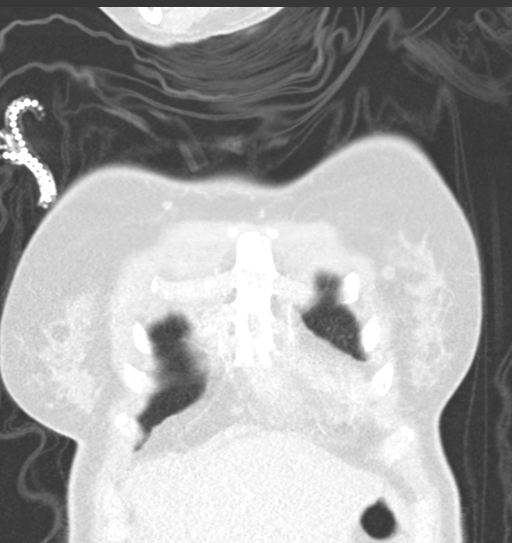
[im 49/121  lung]
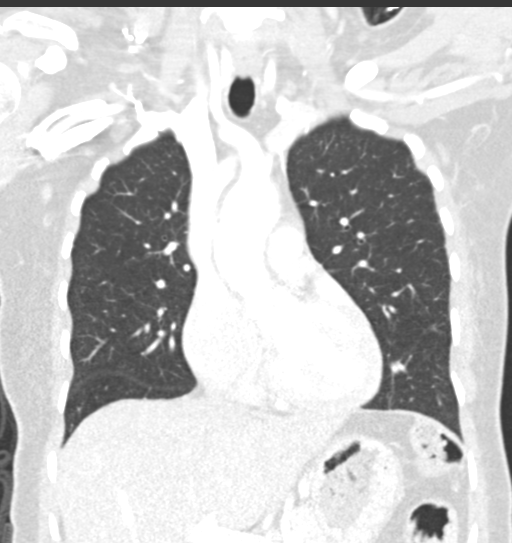
[im 73/121  lung]
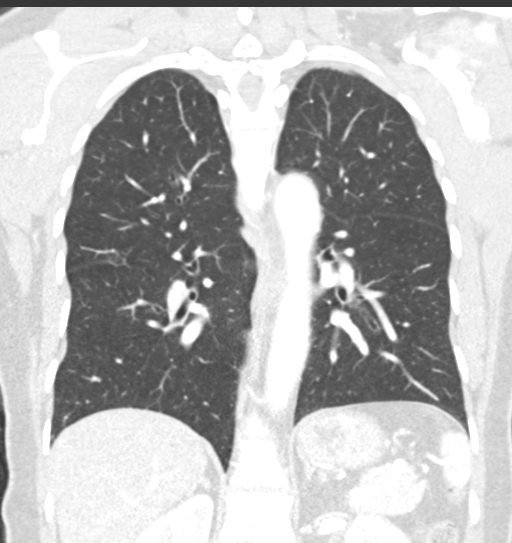

[15 of 36 positions shown; findings below may reference images not displayed]

RADIATION DOSE REDUCTION: This exam was performed according to the
departmental dose-optimization program which includes automated
exposure control, adjustment of the mA and/or kV according to
patient size and/or use of iterative reconstruction technique.

CONTRAST:  75mL OMNIPAQUE IOHEXOL 300 MG/ML  SOLN
FINDINGS: Cardiovascular: Normal caliber thoracic aorta. No central pulmonary
embolus on this nondedicated study. Normal size heart. No
significant pericardial effusion/thickening.

Mediastinum/Nodes: No discrete thyroid nodule. No pathologically
enlarged mediastinal, hilar or axillary lymph nodes. Esophagus is
grossly unremarkable.

Lungs/Pleura: Mild biapical pleuroparenchymal scarring is unchanged
from prior. Right upper lobe pulmonary nodule measures 2 mm on image
62/5 previously 3 mm. 2 mm right upper lobe pulmonary nodule on
image 47/5 is unchanged from prior. No new suspicious pulmonary
nodules or masses. No focal airspace consolidation. No pleural
effusion. No pneumothorax.

Upper Abdomen: No acute abnormality.

Musculoskeletal: Left chest biopsy clips. No aggressive lytic or
blastic lesion of bone.
IMPRESSION: Stable tiny right upper lobe pulmonary nodules, which are favored
benign. Continued attention on follow-up imaging suggested.

## 2022-02-03 MED ORDER — IOHEXOL 300 MG/ML  SOLN
75.0000 mL | Freq: Once | INTRAMUSCULAR | Status: AC | PRN
  Administered 2022-02-03: 75 mL via INTRAVENOUS

## 2022-03-12 ENCOUNTER — Telehealth: Payer: Self-pay | Admitting: Family Medicine

## 2022-03-12 NOTE — Telephone Encounter (Signed)
Vml for pt to call back and sch a fu for : hypertension with Dr. Jerline Pain ?

## 2022-03-13 ENCOUNTER — Other Ambulatory Visit (HOSPITAL_COMMUNITY): Payer: Self-pay

## 2022-03-19 ENCOUNTER — Telehealth: Payer: Self-pay | Admitting: Hematology and Oncology

## 2022-03-19 NOTE — Telephone Encounter (Signed)
Rescheduled appointment per provider PAL. Patient is aware. 

## 2022-03-20 ENCOUNTER — Telehealth: Payer: Self-pay | Admitting: Physical Medicine and Rehabilitation

## 2022-03-20 NOTE — Telephone Encounter (Signed)
Pt called requesting a call back for an appt for neck pain. Please call pt at [phone]. ?

## 2022-03-20 NOTE — Telephone Encounter (Signed)
Patient scheduled 5/18

## 2022-03-23 ENCOUNTER — Other Ambulatory Visit: Payer: Self-pay | Admitting: Hematology and Oncology

## 2022-03-23 ENCOUNTER — Ambulatory Visit
Admission: RE | Admit: 2022-03-23 | Discharge: 2022-03-23 | Disposition: A | Discharge status: Home / Self Care | Payer: No Typology Code available for payment source | Source: Ambulatory Visit | Attending: Hematology and Oncology | Admitting: Hematology and Oncology

## 2022-03-23 DIAGNOSIS — N6310 Unspecified lump in the right breast, unspecified quadrant: Secondary | ICD-10-CM

## 2022-03-23 DIAGNOSIS — D0512 Intraductal carcinoma in situ of left breast: Secondary | ICD-10-CM

## 2022-03-23 IMAGING — US US BREAST*R* LIMITED INC AXILLA
1 series · 13 of 15 positions shown · non-contrast
Comparison: Prior exams

CLINICAL DATA: Short-term follow-up for right breast
calcifications. Patient has a history of a lumpectomy for left
breast DCIS in [WP].

EXAM:
DIGITAL DIAGNOSTIC BILATERAL MAMMOGRAM WITH TOMOSYNTHESIS AND CAD;
ULTRASOUND RIGHT BREAST LIMITED
TECHNIQUE: Bilateral digital diagnostic mammography and breast tomosynthesis
was performed. The images were evaluated with computer-aided
detection.; Targeted ultrasound examination of the right breast was
performed

[Series 1: us breast*right* limited inc axilla · 0.06mm/px · 13 of 15 slices shown]
[im 1/15]
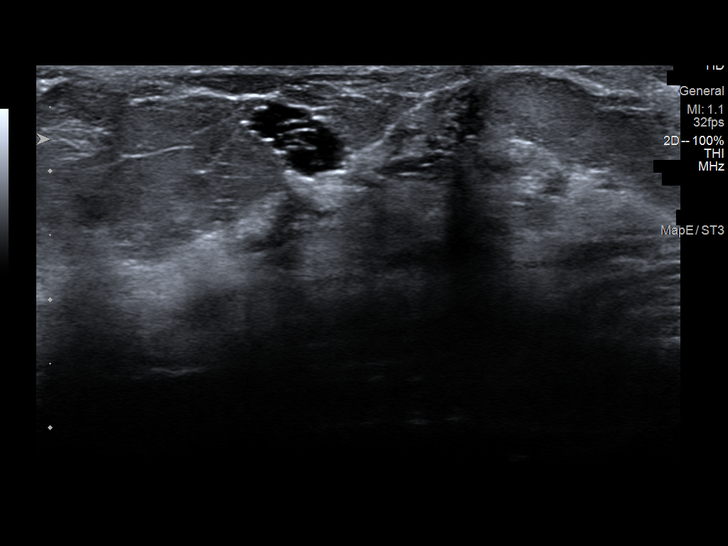
[im 2/15]
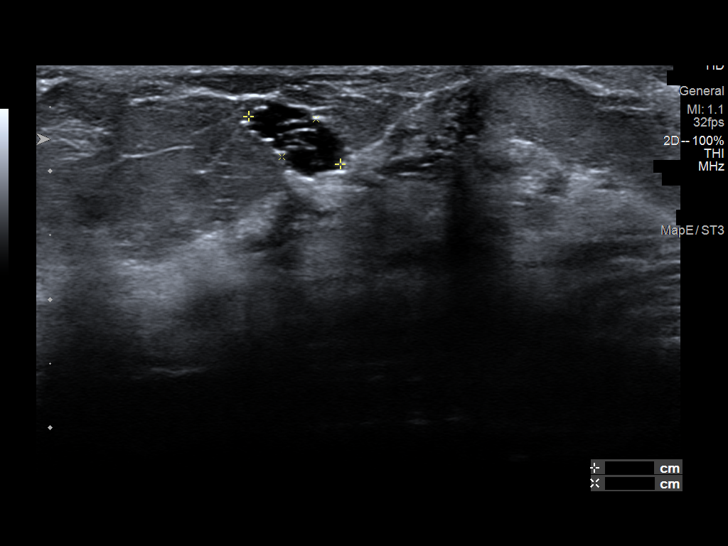
[im 3/15]
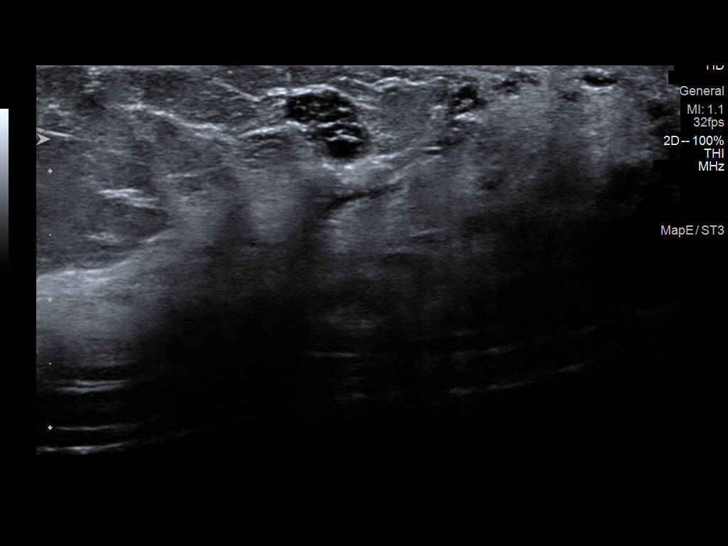
[im 5/15]
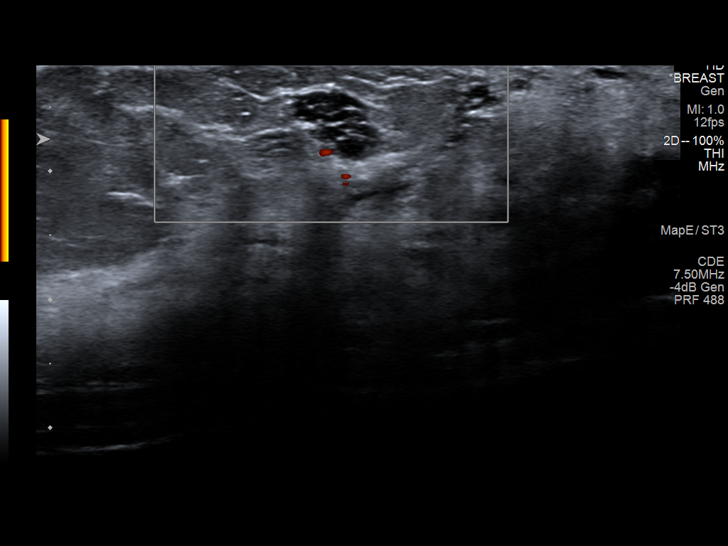
[im 6/15]
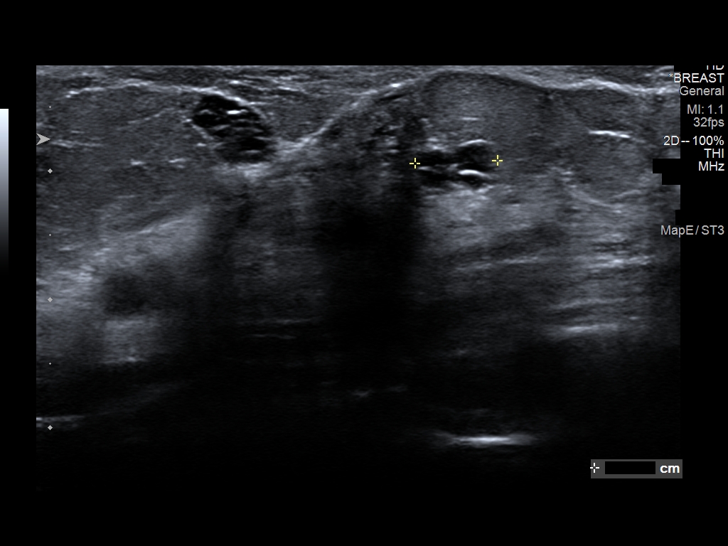
[im 7/15]
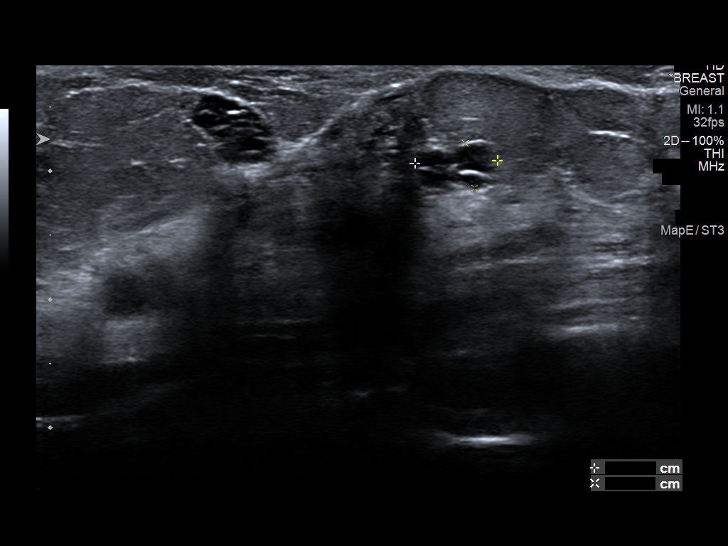
[im 8/15]
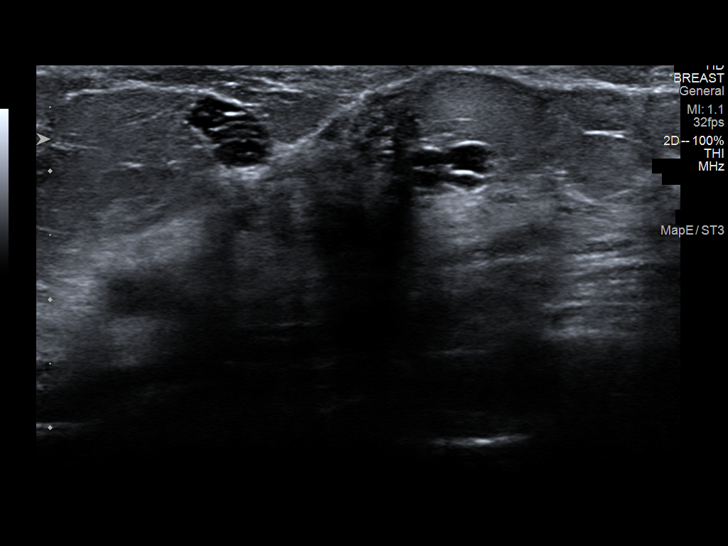
[im 9/15]
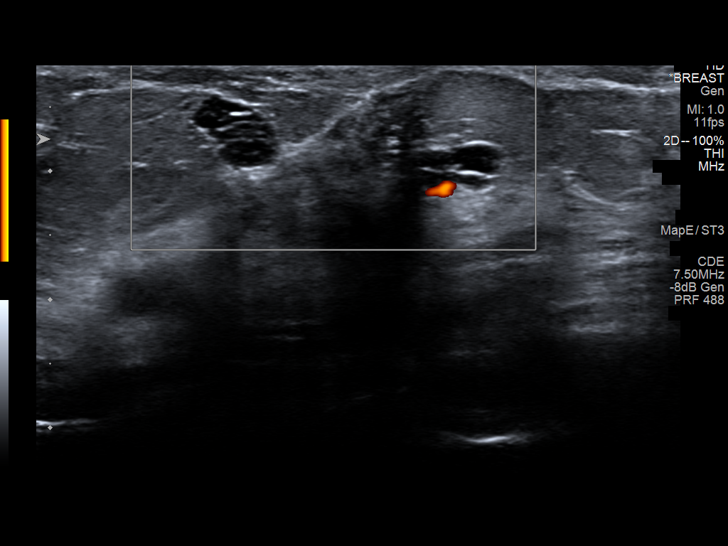
[im 10/15]
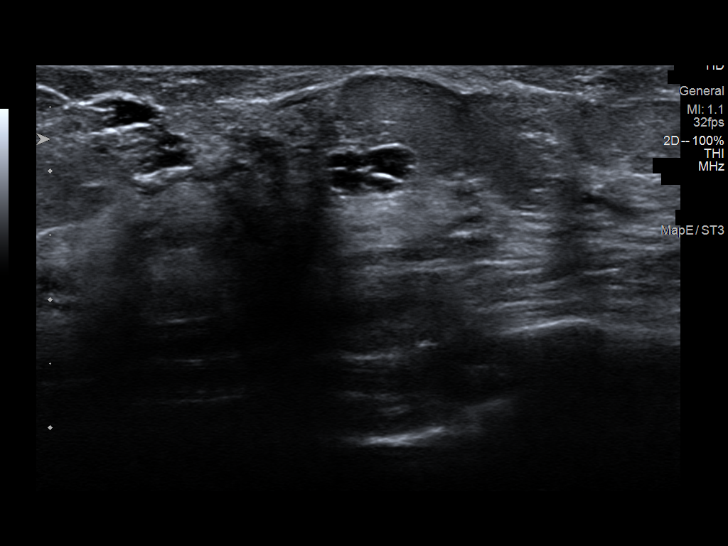
[im 11/15]
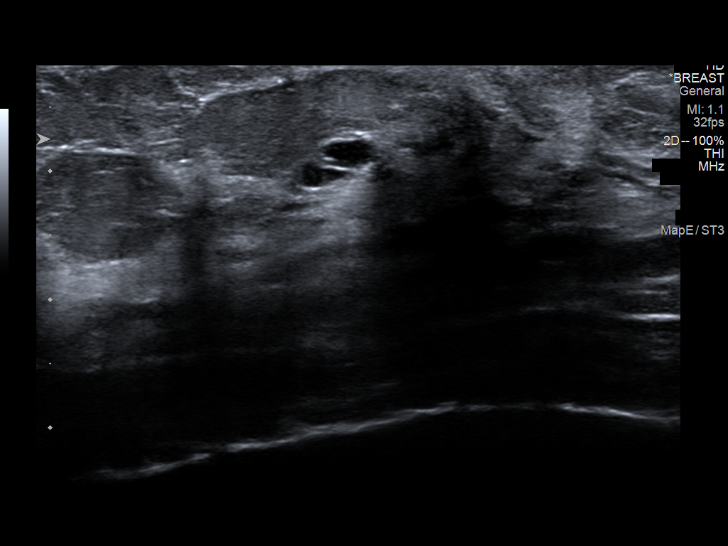
[im 13/15]
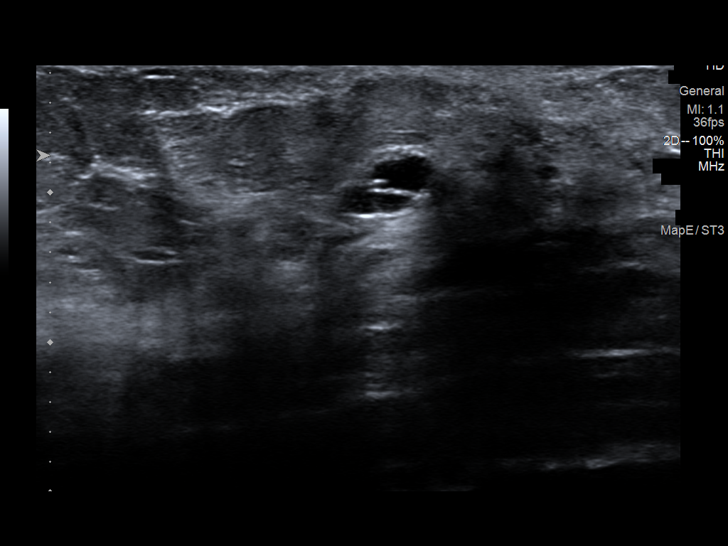
[im 14/15]
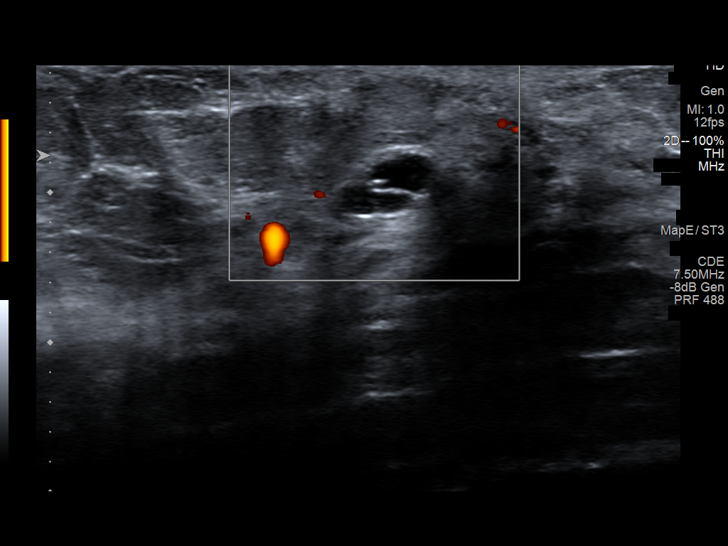
[im 15/15]
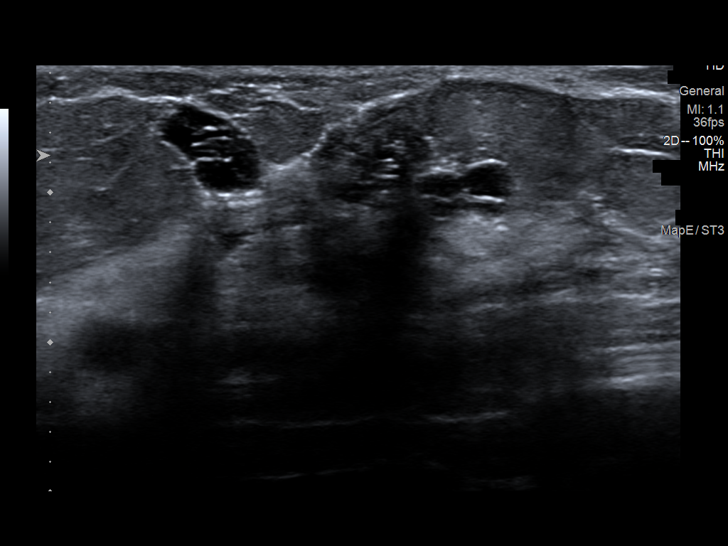

[13 of 15 positions shown; findings below may reference images not displayed]

ACR Breast Density Category c: The breast tissue is heterogeneously
dense, which may obscure small masses.
FINDINGS: In the left breast, post lumpectomy changes, as well as coil and
ribbon shaped post biopsy marker clips around the lumpectomy bed,
are stable as are associated benign lumpectomy bed calcifications.
There are no left breast masses, areas of nonsurgical architectural
distortion or new or suspicious calcifications.

In the right breast, the subtle group of calcifications in the
posterior, upper right breast are stable. There are no new
calcifications. There is a possible mass in the anterior, upper
outer right breast near the nipple. No other defined masses and no
areas of architectural distortion.

Targeted right breast ultrasound is performed, showing 2 adjacent
cysts with thin internal septations, but no significant complicating
features, measuring 8 and 6 mm in size, located at 9 o'clock,
retroareolar, consistent with the adjacent masses noted
mammographically. There are no solid masses or suspicious lesions.
IMPRESSION: 1. Probably benign right breast calcifications, stable for 1 year.
2. Benign cysts in the 9 o'clock retroareolar right breast.
3. Benign post lumpectomy changes on the left.

RECOMMENDATION:
1. Diagnostic mammography in 1 year with right breast magnification
views. This would document 2 years of stability for the probably
benign right breast calcifications.

I have discussed the findings and recommendations with the patient.
If applicable, a reminder letter will be sent to the patient
regarding the next appointment.

BI-RADS CATEGORY  3: Probably benign.

## 2022-03-23 IMAGING — MG DIGITAL DIAGNOSTIC BILAT W/ TOMO W/ CAD
8 of 12 series · 8 of 28 positions shown · non-contrast
Comparison: Prior exams

CLINICAL DATA: Short-term follow-up for right breast
calcifications. Patient has a history of a lumpectomy for left
breast DCIS in [WP].

EXAM:
DIGITAL DIAGNOSTIC BILATERAL MAMMOGRAM WITH TOMOSYNTHESIS AND CAD;
ULTRASOUND RIGHT BREAST LIMITED
TECHNIQUE: Bilateral digital diagnostic mammography and breast tomosynthesis
was performed. The images were evaluated with computer-aided
detection.; Targeted ultrasound examination of the right breast was
performed

[R CC]
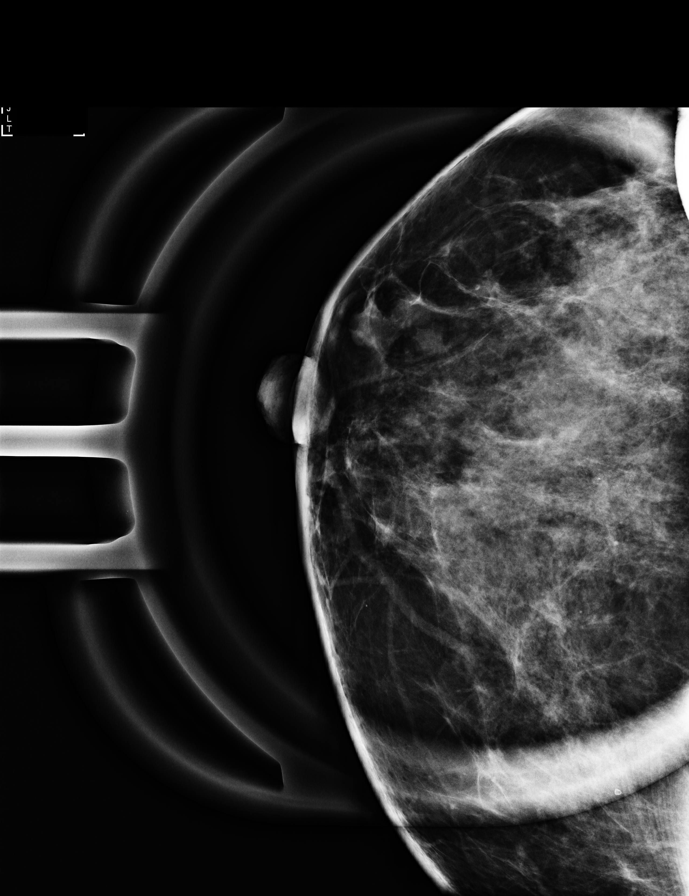

[L ML]
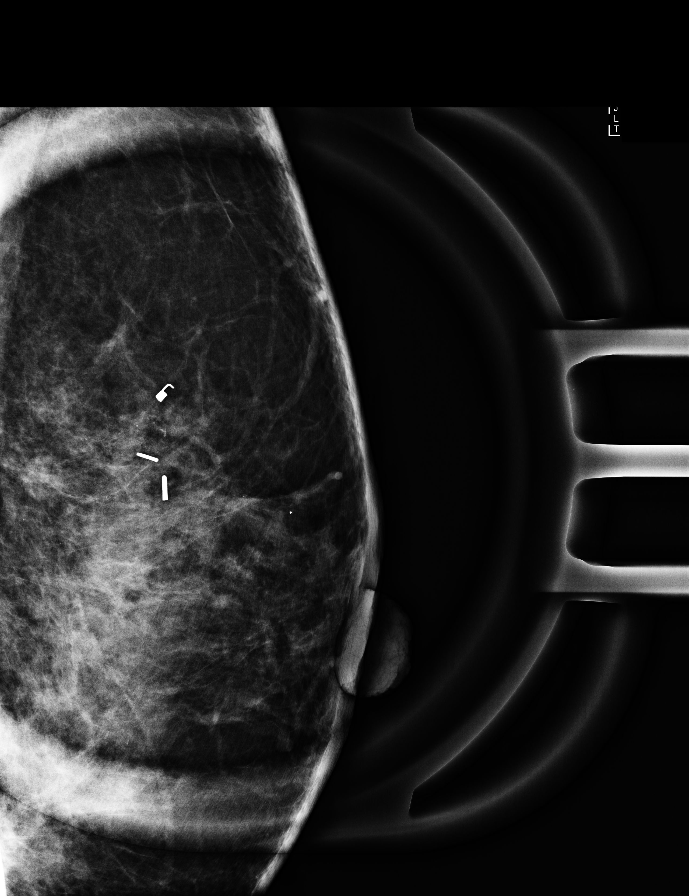

[L CC]
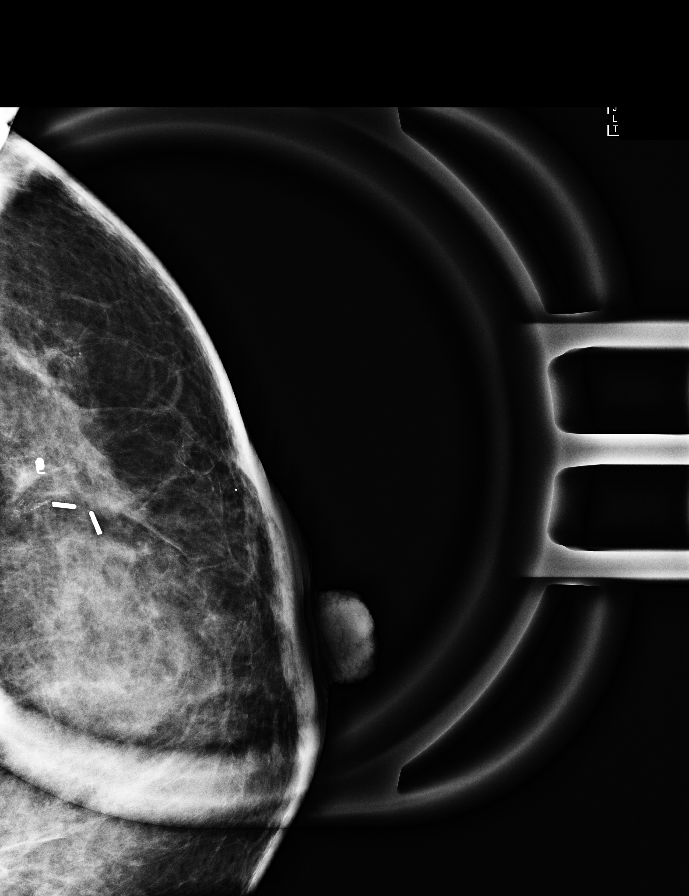

[R ML]
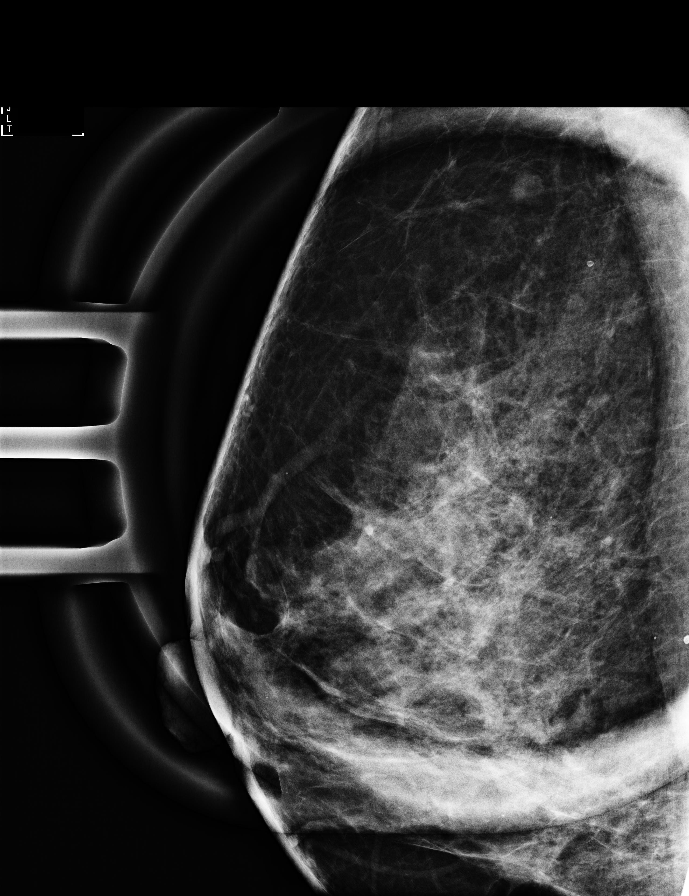

[R CC synth-2D]
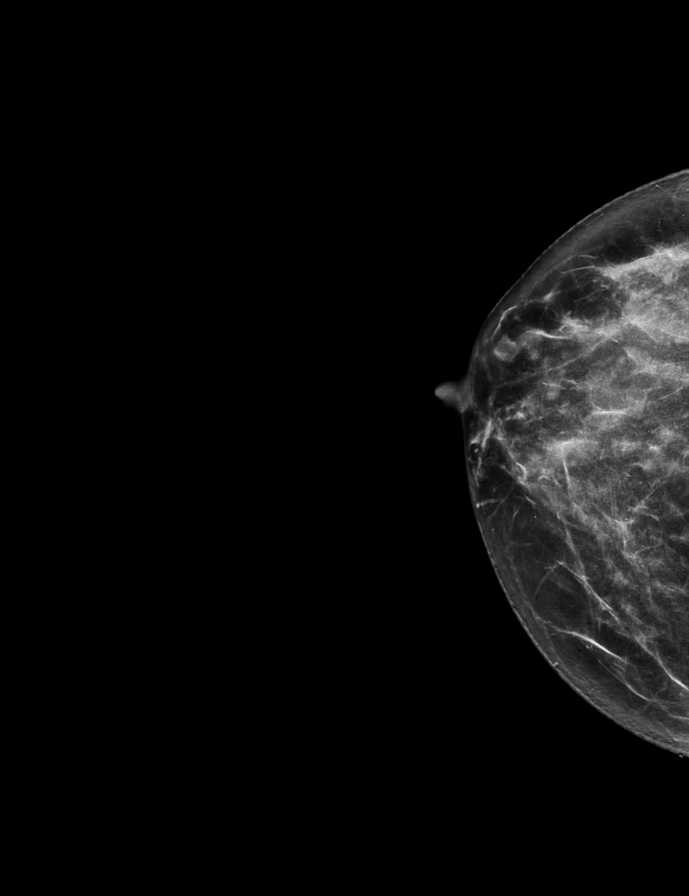

[R MLO synth-2D]
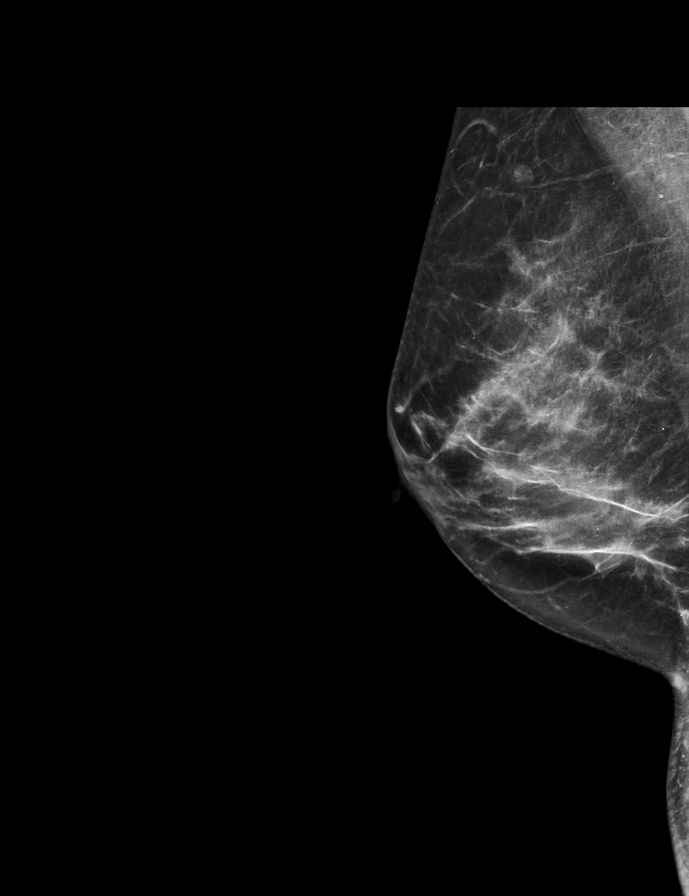

[L MLO synth-2D]
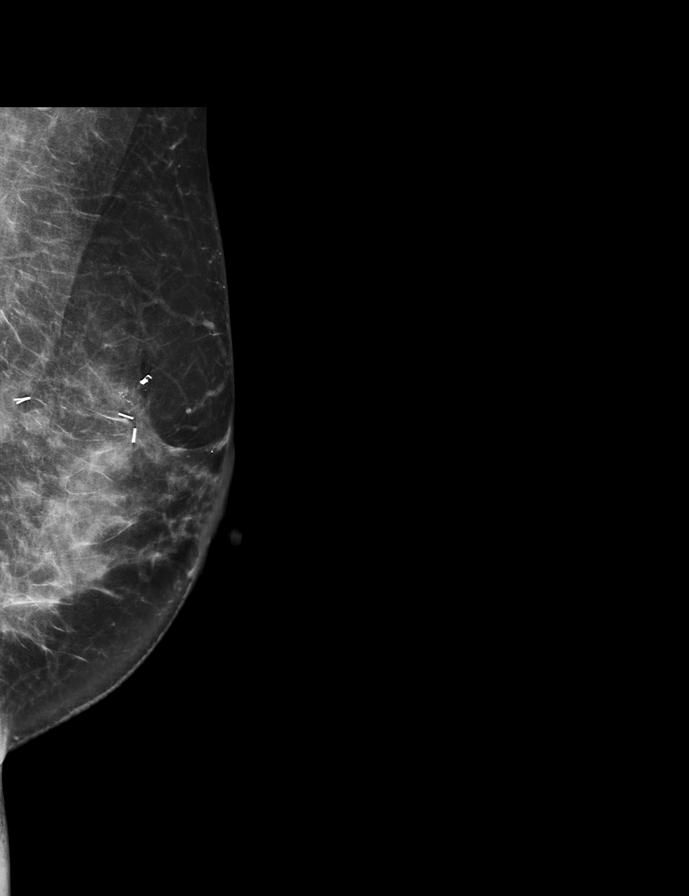

[L CC synth-2D]
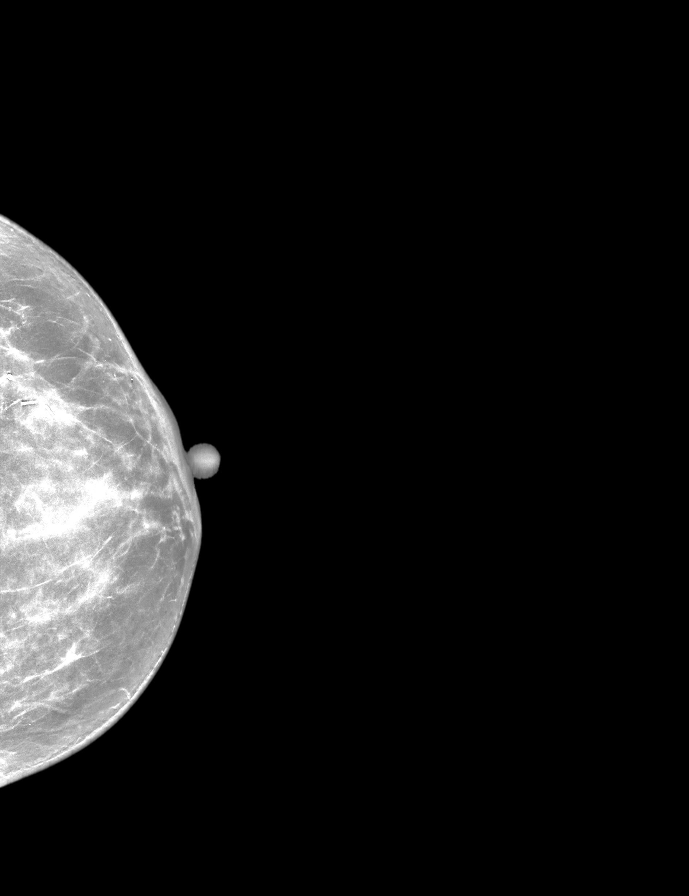

[8 of 28 positions shown; findings below may reference images not displayed]

ACR Breast Density Category c: The breast tissue is heterogeneously
dense, which may obscure small masses.
FINDINGS: In the left breast, post lumpectomy changes, as well as coil and
ribbon shaped post biopsy marker clips around the lumpectomy bed,
are stable as are associated benign lumpectomy bed calcifications.
There are no left breast masses, areas of nonsurgical architectural
distortion or new or suspicious calcifications.

In the right breast, the subtle group of calcifications in the
posterior, upper right breast are stable. There are no new
calcifications. There is a possible mass in the anterior, upper
outer right breast near the nipple. No other defined masses and no
areas of architectural distortion.

Targeted right breast ultrasound is performed, showing 2 adjacent
cysts with thin internal septations, but no significant complicating
features, measuring 8 and 6 mm in size, located at 9 o'clock,
retroareolar, consistent with the adjacent masses noted
mammographically. There are no solid masses or suspicious lesions.
IMPRESSION: 1. Probably benign right breast calcifications, stable for 1 year.
2. Benign cysts in the 9 o'clock retroareolar right breast.
3. Benign post lumpectomy changes on the left.

RECOMMENDATION:
1. Diagnostic mammography in 1 year with right breast magnification
views. This would document 2 years of stability for the probably
benign right breast calcifications.

I have discussed the findings and recommendations with the patient.
If applicable, a reminder letter will be sent to the patient
regarding the next appointment.

BI-RADS CATEGORY  3: Probably benign.

## 2022-03-26 ENCOUNTER — Ambulatory Visit (INDEPENDENT_AMBULATORY_CARE_PROVIDER_SITE_OTHER): Payer: No Typology Code available for payment source | Admitting: Family Medicine

## 2022-03-26 ENCOUNTER — Encounter: Payer: Self-pay | Admitting: Family Medicine

## 2022-03-26 VITALS — BP 127/69 | HR 62 | Temp 97.7°F | Ht 63.0 in | Wt 136.0 lb

## 2022-03-26 DIAGNOSIS — I10 Essential (primary) hypertension: Secondary | ICD-10-CM

## 2022-03-26 DIAGNOSIS — Z1211 Encounter for screening for malignant neoplasm of colon: Secondary | ICD-10-CM | POA: Diagnosis not present

## 2022-03-26 DIAGNOSIS — M5412 Radiculopathy, cervical region: Secondary | ICD-10-CM

## 2022-03-26 NOTE — Assessment & Plan Note (Signed)
Exam stable today.  Her sensations of heaviness in left upper extremity likely due to her cervical radiculopathy.  She will continue management per sports medicine and PM&R.

## 2022-03-26 NOTE — Progress Notes (Signed)
   Victoria Maddox is a 46 y.o. female who presents today for an office visit.  Assessment/Plan:  Chronic Problems Addressed Today: Cervical radiculopathy Exam stable today.  Her sensations of heaviness in left upper extremity likely due to her cervical radiculopathy.  She will continue management per sports medicine and PM&R.   HTN (hypertension) Doing much better with verapamil 360 mg daily.  Does not need refill today.  She will continue home monitoring.  We can follow-up again in 6 to 12 months.     Subjective:  HPI:  See A/p for status of chronic conditions.    We saw her a few months ago.  Had high blood pressure and we increased verapamil.  She has done well with this.  She still has sensation of ongoing heaviness and numbness in her left upper extremity.  This has been going on for several months.  She has been following with sports medicine and PMNR for cervical radiculopathy.  Symptoms are overall stable.  No specific treatments tried.  She has done physical therapy in the past which was not helpful.        Objective:  Physical Exam: BP 127/69   Pulse 62   Temp 97.7 F (36.5 C) (Temporal)   Ht '5\' 3"'$  (1.6 m)   Wt 136 lb (61.7 kg)   SpO2 100%   BMI 24.09 kg/m   Gen: No acute distress, resting comfortably CV: Regular rate and rhythm with no murmurs appreciated Pulm: Normal work of breathing, clear to auscultation bilaterally with no crackles, wheezes, or rhonchi MSK: - Neck:  - Left Arm: No deformities.  Sensation light touch intact throughout.  Strength intact throughout.  Tinel's sign negative at wrist and medial malleoli.  Neurovascular intact distally.  Distal pulses 2+ and symmetric bilaterally. Neuro: Grossly normal, moves all extremities Psych: Normal affect and thought content      Toma Erichsen M. Jerline Pain, MD 03/26/2022 9:23 AM

## 2022-03-26 NOTE — Assessment & Plan Note (Signed)
Doing much better with verapamil 360 mg daily.  Does not need refill today.  She will continue home monitoring.  We can follow-up again in 6 to 12 months.

## 2022-03-26 NOTE — Patient Instructions (Signed)
It was very nice to see you today!  Your blood pressure looks much better today.  I think the symptoms in your arm are coming from your neck.  Please follow-up with Dr. Ernestina Patches soon to discuss.  We will see back in 6 to 12 months for your annual physical.  Please come back sooner if needed.  Take care, Dr Jerline Pain  PLEASE NOTE:  If you had any lab tests please let us know if you have not heard back within a few days. You may see your results on mychart before we have a chance to review them but we will give you a call once they are reviewed by [place]. If we ordered any referrals today, please let us know if you have not heard from their office within the next week.   Please try these tips to maintain a healthy lifestyle:  Eat at least 3 REAL meals and 1-2 snacks per day.  Aim for no more than 5 hours between eating.  If you eat breakfast, please do so within one hour of getting up.   Each meal should contain half fruits/vegetables, one quarter protein, and one quarter carbs (no bigger than a computer mouse)  Cut down on sweet beverages. This includes juice, soda, and sweet tea.   Drink at least 1 glass of water with each meal and aim for at least 8 glasses per day  Exercise at least 150 minutes every week.

## 2022-04-10 ENCOUNTER — Ambulatory Visit: Payer: No Typology Code available for payment source | Admitting: Hematology and Oncology

## 2022-04-27 ENCOUNTER — Encounter: Payer: Self-pay | Admitting: Family Medicine

## 2022-05-06 NOTE — Assessment & Plan Note (Deleted)
02/08/2020:Screening mammogram detected left breast calcifications, 2.9cm. 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 Adjuvant radiation therapy completed 06/06/2020  Current treatment:antiestrogen therapy with tamoxifen 5 years started July 2021 Tamoxifen toxicities: 1.Hot flashes and mood swings:Currently on Effexor.   Chronic dry cough: Uncertain etiology.  She is taking antacid medications.  I have looked at all of her medications and could not find anything that increases cough risk..  Insomnia: I discussed with her extensively about all the things that she needs to do to help sleep better.  CT chest 02/04/22: Stable tiny RUL lung nodules favored benign  Breast cancer surveillance: 1.Breast exam 05/06/22: Benign 2.Mammogram5/15/23: Benign Calcifications stable for 1 year  Return to clinic in 1 year for follow-up

## 2022-05-07 ENCOUNTER — Inpatient Hospital Stay
Payer: No Typology Code available for payment source | Attending: Hematology and Oncology | Admitting: Hematology and Oncology

## 2022-05-07 DIAGNOSIS — D0512 Intraductal carcinoma in situ of left breast: Secondary | ICD-10-CM

## 2022-05-12 ENCOUNTER — Encounter (HOSPITAL_COMMUNITY): Payer: Self-pay

## 2022-05-12 ENCOUNTER — Emergency Department (HOSPITAL_COMMUNITY)
Admission: EM | Admit: 2022-05-12 | Discharge: 2022-05-12 | Discharge status: Against Medical Advice | Payer: No Typology Code available for payment source | Attending: Emergency Medicine | Admitting: Emergency Medicine

## 2022-05-12 ENCOUNTER — Other Ambulatory Visit: Payer: Self-pay

## 2022-05-12 DIAGNOSIS — R197 Diarrhea, unspecified: Secondary | ICD-10-CM | POA: Diagnosis not present

## 2022-05-12 DIAGNOSIS — Z5321 Procedure and treatment not carried out due to patient leaving prior to being seen by health care provider: Secondary | ICD-10-CM | POA: Insufficient documentation

## 2022-05-12 DIAGNOSIS — R112 Nausea with vomiting, unspecified: Secondary | ICD-10-CM | POA: Diagnosis present

## 2022-05-12 DIAGNOSIS — R109 Unspecified abdominal pain: Secondary | ICD-10-CM | POA: Diagnosis not present

## 2022-05-12 LAB — CBC WITH DIFFERENTIAL/PLATELET
Abs Immature Granulocytes: 0.01 10*3/uL (ref 0.00–0.07)
Basophils Absolute: 0 10*3/uL (ref 0.0–0.1)
Basophils Relative: 0 %
Eosinophils Absolute: 0.1 10*3/uL (ref 0.0–0.5)
Eosinophils Relative: 2 %
HCT: 36.3 % (ref 36.0–46.0)
Hemoglobin: 11.7 g/dL — ABNORMAL LOW (ref 12.0–15.0)
Immature Granulocytes: 0 %
Lymphocytes Relative: 41 %
Lymphs Abs: 2.1 10*3/uL (ref 0.7–4.0)
MCH: 26.6 pg (ref 26.0–34.0)
MCHC: 32.2 g/dL (ref 30.0–36.0)
MCV: 82.5 fL (ref 80.0–100.0)
Monocytes Absolute: 0.6 10*3/uL (ref 0.1–1.0)
Monocytes Relative: 13 %
Neutro Abs: 2.3 10*3/uL (ref 1.7–7.7)
Neutrophils Relative %: 44 %
Platelets: 284 10*3/uL (ref 150–400)
RBC: 4.4 MIL/uL (ref 3.87–5.11)
RDW: 14.3 % (ref 11.5–15.5)
WBC: 5.1 10*3/uL (ref 4.0–10.5)
nRBC: 0 % (ref 0.0–0.2)

## 2022-05-12 LAB — COMPREHENSIVE METABOLIC PANEL
ALT: 19 U/L (ref 0–44)
AST: 20 U/L (ref 15–41)
Albumin: 3.8 g/dL (ref 3.5–5.0)
Alkaline Phosphatase: 76 U/L (ref 38–126)
Anion gap: 7 (ref 5–15)
BUN: 14 mg/dL (ref 6–20)
CO2: 28 mmol/L (ref 22–32)
Calcium: 9.2 mg/dL (ref 8.9–10.3)
Chloride: 107 mmol/L (ref 98–111)
Creatinine, Ser: 0.68 mg/dL (ref 0.44–1.00)
GFR, Estimated: >60 mL/min (ref >60)
Glucose, Bld: 118 mg/dL — ABNORMAL HIGH (ref 70–99)
Potassium: 3.1 mmol/L — ABNORMAL LOW (ref 3.5–5.1)
Sodium: 142 mmol/L (ref 135–145)
Total Bilirubin: 0.4 mg/dL (ref 0.3–1.2)
Total Protein: 7.5 g/dL (ref 6.5–8.1)

## 2022-05-12 LAB — I-STAT BETA HCG BLOOD, ED (MC, WL, AP ONLY): I-stat hCG, quantitative: <5 m[IU]/mL (ref <5)

## 2022-05-12 LAB — LIPASE, BLOOD: Lipase: 33 U/L (ref 11–51)

## 2022-05-12 NOTE — ED Triage Notes (Signed)
Pt reports with nausea, vomiting, diarrhea, and abdominal pain that started suddenly after eating frozen noodles.

## 2022-05-12 NOTE — ED Notes (Signed)
Pt states that she just wants to go home.

## 2022-08-03 ENCOUNTER — Encounter: Payer: Self-pay | Admitting: *Deleted

## 2022-08-14 ENCOUNTER — Other Ambulatory Visit: Payer: Self-pay | Admitting: Hematology and Oncology

## 2022-08-14 ENCOUNTER — Other Ambulatory Visit (HOSPITAL_COMMUNITY): Payer: Self-pay

## 2022-08-14 NOTE — Telephone Encounter (Signed)
Pt needs appointment

## 2022-08-17 ENCOUNTER — Other Ambulatory Visit (HOSPITAL_COMMUNITY): Payer: Self-pay

## 2022-08-18 ENCOUNTER — Other Ambulatory Visit (HOSPITAL_BASED_OUTPATIENT_CLINIC_OR_DEPARTMENT_OTHER): Payer: Self-pay

## 2022-08-18 MED ORDER — FLUARIX QUADRIVALENT 0.5 ML IM SUSY
PREFILLED_SYRINGE | INTRAMUSCULAR | 0 refills | Status: DC
  Filled 2022-08-18: qty 0.5, 1d supply, fill #0

## 2022-08-20 ENCOUNTER — Other Ambulatory Visit (HOSPITAL_COMMUNITY): Payer: Self-pay

## 2022-08-21 ENCOUNTER — Other Ambulatory Visit (HOSPITAL_COMMUNITY): Payer: Self-pay

## 2022-08-24 ENCOUNTER — Other Ambulatory Visit (HOSPITAL_COMMUNITY): Payer: Self-pay

## 2022-08-24 ENCOUNTER — Other Ambulatory Visit: Payer: Self-pay | Admitting: *Deleted

## 2022-08-24 MED ORDER — TAMOXIFEN CITRATE 20 MG PO TABS
20.0000 mg | ORAL_TABLET | Freq: Every day | ORAL | 3 refills | Status: DC
  Filled 2022-08-24: qty 30, 30d supply, fill #0
  Filled 2022-10-14: qty 30, 30d supply, fill #1
  Filled 2022-12-16: qty 30, 30d supply, fill #2
  Filled 2023-02-10: qty 30, 30d supply, fill #3

## 2022-08-28 ENCOUNTER — Other Ambulatory Visit (HOSPITAL_COMMUNITY): Payer: Self-pay

## 2022-09-04 ENCOUNTER — Other Ambulatory Visit (HOSPITAL_COMMUNITY): Payer: Self-pay

## 2022-10-14 ENCOUNTER — Other Ambulatory Visit (HOSPITAL_COMMUNITY): Payer: Self-pay

## 2022-12-17 ENCOUNTER — Other Ambulatory Visit (HOSPITAL_COMMUNITY): Payer: Self-pay

## 2023-02-04 ENCOUNTER — Other Ambulatory Visit (HOSPITAL_COMMUNITY): Payer: Self-pay

## 2023-02-04 ENCOUNTER — Ambulatory Visit (INDEPENDENT_AMBULATORY_CARE_PROVIDER_SITE_OTHER): Payer: 59 | Admitting: Family

## 2023-02-04 VITALS — BP 160/84 | HR 65 | Temp 97.8°F | Ht 63.0 in | Wt 137.2 lb

## 2023-02-04 DIAGNOSIS — G43909 Migraine, unspecified, not intractable, without status migrainosus: Secondary | ICD-10-CM

## 2023-02-04 MED ORDER — TIZANIDINE HCL 2 MG PO TABS
2.0000 mg | ORAL_TABLET | Freq: Three times a day (TID) | ORAL | 1 refills | Status: AC | PRN
  Filled 2023-02-04: qty 60, 10d supply, fill #0
  Filled 2024-01-26: qty 60, 10d supply, fill #1

## 2023-02-04 MED ORDER — KETOROLAC TROMETHAMINE 60 MG/2ML IM SOLN
60.0000 mg | Freq: Once | INTRAMUSCULAR | Status: AC
  Administered 2023-02-04: 60 mg via INTRAMUSCULAR

## 2023-02-04 MED ORDER — ONDANSETRON 4 MG PO TBDP
4.0000 mg | ORAL_TABLET | Freq: Three times a day (TID) | ORAL | 0 refills | Status: DC | PRN
  Filled 2023-02-04: qty 20, 7d supply, fill #0

## 2023-02-04 NOTE — Progress Notes (Signed)
Patient ID: Victoria Maddox, female    DOB: Mar 23, 1976, 47 y.o.   MRN: [id number]  Chief Complaint  Patient presents with   Migraine    Pt c/o migraines for 10 days, Has tried goodys, ibuprofen and excedrin which does help but states she may be taking too much.     HPI:      Migraines:  pt has been taking Verapamil 360mg  qd as of last May, this also helps her BP. She reports this had been working to prevent her migraines, but she reports she is fasting for Ramadan and can only eat or drink during the night. She has been  taking OTC meds that help but then it just comes right back. She is visibly in pain, lying back with eyes closed, speaking very softly. She also reports having nausea.  Assessment & Plan:  1. Migraine without status migrainosus, not intractable, unspecified migraine type - given Toradol injection in office, sending Zofran, and refilling Tizanidine, advised on use & SE of meds. Advised she will have continued pain if she does not hydrate as this is a big trigger for migraines.  - ondansetron (ZOFRAN-ODT) 4 MG disintegrating tablet; Take 1 tablet (4 mg total) by mouth every 8 (eight) hours as needed for nausea or vomiting.  Dispense: 20 tablet; Refill: 0 - tiZANidine (ZANAFLEX) 2 MG tablet; Take 1-2 tablets (2-4 mg total) by mouth every 8 (eight) hours as needed for muscle spasms (Or for Migraine pain.).  Dispense: 60 tablet; Refill: 1 - ketorolac (TORADOL) injection 60 mg   Subjective:    Outpatient Medications Prior to Visit  Medication Sig Dispense Refill   diphenhydrAMINE (BENADRYL) 25 mg capsule Take 25 mg by mouth at bedtime as needed.     influenza vac split quadrivalent PF (FLUARIX QUADRIVALENT) 0.5 ML injection Inject into the muscle. 0.5 mL 0   pantoprazole (PROTONIX) 40 MG tablet Take 1 tablet (40 mg total) by mouth daily. 30 tablet 6   tamoxifen (NOLVADEX) 20 MG tablet Take 1 tablet (20 mg total) by mouth daily. 30 tablet 3   tiZANidine (ZANAFLEX) 2 MG tablet  Take 1-2 tablets by mouth every 8 hours as needed for muscle spasms. 60 tablet 1   venlafaxine XR (EFFEXOR-XR) 75 MG 24 hr capsule Take 1 capsule (75 mg total) by mouth daily with breakfast. 90 capsule 3   verapamil (CALAN-SR) 180 MG CR tablet Take 2 tablets (360 mg total) by mouth every morning. 180 tablet 3   No facility-administered medications prior to visit.   Past Medical History:  Diagnosis Date   Allergy    Depression    Family history of breast cancer    Family history of kidney cancer    Family history of throat cancer    Frequent headaches    Heart murmur    History of recurrent UTIs    Hypertension    Migraines    Personal history of radiation therapy    Urinary incontinence    Past Surgical History:  Procedure Laterality Date   BREAST BIOPSY Left 01/29/2020   BREAST BIOPSY Left 02/06/2020   BREAST LUMPECTOMY Left 03/12/2020   BREAST LUMPECTOMY WITH RADIOACTIVE SEED LOCALIZATION Left 03/12/2020   Procedure: LEFT BREAST LUMPECTOMY WITH BRACKETED RADIOACTIVE SEED LOCALIZATION;  Surgeon: Rolm Bookbinder, MD;  Location: [facility];  Service: General;  Laterality: Left;   DILATION AND CURETTAGE OF UTERUS     Allergies  Allergen Reactions   Trazodone And Nefazodone  Dizziness   Zoloft [Sertraline Hcl]     Anxiousness, lash out at kids      Objective:    Physical Exam Vitals and nursing note reviewed.  Constitutional:      General: She is in acute distress (d/t pain).     Appearance: Normal appearance. She is ill-appearing.  Cardiovascular:     Rate and Rhythm: Normal rate and regular rhythm.  Pulmonary:     Effort: Pulmonary effort is normal.     Breath sounds: Normal breath sounds.  Musculoskeletal:        General: Normal range of motion.  Skin:    General: Skin is warm and dry.  Neurological:     Mental Status: She is alert.  Psychiatric:        Mood and Affect: Mood normal.        Behavior: Behavior normal.    BP (!) 160/84 (BP  Location: Left Arm, Patient Position: Sitting, Cuff Size: Normal)   Pulse 65   Temp 97.8 F (36.6 C) (Temporal)   Ht 5\' 3"  (1.6 m)   Wt 137 lb 4 oz (62.3 kg)   SpO2 100%   BMI 24.31 kg/m  Wt Readings from Last 3 Encounters:  02/04/23 137 lb 4 oz (62.3 kg)  05/12/22 130 lb (59 kg)  03/26/22 136 lb (61.7 kg)      Jeanie Sewer, NP

## 2023-02-10 ENCOUNTER — Other Ambulatory Visit (HOSPITAL_COMMUNITY): Payer: Self-pay

## 2023-02-23 ENCOUNTER — Telehealth (INDEPENDENT_AMBULATORY_CARE_PROVIDER_SITE_OTHER): Payer: 59 | Admitting: Family Medicine

## 2023-02-23 ENCOUNTER — Other Ambulatory Visit (HOSPITAL_COMMUNITY): Payer: Self-pay

## 2023-02-23 ENCOUNTER — Encounter: Payer: Self-pay | Admitting: Family Medicine

## 2023-02-23 VITALS — Ht 63.0 in | Wt 137.0 lb

## 2023-02-23 DIAGNOSIS — R059 Cough, unspecified: Secondary | ICD-10-CM

## 2023-02-23 MED ORDER — AZITHROMYCIN 250 MG PO TABS
ORAL_TABLET | ORAL | 0 refills | Status: DC
  Filled 2023-02-23: qty 6, 5d supply, fill #0

## 2023-02-23 MED ORDER — AZELASTINE HCL 0.1 % NA SOLN
2.0000 | Freq: Two times a day (BID) | NASAL | 12 refills | Status: DC
  Filled 2023-02-23: qty 30, 25d supply, fill #0

## 2023-02-23 MED ORDER — PROMETHAZINE-DM 6.25-15 MG/5ML PO SYRP
5.0000 mL | ORAL_SOLUTION | Freq: Four times a day (QID) | ORAL | 0 refills | Status: DC | PRN
  Filled 2023-02-23: qty 118, 6d supply, fill #0

## 2023-02-23 NOTE — Progress Notes (Signed)
   Victoria Maddox is a 47 y.o. female who presents today for a virtual office visit.  Assessment/Plan:  Cough Discussed limitations of virtual visit and inability perform physical exam.  Likely has viral upper respiratory infection.  No red flag signs or symptoms.  Will start Astelin nasal spray.  Will send in promethazine-dextromethorphan cough syrup.  Discussed side effects.  Will send pocket prescription for azithromycin with instruction to not start unless symptoms fail to improve over the next few days.  Encouraged hydration.  She can use over-the-counter meds as needed.  We discussed reasons to return to care.  Follow-up as needed.    Subjective:  HPI:  Patient here with cough and congestion. This started a few days ago. Lots of sore throat.  No known sick contacts. Tried several OTC medications without much improvement. No fevers or chills. No nausea or vomiting. No diarrhea. No chest pain or shortness of breath.        Objective/Observations  Physical Exam: Gen: NAD, resting comfortably Pulm: Normal work of breathing Neuro: Grossly normal, moves all extremities Psych: Normal affect and thought content  Virtual Visit via Video   I connected with Larey Days on 02/23/23 at 11:40 AM EDT by a video enabled telemedicine application and verified that I am speaking with the correct person using two identifiers. The limitations of evaluation and management by telemedicine and the availability of in person appointments were discussed. The patient expressed understanding and agreed to proceed.   Patient location: Home Provider location: [place] [facility] [facility] Persons participating in the virtual visit: Myself and Patient     Katina Degree. Jimmey Ralph, MD 02/23/2023 12:29 PM

## 2023-04-02 ENCOUNTER — Other Ambulatory Visit: Payer: Self-pay | Admitting: Hematology and Oncology

## 2023-04-02 ENCOUNTER — Other Ambulatory Visit (HOSPITAL_COMMUNITY): Payer: Self-pay

## 2023-04-02 MED ORDER — TAMOXIFEN CITRATE 20 MG PO TABS
20.0000 mg | ORAL_TABLET | Freq: Every day | ORAL | 3 refills | Status: DC
  Filled 2023-04-02: qty 30, 30d supply, fill #0

## 2023-04-14 NOTE — Progress Notes (Signed)
Patient Care Team: Ardith Dark, MD as PCP - General (Family Medicine) Emelia Loron, MD as Consulting Physician (General Surgery) Serena Croissant, MD as Consulting Physician (Hematology and Oncology) Dorothy Puffer, MD as Consulting Physician (Radiation Oncology) Shea Evans, MD as Consulting Physician (Obstetrics and Gynecology) Ardith Dark, MD as Consulting Physician (Family Medicine)  DIAGNOSIS:  Encounter Diagnosis  Name Primary?   Ductal carcinoma in situ (DCIS) of left breast Yes    SUMMARY OF ONCOLOGIC HISTORY: Oncology History  Ductal carcinoma in situ (DCIS) of left breast  02/08/2020 Initial Diagnosis   Screening mammogram detected left breast calcifications, 2.9cm. Biopsy showed DCIS with calcifications, low grade, ER+ 70%, PR+ 90%.   02/20/2020 Genetic Testing   Negative genetic testing:  No pathogenic variants detected on the [facility] Breast Cancer STAT or Multi-Cancer Panels. The report date is 02/20/2020.  The Breast Cancer STAT Panel offered by [facility] includes sequencing and deletion/duplication analysis for the following 9 genes:  ATM, BRCA1, BRCA2, CDH1, CHEK2, PALB2, PTEN, STK11 and TP53.  The Multi-Cancer Panel offered by [facility] includes sequencing and/or deletion duplication testing of the following 85 genes: AIP, ALK, APC, ATM, AXIN2,BAP1,  BARD1, BLM, BMPR1A, BRCA1, BRCA2, BRIP1, CASR, CDC73, CDH1, CDK4, CDKN1B, CDKN1C, CDKN2A (p14ARF), CDKN2A (p16INK4a), CEBPA, CHEK2, CTNNA1, DICER1, DIS3L2, EGFR (c.2369C>T, p.Thr790Met variant only), EPCAM (Deletion/duplication testing only), FH, FLCN, GATA2, GPC3, GREM1 (Promoter region deletion/duplication testing only), HOXB13 (c.251G>A, p.Gly84Glu), HRAS, KIT, MAX, MEN1, MET, MITF (c.952G>A, p.Glu318Lys variant only), MLH1, MSH2, MSH3, MSH6, MUTYH, NBN, NF1, NF2, NTHL1, PALB2, PDGFRA, PHOX2B, PMS2, POLD1, POLE, POT1, PRKAR1A, PTCH1, PTEN, RAD50, RAD51C, RAD51D, RB1, RECQL4, RET, RNF43, RUNX1, SDHAF2, SDHA (sequence  changes only), SDHB, SDHC, SDHD, SMAD4, SMARCA4, SMARCB1, SMARCE1, STK11, SUFU, TERC, TERT, TMEM127, TP53, TSC1, TSC2, VHL, WRN and WT1.   03/12/2020 Surgery   Left lumpectomy [name]) [phone]): flat epithelial atypia with calcifications. Negative margins. No regional lymph nodes were examined. ER/PR +.   04/23/2020 - 06/07/2020 Radiation Therapy   The patient initially received a dose of 50.4 Gy in 28 fractions to the breast using whole-breast tangent fields. This was delivered using a 3-D conformal technique. The pt received a boost delivering an additional 10 Gy in 5 fractions using a electron boost with electrons. The total dose was 60.4 Gy.   06/2020 - 06/2025 Anti-estrogen oral therapy   Tamoxifen     CHIEF COMPLIANT: Follow-up of left breast DCIS on tamoxifen   INTERVAL HISTORY: Victoria Maddox is a 47 y.o. with above-mentioned history of left breast DCIS who underwent a left lumpectomy, radiation, and is currently on antiestrogen therapy with tamoxifen. She presents to the clinic today for follow-up. She reports that she has hot flashes    ALLERGIES:  is allergic to trazodone and nefazodone and zoloft [sertraline hcl].  MEDICATIONS:  Current Outpatient Medications  Medication Sig Dispense Refill   diphenhydrAMINE (BENADRYL) 25 mg capsule Take 25 mg by mouth at bedtime as needed.     hydrochlorothiazide (HYDRODIURIL) 25 MG tablet Take by mouth.     tamoxifen (NOLVADEX) 20 MG tablet Take 1 tablet (20 mg) by mouth daily. 90 tablet 3   tiZANidine (ZANAFLEX) 2 MG tablet Take 1-2 tablets (2-4 mg total) by mouth every 8 (eight) hours as needed for muscle spasms (Or for Migraine pain.). 60 tablet 1   verapamil (CALAN-SR) 180 MG CR tablet Take 2 tablets (360 mg total) by mouth every morning. 180 tablet 3   No current facility-administered medications for this visit.  PHYSICAL EXAMINATION: ECOG PERFORMANCE STATUS: 1 - Symptomatic but completely ambulatory  Vitals:    04/27/23 1443  BP: (!) 142/65  Pulse: 80  Resp: 18  Temp: (!) 97.5 F (36.4 C)  SpO2: 100%   Filed Weights   04/27/23 1443  Weight: 139 lb 12.8 oz (63.4 kg)      LABORATORY DATA:  I have reviewed the data as listed    Latest Ref Rng & Units 05/12/2022    4:35 AM 01/27/2022    9:05 AM 07/30/2021    9:51 AM  CMP  Glucose 70 - 99 mg/dL 409  99  811   BUN 6 - 20 mg/dL 14  17  12    Creatinine 0.44 - 1.00 mg/dL 9.14  7.82  9.56   Sodium 135 - 145 mmol/L 142  139  143   Potassium 3.5 - 5.1 mmol/L 3.1  3.9  3.8   Chloride 98 - 111 mmol/L 107  103  104   CO2 22 - 32 mmol/L 28  32  29   Calcium 8.9 - 10.3 mg/dL 9.2  9.6  9.4   Total Protein 6.5 - 8.1 g/dL 7.5  7.5  7.4   Total Bilirubin 0.3 - 1.2 mg/dL 0.4  0.3  0.3   Alkaline Phos 38 - 126 U/L 76  72  66   AST 15 - 41 U/L 20  14  13    ALT 0 - 44 U/L 19  13  9      Lab Results  Component Value Date   WBC 5.1 05/12/2022   HGB 11.7 (L) 05/12/2022   HCT 36.3 05/12/2022   MCV 82.5 05/12/2022   PLT 284 05/12/2022   NEUTROABS 2.3 05/12/2022    ASSESSMENT & PLAN:  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 Biopsy showed DCIS with calcifications, low grade, ER+ 70%, PR+ 90%.  Additional biopsy: Flat epithelial atypia which needs excision Tis NX stage 0   03/12/2020: Left lumpectomy: Flat epithelial atypia radial scar uninvolved by DCIS. Adjuvant radiation therapy completed 06/06/2020   Current treatment: antiestrogen therapy with tamoxifen 5 years started July 2021  Tamoxifen toxicities: 1.  Hot flashes and mood swings: Currently on Effexor.    CT chest March 2022: 3 mm lung nodule: I discussed with her that it appears to be benign  CT chest 02/04/2022: Stable tiny right upper lobe lung nodules favored benign   Breast cancer surveillance: Mammogram 03/23/2022: Probably benign right breast calcifications stable, benign cyst right breast   She is doing  a masters degree in biology from [employer].  She hopes to graduate after next semester. Return to clinic in 1 year for follow-up      Orders Placed This Encounter  Procedures   MM DIAG BREAST TOMO BILATERAL    Standing Status:   Future    Standing Expiration Date:   04/26/2024    Order Specific Question:   Reason for Exam (SYMPTOM  OR DIAGNOSIS REQUIRED)    Answer:   surveillance    Order Specific Question:   Is the patient pregnant?    Answer:   No    Order Specific Question:   Preferred imaging location?    Answer:   [facility]   The patient has a good understanding of the overall plan. she agrees with it. she will call with any problems that may develop before the next visit here. Total time spent: 30 mins including face  to face time and time spent for planning, charting and co-ordination of care   Tamsen Meek, MD 04/27/23    I Janan Ridge am acting as a Neurosurgeon for Dr.Aisa Schoeppner  I have reviewed the above documentation for accuracy and completeness, and I agree with the above.

## 2023-04-27 ENCOUNTER — Inpatient Hospital Stay: Payer: 59 | Attending: Hematology and Oncology | Admitting: Hematology and Oncology

## 2023-04-27 ENCOUNTER — Other Ambulatory Visit (HOSPITAL_COMMUNITY): Payer: Self-pay

## 2023-04-27 VITALS — BP 142/65 | HR 80 | Temp 97.5°F | Resp 18 | Ht 63.0 in | Wt 139.8 lb

## 2023-04-27 DIAGNOSIS — Z79810 Long term (current) use of selective estrogen receptor modulators (SERMs): Secondary | ICD-10-CM | POA: Insufficient documentation

## 2023-04-27 DIAGNOSIS — Z923 Personal history of irradiation: Secondary | ICD-10-CM | POA: Diagnosis not present

## 2023-04-27 DIAGNOSIS — D0512 Intraductal carcinoma in situ of left breast: Secondary | ICD-10-CM | POA: Diagnosis not present

## 2023-04-27 DIAGNOSIS — Z79899 Other long term (current) drug therapy: Secondary | ICD-10-CM | POA: Diagnosis not present

## 2023-04-27 MED ORDER — TAMOXIFEN CITRATE 20 MG PO TABS
20.0000 mg | ORAL_TABLET | Freq: Every day | ORAL | 3 refills | Status: DC
  Filled 2023-04-27 – 2023-05-21 (×2): qty 90, 90d supply, fill #0
  Filled 2023-08-11: qty 90, 90d supply, fill #1
  Filled 2024-01-26: qty 90, 90d supply, fill #2
  Filled 2024-04-26: qty 90, 90d supply, fill #3

## 2023-04-27 NOTE — Assessment & Plan Note (Addendum)
02/08/2020: Screening mammogram detected left breast calcifications, 2.9cm. Biopsy showed DCIS with calcifications, low grade, ER+ 70%, PR+ 90%.  Additional biopsy: Flat epithelial atypia which needs excision Tis NX stage 0   03/12/2020: Left lumpectomy: Flat epithelial atypia radial scar uninvolved by DCIS. Adjuvant radiation therapy completed 06/06/2020   Current treatment: antiestrogen therapy with tamoxifen 5 years started July 2021  Tamoxifen toxicities: 1.  Hot flashes and mood swings: Currently on Effexor.    CT chest March 2022: 3 mm lung nodule: I discussed with her that it appears to be benign  CT chest 02/04/2022: Stable tiny right upper lobe lung nodules favored benign   Breast cancer surveillance: 1.  Breast exam 04/27/2023: Benign 2. 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  She hopes to graduate after next semester. Return to clinic in 1 year for follow-up

## 2023-04-29 DIAGNOSIS — L811 Chloasma: Secondary | ICD-10-CM | POA: Diagnosis not present

## 2023-05-01 ENCOUNTER — Telehealth: Payer: Self-pay | Admitting: Hematology and Oncology

## 2023-05-01 NOTE — Telephone Encounter (Signed)
Scheduled appointment per 6/18 lo. Left voicemail for patient.

## 2023-05-04 ENCOUNTER — Other Ambulatory Visit (HOSPITAL_COMMUNITY): Payer: Self-pay

## 2023-05-21 ENCOUNTER — Other Ambulatory Visit: Payer: Self-pay

## 2023-05-21 ENCOUNTER — Other Ambulatory Visit (HOSPITAL_COMMUNITY): Payer: Self-pay

## 2023-05-24 ENCOUNTER — Other Ambulatory Visit: Payer: Self-pay

## 2023-06-09 ENCOUNTER — Ambulatory Visit: Payer: 59 | Admitting: Hematology and Oncology

## 2023-06-14 ENCOUNTER — Encounter: Payer: Self-pay | Admitting: Family Medicine

## 2023-06-14 ENCOUNTER — Ambulatory Visit (INDEPENDENT_AMBULATORY_CARE_PROVIDER_SITE_OTHER): Payer: 59 | Admitting: Family Medicine

## 2023-06-14 ENCOUNTER — Other Ambulatory Visit (HOSPITAL_COMMUNITY): Payer: Self-pay

## 2023-06-14 VITALS — BP 120/80 | HR 70 | Temp 97.3°F | Ht 63.0 in | Wt 143.0 lb

## 2023-06-14 DIAGNOSIS — F4323 Adjustment disorder with mixed anxiety and depressed mood: Secondary | ICD-10-CM | POA: Diagnosis not present

## 2023-06-14 DIAGNOSIS — G43909 Migraine, unspecified, not intractable, without status migrainosus: Secondary | ICD-10-CM | POA: Diagnosis not present

## 2023-06-14 DIAGNOSIS — Z114 Encounter for screening for human immunodeficiency virus [HIV]: Secondary | ICD-10-CM | POA: Diagnosis not present

## 2023-06-14 DIAGNOSIS — E781 Pure hyperglyceridemia: Secondary | ICD-10-CM

## 2023-06-14 DIAGNOSIS — I10 Essential (primary) hypertension: Secondary | ICD-10-CM | POA: Diagnosis not present

## 2023-06-14 DIAGNOSIS — Z0000 Encounter for general adult medical examination without abnormal findings: Secondary | ICD-10-CM

## 2023-06-14 DIAGNOSIS — R7303 Prediabetes: Secondary | ICD-10-CM | POA: Diagnosis not present

## 2023-06-14 DIAGNOSIS — Z1211 Encounter for screening for malignant neoplasm of colon: Secondary | ICD-10-CM

## 2023-06-14 DIAGNOSIS — Z1159 Encounter for screening for other viral diseases: Secondary | ICD-10-CM

## 2023-06-14 LAB — CBC
HCT: 36.7 % (ref 36.0–46.0)
Hemoglobin: 11.9 g/dL — ABNORMAL LOW (ref 12.0–15.0)
MCHC: 32.3 g/dL (ref 30.0–36.0)
MCV: 81.3 fl (ref 78.0–100.0)
Platelets: 287 10*3/uL (ref 150.0–400.0)
RBC: 4.52 Mil/uL (ref 3.87–5.11)
RDW: 14.5 % (ref 11.5–15.5)
WBC: 3.3 10*3/uL — ABNORMAL LOW (ref 4.0–10.5)

## 2023-06-14 LAB — COMPREHENSIVE METABOLIC PANEL
ALT: 18 U/L (ref 0–35)
AST: 19 U/L (ref 0–37)
Albumin: 3.9 g/dL (ref 3.5–5.2)
Alkaline Phosphatase: 81 U/L (ref 39–117)
BUN: 11 mg/dL (ref 6–23)
CO2: 28 mEq/L (ref 19–32)
Calcium: 9.2 mg/dL (ref 8.4–10.5)
Chloride: 104 mEq/L (ref 96–112)
Creatinine, Ser: 0.77 mg/dL (ref 0.40–1.20)
GFR: 92.23 mL/min (ref >60.00)
Glucose, Bld: 108 mg/dL — ABNORMAL HIGH (ref 70–99)
Potassium: 3.8 mEq/L (ref 3.5–5.1)
Sodium: 140 mEq/L (ref 135–145)
Total Bilirubin: 0.2 mg/dL (ref 0.2–1.2)
Total Protein: 7.2 g/dL (ref 6.0–8.3)

## 2023-06-14 LAB — LIPID PANEL
Cholesterol: 191 mg/dL (ref 0–200)
HDL: 67.3 mg/dL (ref >39.00)
LDL Cholesterol: 96 mg/dL (ref 0–99)
NonHDL: 123.22
Total CHOL/HDL Ratio: 3
Triglycerides: 136 mg/dL (ref 0.0–149.0)
VLDL: 27.2 mg/dL (ref 0.0–40.0)

## 2023-06-14 LAB — TSH: TSH: 4.23 u[IU]/mL (ref 0.35–5.50)

## 2023-06-14 LAB — HEMOGLOBIN A1C: Hgb A1c MFr Bld: 6.1 % (ref 4.6–6.5)

## 2023-06-14 MED ORDER — BUPROPION HCL ER (XL) 150 MG PO TB24
150.0000 mg | ORAL_TABLET | Freq: Every day | ORAL | 3 refills | Status: DC
  Filled 2023-06-14: qty 90, 90d supply, fill #0
  Filled 2024-01-26: qty 90, 90d supply, fill #1

## 2023-06-14 NOTE — Assessment & Plan Note (Signed)
Doing much better with this.  She is on verapamil 360 mg daily which works well.

## 2023-06-14 NOTE — Patient Instructions (Signed)
It was very nice to see you today!  Please continue to work on diet and exercise.  We will start Wellbutrin.  Will check blood work and refer you for colonoscopy.  Please send me a message in a few weeks to let me know how the Wellbutrin is working for you.  Return in about 1 year (around 06/13/2024) for Annual Physical.   Take care, Dr Jimmey Ralph  PLEASE NOTE:  If you had any lab tests, please let us know if you have not heard back within a few days. You may see your results on mychart before we have a chance to review them but we will give you a call once they are reviewed by [place].   If we ordered any referrals today, please let us know if you have not heard from their office within the next week.   If you had any urgent prescriptions sent in today, please check with the pharmacy within an hour of our visit to make sure the prescription was transmitted appropriately.   Please try these tips to maintain a healthy lifestyle:  Eat at least 3 REAL meals and 1-2 snacks per day.  Aim for no more than 5 hours between eating.  If you eat breakfast, please do so within one hour of getting up.   Each meal should contain half fruits/vegetables, one quarter protein, and one quarter carbs (no bigger than a computer mouse)  Cut down on sweet beverages. This includes juice, soda, and sweet tea.   Drink at least 1 glass of water with each meal and aim for at least 8 glasses per day  Exercise at least 150 minutes every week.     Preventive Care 38-30 Years Old, Female Preventive care refers to lifestyle choices and visits with your health care provider that can promote health and wellness. Preventive care visits are also called wellness exams. What can I expect for my preventive care visit? Counseling Your health care provider may ask you questions about your: Medical history, including: Past medical problems. Family medical history. Pregnancy history. Current health, including: Menstrual  cycle. Method of birth control. Emotional well-being. Home life and relationship well-being. Sexual activity and sexual health. Lifestyle, including: Alcohol, nicotine or tobacco, and drug use. Access to firearms. Diet, exercise, and sleep habits. Work and work Astronomer. Sunscreen use. Safety issues such as seatbelt and bike helmet use. Physical exam Your health care provider will check your: Height and weight. These may be used to calculate your BMI (body mass index). BMI is a measurement that tells if you are at a healthy weight. Waist circumference. This measures the distance around your waistline. This measurement also tells if you are at a healthy weight and may help predict your risk of certain diseases, such as type 2 diabetes and high blood pressure. Heart rate and blood pressure. Body temperature. Skin for abnormal spots. What immunizations do I need?  Vaccines are usually given at various ages, according to a schedule. Your health care provider will recommend vaccines for you based on your age, medical history, and lifestyle or other factors, such as travel or where you work. What tests do I need? Screening Your health care provider may recommend screening tests for certain conditions. This may include: Lipid and cholesterol levels. Diabetes screening. This is done by checking your blood sugar (glucose) after you have not eaten for a while (fasting). Pelvic exam and Pap test. Hepatitis B test. Hepatitis C test. HIV (human immunodeficiency virus) test. STI (sexually transmitted  infection) testing, if you are at risk. Lung cancer screening. Colorectal cancer screening. Mammogram. Talk with your health care provider about when you should start having regular mammograms. This may depend on whether you have a family history of breast cancer. BRCA-related cancer screening. This may be done if you have a family history of breast, ovarian, tubal, or peritoneal cancers. Bone  density scan. This is done to screen for osteoporosis. Talk with your health care provider about your test results, treatment options, and if necessary, the need for more tests. Follow these instructions at home: Eating and drinking  Eat a diet that includes fresh fruits and vegetables, whole grains, lean protein, and low-fat dairy products. Take vitamin and mineral supplements as recommended by your health care provider. Do not drink alcohol if: Your health care provider tells you not to drink. You are pregnant, may be pregnant, or are planning to become pregnant. If you drink alcohol: Limit how much you have to 0-1 drink a day. Know how much alcohol is in your drink. In the [place] one drink equals one 12 oz bottle of beer (355 mL), one 5 oz glass of wine (148 mL), or one 1 oz glass of hard liquor (44 mL). Lifestyle Brush your teeth every morning and night with fluoride toothpaste. Floss one time each day. Exercise for at least 30 minutes 5 or more days each week. Do not use any products that contain nicotine or tobacco. These products include cigarettes, chewing tobacco, and vaping devices, such as e-cigarettes. If you need help quitting, ask your health care provider. Do not use drugs. If you are sexually active, practice safe sex. Use a condom or other form of protection to prevent STIs. If you do not wish to become pregnant, use a form of birth control. If you plan to become pregnant, see your health care provider for a prepregnancy visit. Take aspirin only as told by your health care provider. Make sure that you understand how much to take and what form to take. Work with your health care provider to find out whether it is safe and beneficial for you to take aspirin daily. Find healthy ways to manage stress, such as: Meditation, yoga, or listening to music. Journaling. Talking to a trusted person. Spending time with friends and family. Minimize exposure to UV radiation to reduce your  risk of skin cancer. Safety Always wear your seat belt while driving or riding in a vehicle. Do not drive: If you have been drinking alcohol. Do not ride with someone who has been drinking. When you are tired or distracted. While texting. If you have been using any mind-altering substances or drugs. Wear a helmet and other protective equipment during sports activities. If you have firearms in your house, make sure you follow all gun safety procedures. Seek help if you have been physically or sexually abused. What's next? Visit your health care provider once a year for an annual wellness visit. Ask your health care provider how often you should have your eyes and teeth checked. Stay up to date on all vaccines. This information is not intended to replace advice given to you by your health care provider. Make sure you discuss any questions you have with your health care provider. Document Revised: 04/23/2021 Document Reviewed: 04/23/2021 Elsevier Patient Education  2024 [employer].

## 2023-06-14 NOTE — Assessment & Plan Note (Signed)
Check lipids. Discussed lifestyle modifications.  

## 2023-06-14 NOTE — Assessment & Plan Note (Signed)
Well-controlled today.  She has had a few elevated readings recently.  She is doing well on verapamil 360 mg daily.  We did discuss potentially changing dose of medications today however will defer.  She will continue monitor at home.  Can increase dose of verapamil to 480 mg total daily if needed if home blood pressure readings are not at goal.

## 2023-06-14 NOTE — Assessment & Plan Note (Signed)
Check A1c.  Discussed lifestyle modifications. °

## 2023-06-14 NOTE — Assessment & Plan Note (Signed)
Controlled.  Significant elevated PHQ today.  No reported SI or HI.  She has been on a few SSRIs and Effexor in the past which did not work well.  We discussed treatment options.  She is not interested in seeing a therapist at this point.  Will start Wellbutrin 150 mg daily.  We discussed potential side effects.  She will follow-up with me in a few weeks via MyChart and we can titrate as needed.

## 2023-06-14 NOTE — Progress Notes (Signed)
Chief Complaint:  Victoria Maddox is a 47 y.o. female who presents today for her annual comprehensive physical exam.    Assessment/Plan:  Chronic Problems Addressed Today: Prediabetes Check A1c.  Discussed lifestyle modifications.  Hypertriglyceridemia Check lipids.  Discussed lifestyle modifications.  HTN (hypertension) Well-controlled today.  She has had a few elevated readings recently.  She is doing well on verapamil 360 mg daily.  We did discuss potentially changing dose of medications today however will defer.  She will continue monitor at home.  Can increase dose of verapamil to 480 mg total daily if needed if home blood pressure readings are not at goal.  Adjustment disorder with mixed anxiety and depressed mood Controlled.  Significant elevated PHQ today.  No reported SI or HI.  She has been on a few SSRIs and Effexor in the past which did not work well.  We discussed treatment options.  She is not interested in seeing a therapist at this point.  Will start Wellbutrin 150 mg daily.  We discussed potential side effects.  She will follow-up with me in a few weeks via MyChart and we can titrate as needed.  Migraine Doing much better with this.  She is on verapamil 360 mg daily which works well.  Preventative Healthcare: Check labs. Will refer for colonoscopy.  Follows with GYN for women's health.  Patient Counseling(The following topics were reviewed and/or handout was given):  -Nutrition: Stressed importance of moderation in sodium/caffeine intake, saturated fat and cholesterol, caloric balance, sufficient intake of fresh fruits, vegetables, and fiber.  -Stressed the importance of regular exercise.   -Substance Abuse: Discussed cessation/primary prevention of tobacco, alcohol, or other drug use; driving or other dangerous activities under the influence; availability of treatment for abuse.   -Injury prevention: Discussed safety belts, safety helmets, smoke detector, smoking near  bedding or upholstery.   -Sexuality: Discussed sexually transmitted diseases, partner selection, use of condoms, avoidance of unintended pregnancy and contraceptive alternatives.   -Dental health: Discussed importance of regular tooth brushing, flossing, and dental visits.  -Health maintenance and immunizations reviewed. Please refer to Health maintenance section.  Return to care in 1 year for next preventative visit.     Subjective:  HPI:  She has no acute complaints today.   See A/P for status of chronic conditions.  Lifestyle Diet: None specific.  Exercise: None specific.      06/14/2023   10:30 AM  Depression screen PHQ 2/9  Decreased Interest 2  Down, Depressed, Hopeless 3  PHQ - 2 Score 5  Altered sleeping 3  Tired, decreased energy 3  Change in appetite 3  Feeling bad or failure about yourself  1  Trouble concentrating 0  Moving slowly or fidgety/restless 1  Suicidal thoughts 0  PHQ-9 Score 16  Difficult doing work/chores Very difficult    Health Maintenance Due  Topic Date Due   HIV Screening  Never done   Hepatitis C Screening  Never done   Colonoscopy  Never done   MAMMOGRAM  03/24/2023     ROS: Per HPI, otherwise a complete review of systems was negative.   PMH:  The following were reviewed and entered/updated in epic: Past Medical History:  Diagnosis Date   Allergy    Depression    Family history of breast cancer    Family history of kidney cancer    Family history of throat cancer    Frequent headaches    Heart murmur    History of recurrent UTIs  Hypertension    Migraines    Personal history of radiation therapy    Urinary incontinence    Patient Active Problem List   Diagnosis Date Noted   Cervical radiculopathy 03/26/2022   Migraine 12/26/2021   Prediabetes 07/08/2021   Shoulder pain, bilateral 07/08/2021   Hypertriglyceridemia 07/08/2021   GERD (gastroesophageal reflux disease) 07/08/2021   Upper back pain 07/08/2021   Genetic  testing 02/22/2020   Family history of breast cancer    Family history of throat cancer    Family history of kidney cancer    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   HTN (hypertension) 05/04/2018   Insomnia 05/04/2018   Adjustment disorder with mixed anxiety and depressed mood 05/04/2018   Past Surgical History:  Procedure Laterality Date   BREAST BIOPSY Left 01/29/2020   BREAST BIOPSY Left 02/06/2020   BREAST LUMPECTOMY Left 03/12/2020   BREAST LUMPECTOMY WITH RADIOACTIVE SEED LOCALIZATION Left 03/12/2020   Procedure: LEFT BREAST LUMPECTOMY WITH BRACKETED RADIOACTIVE SEED LOCALIZATION;  Surgeon: Emelia Loron, MD;  Location: [facility];  Service: General;  Laterality: Left;   DILATION AND CURETTAGE OF UTERUS      Family History  Problem Relation Age of Onset   Heart attack Mother    Heart disease Mother    Hypertension Mother    Early death Father        unknown cause   Hypertension Father    Hypertension Sister    Diabetes Brother    Hypertension Brother    Asthma Sister    Hypertension Sister    Hyperlipidemia Brother    Hypertension Brother    Mental illness Brother    Heart disease Sister    Hypertension Sister    Arthritis Brother    Diabetes Brother    Hypertension Brother    Breast cancer Cousin 43       paternal first cousin   Throat cancer Sister 41       non-smoker   Breast cancer Cousin        dx. in her 56s, paternal first cousin once removed   Kidney cancer Other 6    Medications- reviewed and updated Current Outpatient Medications  Medication Sig Dispense Refill   buPROPion (WELLBUTRIN XL) 150 MG 24 hr tablet Take 1 tablet (150 mg total) by mouth daily. 90 tablet 3   diphenhydrAMINE (BENADRYL) 25 mg capsule Take 25 mg by mouth at bedtime as needed.     hydrochlorothiazide (HYDRODIURIL) 25 MG tablet Take by mouth.     tamoxifen (NOLVADEX) 20 MG tablet Take 1 tablet (20 mg) by mouth daily. 90 tablet 3    tiZANidine (ZANAFLEX) 2 MG tablet Take 1-2 tablets (2-4 mg total) by mouth every 8 (eight) hours as needed for muscle spasms (Or for Migraine pain.). 60 tablet 1   verapamil (CALAN-SR) 180 MG CR tablet Take 2 tablets (360 mg total) by mouth every morning. 180 tablet 3   No current facility-administered medications for this visit.    Allergies-reviewed and updated Allergies  Allergen Reactions   Trazodone And Nefazodone     Dizziness   Zoloft [Sertraline Hcl]     Anxiousness, lash out at kids    Social History   Socioeconomic History   Marital status: Married    Spouse name: Not on file   Number of children: Not on file   Years of education: Not on file   Highest education level: Not on file  Occupational History  Not on file  Tobacco Use   Smoking status: Never   Smokeless tobacco: Never  Vaping Use   Vaping status: Never Used  Substance and Sexual Activity   Alcohol use: Not Currently   Drug use: Never   Sexual activity: Yes    Partners: Male  Other Topics Concern   Not on file  Social History Narrative   Not on file   Social Determinants of Health   Financial Resource Strain: Not on file  Food Insecurity: Not on file  Transportation Needs: Not on file  Physical Activity: Not on file  Stress: Not on file  Social Connections: Not on file        Objective:  Physical Exam: BP 120/80   Pulse 70   Temp (!) 97.3 F (36.3 C) (Temporal)   Ht 5\' 3"  (1.6 m)   Wt 143 lb (64.9 kg)   LMP  (LMP Unknown)   SpO2 97%   BMI 25.33 kg/m   Body mass index is 25.33 kg/m. Wt Readings from Last 3 Encounters:  06/14/23 143 lb (64.9 kg)  04/27/23 139 lb 12.8 oz (63.4 kg)  02/23/23 137 lb (62.1 kg)   Gen: NAD, resting comfortably HEENT: TMs normal bilaterally. OP clear. No thyromegaly noted.  CV: RRR with no murmurs appreciated Pulm: NWOB, CTAB with no crackles, wheezes, or rhonchi GI: Normal bowel sounds present. Soft, Nontender, Nondistended. MSK: no edema,  cyanosis, or clubbing noted Skin: warm, dry Neuro: CN2-12 grossly intact. Strength 5/5 in upper and lower extremities. Reflexes symmetric and intact bilaterally.  Psych: Normal affect and thought content     Ewen Varnell M. Jimmey Ralph, MD 06/14/2023 10:53 AM

## 2023-06-16 ENCOUNTER — Ambulatory Visit: Admission: RE | Admit: 2023-06-16 | Payer: 59 | Source: Ambulatory Visit

## 2023-06-16 DIAGNOSIS — R921 Mammographic calcification found on diagnostic imaging of breast: Secondary | ICD-10-CM | POA: Diagnosis not present

## 2023-06-16 DIAGNOSIS — D0512 Intraductal carcinoma in situ of left breast: Secondary | ICD-10-CM

## 2023-06-16 NOTE — Progress Notes (Signed)
Her blood sugar is borderline diabetic but everything else is stable.  She should continue to work on diet and exercise and we can recheck everything in a year.

## 2023-06-22 ENCOUNTER — Other Ambulatory Visit (HOSPITAL_COMMUNITY): Payer: Self-pay

## 2023-06-22 ENCOUNTER — Inpatient Hospital Stay: Payer: 59 | Attending: Hematology and Oncology | Admitting: Hematology and Oncology

## 2023-06-22 ENCOUNTER — Other Ambulatory Visit: Payer: Self-pay

## 2023-06-22 VITALS — BP 152/73 | HR 64 | Temp 97.5°F | Resp 17 | Wt 143.0 lb

## 2023-06-22 DIAGNOSIS — D0512 Intraductal carcinoma in situ of left breast: Secondary | ICD-10-CM | POA: Diagnosis not present

## 2023-06-22 DIAGNOSIS — Z923 Personal history of irradiation: Secondary | ICD-10-CM | POA: Insufficient documentation

## 2023-06-22 DIAGNOSIS — Z79810 Long term (current) use of selective estrogen receptor modulators (SERMs): Secondary | ICD-10-CM | POA: Diagnosis not present

## 2023-06-22 NOTE — Assessment & Plan Note (Addendum)
02/08/2020: Screening mammogram detected left breast calcifications, 2.9cm. Biopsy showed DCIS with calcifications, low grade, ER+ 70%, PR+ 90%.  Additional biopsy: Flat epithelial atypia which needs excision Tis NX stage 0   03/12/2020: Left lumpectomy: Flat epithelial atypia radial scar uninvolved by DCIS. Adjuvant radiation therapy completed 06/06/2020   Current treatment: antiestrogen therapy with tamoxifen 5 years started July 2021   Tamoxifen toxicities: 1.  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 Her daughter joined college at [employer]. Return to clinic in 1 year for follow-up

## 2023-06-22 NOTE — Progress Notes (Signed)
Patient Care Team: Ardith Dark, MD as PCP - General (Family Medicine) Emelia Loron, MD as Consulting Physician (General Surgery) Serena Croissant, MD as Consulting Physician (Hematology and Oncology) Dorothy Puffer, MD as Consulting Physician (Radiation Oncology) Shea Evans, MD as Consulting Physician (Obstetrics and Gynecology) Ardith Dark, MD as Consulting Physician (Family Medicine)  DIAGNOSIS:  Encounter Diagnosis  Name Primary?   Ductal carcinoma in situ (DCIS) of left breast Yes    SUMMARY OF ONCOLOGIC HISTORY: Oncology History  Ductal carcinoma in situ (DCIS) of left breast  02/08/2020 Initial Diagnosis   Screening mammogram detected left breast calcifications, 2.9cm. Biopsy showed DCIS with calcifications, low grade, ER+ 70%, PR+ 90%.   02/20/2020 Genetic Testing   Negative genetic testing:  No pathogenic variants detected on the [facility] Breast Cancer STAT or Multi-Cancer Panels. The report date is 02/20/2020.  The Breast Cancer STAT Panel offered by [facility] includes sequencing and deletion/duplication analysis for the following 9 genes:  ATM, BRCA1, BRCA2, CDH1, CHEK2, PALB2, PTEN, STK11 and TP53.  The Multi-Cancer Panel offered by [facility] includes sequencing and/or deletion duplication testing of the following 85 genes: AIP, ALK, APC, ATM, AXIN2,BAP1,  BARD1, BLM, BMPR1A, BRCA1, BRCA2, BRIP1, CASR, CDC73, CDH1, CDK4, CDKN1B, CDKN1C, CDKN2A (p14ARF), CDKN2A (p16INK4a), CEBPA, CHEK2, CTNNA1, DICER1, DIS3L2, EGFR (c.2369C>T, p.Thr790Met variant only), EPCAM (Deletion/duplication testing only), FH, FLCN, GATA2, GPC3, GREM1 (Promoter region deletion/duplication testing only), HOXB13 (c.251G>A, p.Gly84Glu), HRAS, KIT, MAX, MEN1, MET, MITF (c.952G>A, p.Glu318Lys variant only), MLH1, MSH2, MSH3, MSH6, MUTYH, NBN, NF1, NF2, NTHL1, PALB2, PDGFRA, PHOX2B, PMS2, POLD1, POLE, POT1, PRKAR1A, PTCH1, PTEN, RAD50, RAD51C, RAD51D, RB1, RECQL4, RET, RNF43, RUNX1, SDHAF2, SDHA (sequence  changes only), SDHB, SDHC, SDHD, SMAD4, SMARCA4, SMARCB1, SMARCE1, STK11, SUFU, TERC, TERT, TMEM127, TP53, TSC1, TSC2, VHL, WRN and WT1.   03/12/2020 Surgery   Left lumpectomy [name]) [phone]): flat epithelial atypia with calcifications. Negative margins. No regional lymph nodes were examined. ER/PR +.   04/23/2020 - 06/07/2020 Radiation Therapy   The patient initially received a dose of 50.4 Gy in 28 fractions to the breast using whole-breast tangent fields. This was delivered using a 3-D conformal technique. The pt received a boost delivering an additional 10 Gy in 5 fractions using a electron boost with electrons. The total dose was 60.4 Gy.   06/2020 - 06/2025 Anti-estrogen oral therapy   Tamoxifen     CHIEF COMPLIANT: Follow-up of left breast DCIS on tamoxifen   INTERVAL HISTORY: Victoria Maddox is a 47 y.o. with above-mentioned history of left breast DCIS who underwent a left lumpectomy, radiation, and is currently on antiestrogen therapy with tamoxifen. She presents to the clinic today for follow-up.  Patient is tolerating tamoxifen fairly well. She says she has severe hot flashes, but it is tolerable. Denies any pain or discomfort in breast.  ALLERGIES:  is allergic to trazodone and nefazodone and zoloft [sertraline hcl].  MEDICATIONS:  Current Outpatient Medications  Medication Sig Dispense Refill   buPROPion (WELLBUTRIN XL) 150 MG 24 hr tablet Take 1 tablet (150 mg total) by mouth daily. 90 tablet 3   diphenhydrAMINE (BENADRYL) 25 mg capsule Take 25 mg by mouth at bedtime as needed.     hydrochlorothiazide (HYDRODIURIL) 25 MG tablet Take by mouth.     tamoxifen (NOLVADEX) 20 MG tablet Take 1 tablet (20 mg) by mouth daily. 90 tablet 3   tiZANidine (ZANAFLEX) 2 MG tablet Take 1-2 tablets (2-4 mg total) by mouth every 8 (eight) hours as needed for muscle spasms (  Or for Migraine pain.). 60 tablet 1   verapamil (CALAN-SR) 180 MG CR tablet Take 2 tablets (360 mg total) by  mouth every morning. 180 tablet 3   No current facility-administered medications for this visit.    PHYSICAL EXAMINATION: ECOG PERFORMANCE STATUS: 1 - Symptomatic but completely ambulatory  Vitals:   06/22/23 0811  BP: (!) 152/73  Pulse: 64  Resp: 17  Temp: (!) 97.5 F (36.4 C)  SpO2: 100%   Filed Weights   06/22/23 0811  Weight: 143 lb (64.9 kg)     LABORATORY DATA:  I have reviewed the data as listed    Latest Ref Rng & Units 06/14/2023   10:55 AM 05/12/2022    4:35 AM 01/27/2022    9:05 AM  CMP  Glucose 70 - 99 mg/dL 782  956  99   BUN 6 - 23 mg/dL 11  14  17    Creatinine 0.40 - 1.20 mg/dL 2.13  0.86  5.78   Sodium 135 - 145 mEq/L 140  142  139   Potassium 3.5 - 5.1 mEq/L 3.8  3.1  3.9   Chloride 96 - 112 mEq/L 104  107  103   CO2 19 - 32 mEq/L 28  28  32   Calcium 8.4 - 10.5 mg/dL 9.2  9.2  9.6   Total Protein 6.0 - 8.3 g/dL 7.2  7.5  7.5   Total Bilirubin 0.2 - 1.2 mg/dL 0.2  0.4  0.3   Alkaline Phos 39 - 117 U/L 81  76  72   AST 0 - 37 U/L 19  20  14    ALT 0 - 35 U/L 18  19  13      Lab Results  Component Value Date   WBC 3.3 (L) 06/14/2023   HGB 11.9 (L) 06/14/2023   HCT 36.7 06/14/2023   MCV 81.3 06/14/2023   PLT 287.0 06/14/2023   NEUTROABS 2.3 05/12/2022    ASSESSMENT & PLAN:  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 Biopsy showed DCIS with calcifications, low grade, ER+ 70%, PR+ 90%.  Additional biopsy: Flat epithelial atypia which needs excision Tis NX stage 0   03/12/2020: Left lumpectomy: Flat epithelial atypia radial scar uninvolved by DCIS. Adjuvant radiation therapy completed 06/06/2020   Current treatment: antiestrogen therapy with tamoxifen 5 years started July 2021   Tamoxifen toxicities: 1.  Hot flashes and mood swings: Currently on Wellbutrin   CT chest March 2022: 3 mm lung nodule: I discussed with her that it appears to be benign  CT chest 02/04/2022:  Stable tiny right upper lobe lung nodules favored benign   Breast cancer surveillance: Mammogram 06/16/2023: Probably benign right breast calcifications stable, density category C   Lung nodule follow-up: Will obtain another CT scan to make sure it is stable.  I will call her with the result of the scan.   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 Her daughter joined college at [employer]. Return to clinic in 1 year for follow-up     Orders Placed This Encounter  Procedures   MM DIAG BREAST TOMO BILATERAL    Standing Status:   Future    Standing Expiration Date:   06/21/2024    Order Specific Question:   Reason for Exam (SYMPTOM  OR DIAGNOSIS REQUIRED)    Answer:   annual mammogram    Order  Specific Question:   Is the patient pregnant?    Answer:   No    Order Specific Question:   Preferred imaging location?    Answer:   [facility]   CT Chest W Contrast    Standing Status:   Future    Standing Expiration Date:   06/21/2024    Order Specific Question:   If indicated for the ordered procedure, I authorize the administration of contrast media per Radiology protocol    Answer:   Yes    Order Specific Question:   Does the patient have a contrast media/X-ray dye allergy?    Answer:   No    Order Specific Question:   Is patient pregnant?    Answer:   No    Order Specific Question:   Preferred imaging location?    Answer:   [facility]    Order Specific Question:   Release to patient    Answer:   Immediate [1]   The patient has a good understanding of the overall plan. she agrees with it. she will call with any problems that may develop before the next visit here. Total time spent: 30 mins including face to face time and time spent for planning, charting and co-ordination of care   Tamsen Meek, MD 06/22/23    I Janan Ridge am acting as a Neurosurgeon for [employer]  I have reviewed the above  documentation for accuracy and completeness, and I agree with the above.

## 2023-06-23 ENCOUNTER — Telehealth: Payer: Self-pay | Admitting: Hematology and Oncology

## 2023-06-23 NOTE — Telephone Encounter (Signed)
Scheduled appointments per 8/13 los. Patient is aware of the made appointments.

## 2023-06-29 ENCOUNTER — Ambulatory Visit (HOSPITAL_COMMUNITY)
Admission: RE | Admit: 2023-06-29 | Discharge: 2023-06-29 | Disposition: A | Discharge status: Home / Self Care | Payer: 59 | Source: Ambulatory Visit | Attending: Hematology and Oncology | Admitting: Hematology and Oncology

## 2023-06-29 DIAGNOSIS — J8410 Pulmonary fibrosis, unspecified: Secondary | ICD-10-CM | POA: Diagnosis not present

## 2023-06-29 DIAGNOSIS — D0512 Intraductal carcinoma in situ of left breast: Secondary | ICD-10-CM | POA: Diagnosis not present

## 2023-06-29 DIAGNOSIS — R911 Solitary pulmonary nodule: Secondary | ICD-10-CM | POA: Diagnosis not present

## 2023-06-29 DIAGNOSIS — R918 Other nonspecific abnormal finding of lung field: Secondary | ICD-10-CM | POA: Diagnosis not present

## 2023-06-29 MED ORDER — IOHEXOL 300 MG/ML  SOLN
75.0000 mL | Freq: Once | INTRAMUSCULAR | Status: AC | PRN
  Administered 2023-06-29: 75 mL via INTRAVENOUS

## 2023-07-06 ENCOUNTER — Inpatient Hospital Stay (HOSPITAL_BASED_OUTPATIENT_CLINIC_OR_DEPARTMENT_OTHER): Payer: 59 | Admitting: Hematology and Oncology

## 2023-07-06 DIAGNOSIS — D0512 Intraductal carcinoma in situ of left breast: Secondary | ICD-10-CM | POA: Diagnosis not present

## 2023-07-06 NOTE — Assessment & Plan Note (Signed)
02/08/2020: Screening mammogram detected left breast calcifications, 2.9cm. Biopsy showed DCIS with calcifications, low grade, ER+ 70%, PR+ 90%.  Additional biopsy: Flat epithelial atypia which needs excision Tis NX stage 0   03/12/2020: Left lumpectomy: Flat epithelial atypia radial scar uninvolved by DCIS. Adjuvant radiation therapy completed 06/06/2020   Current treatment: antiestrogen therapy with tamoxifen 5 years started July 2021   Tamoxifen toxicities: 1.  Hot flashes and mood swings: Currently on Wellbutrin   CT chest March 2022: 3 mm lung nodule: I discussed with her that it appears to be benign  CT chest 02/04/2022: Stable tiny right upper lobe lung nodules favored benign   Breast cancer surveillance: Mammogram 06/16/2023: Probably benign right breast calcifications stable, density category C CT chest 06/28/2022:   Lung nodule follow-up: Will obtain another CT scan to make sure it is stable.

## 2023-07-06 NOTE — Progress Notes (Signed)
HEMATOLOGY-ONCOLOGY TELEPHONE VISIT PROGRESS NOTE  I connected with our patient on 07/06/23 at  1:30 PM EDT by telephone and verified that I am speaking with the correct person using two identifiers.  I discussed the limitations, risks, security and privacy concerns of performing an evaluation and management service by telephone and the availability of in person appointments.  I also discussed with the patient that there may be a patient responsible charge related to this service. The patient expressed understanding and agreed to proceed.   History of Present Illness:  Victoria Maddox is a 47 y.o. with above-mentioned history of left breast DCIS who underwent a left lumpectomy, radiation, and is currently on antiestrogen therapy with tamoxifen. She presents to the clinic today for a telephone follow-up to discuss the results of CT chest.  Oncology History  Ductal carcinoma in situ (DCIS) of left breast  02/08/2020 Initial Diagnosis   Screening mammogram detected left breast calcifications, 2.9cm. Biopsy showed DCIS with calcifications, low grade, ER+ 70%, PR+ 90%.   02/20/2020 Genetic Testing   Negative genetic testing:  No pathogenic variants detected on the [facility] Breast Cancer STAT or Multi-Cancer Panels. The report date is 02/20/2020.  The Breast Cancer STAT Panel offered by [facility] includes sequencing and deletion/duplication analysis for the following 9 genes:  ATM, BRCA1, BRCA2, CDH1, CHEK2, PALB2, PTEN, STK11 and TP53.  The Multi-Cancer Panel offered by [facility] includes sequencing and/or deletion duplication testing of the following 85 genes: AIP, ALK, APC, ATM, AXIN2,BAP1,  BARD1, BLM, BMPR1A, BRCA1, BRCA2, BRIP1, CASR, CDC73, CDH1, CDK4, CDKN1B, CDKN1C, CDKN2A (p14ARF), CDKN2A (p16INK4a), CEBPA, CHEK2, CTNNA1, DICER1, DIS3L2, EGFR (c.2369C>T, p.Thr790Met variant only), EPCAM (Deletion/duplication testing only), FH, FLCN, GATA2, GPC3, GREM1 (Promoter region deletion/duplication testing only),  HOXB13 (c.251G>A, p.Gly84Glu), HRAS, KIT, MAX, MEN1, MET, MITF (c.952G>A, p.Glu318Lys variant only), MLH1, MSH2, MSH3, MSH6, MUTYH, NBN, NF1, NF2, NTHL1, PALB2, PDGFRA, PHOX2B, PMS2, POLD1, POLE, POT1, PRKAR1A, PTCH1, PTEN, RAD50, RAD51C, RAD51D, RB1, RECQL4, RET, RNF43, RUNX1, SDHAF2, SDHA (sequence changes only), SDHB, SDHC, SDHD, SMAD4, SMARCA4, SMARCB1, SMARCE1, STK11, SUFU, TERC, TERT, TMEM127, TP53, TSC1, TSC2, VHL, WRN and WT1.   03/12/2020 Surgery   Left lumpectomy [name]) [phone]): flat epithelial atypia with calcifications. Negative margins. No regional lymph nodes were examined. ER/PR +.   04/23/2020 - 06/07/2020 Radiation Therapy   The patient initially received a dose of 50.4 Gy in 28 fractions to the breast using whole-breast tangent fields. This was delivered using a 3-D conformal technique. The pt received a boost delivering an additional 10 Gy in 5 fractions using a electron boost with electrons. The total dose was 60.4 Gy.   06/2020 - 06/2025 Anti-estrogen oral therapy   Tamoxifen     REVIEW OF SYSTEMS:   Constitutional: Denies fevers, chills or abnormal weight loss All other systems were reviewed with the patient and are negative. 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 Biopsy showed DCIS with calcifications, low grade, ER+ 70%, PR+ 90%.  Additional biopsy: Flat epithelial atypia which needs excision Tis NX stage 0   03/12/2020: Left lumpectomy: Flat epithelial atypia radial scar uninvolved by DCIS. Adjuvant radiation therapy completed 06/06/2020   Current treatment: antiestrogen therapy with tamoxifen 5 years started July 2021   Tamoxifen toxicities: 1.  Hot flashes and mood swings: Currently on Wellbutrin   CT chest March 2022: 3 mm lung nodule: I discussed with her that it appears to be  benign  CT chest 02/04/2022: Stable tiny  right upper lobe lung nodules favored benign   Breast cancer surveillance: Mammogram 06/16/2023: Probably benign right breast calcifications stable, density category C CT chest 06/28/2022: unchanged lung nodules 3 mm benign. Hepatic steatosis   RTC in 1 year   I discussed the assessment and treatment plan with the patient. The patient was provided an opportunity to ask questions and all were answered. The patient agreed with the plan and demonstrated an understanding of the instructions. The patient was advised to call back or seek an in-person evaluation if the symptoms worsen or if the condition fails to improve as anticipated.   I provided 12 minutes of non-face-to-face time during this encounter.  This includes time for charting and coordination of care   Tamsen Meek, MD  I Janan Ridge am acting as a scribe for Dr.Shonya Sumida  I have reviewed the above documentation for accuracy and completeness, and I agree with the above.

## 2023-07-14 ENCOUNTER — Emergency Department (HOSPITAL_BASED_OUTPATIENT_CLINIC_OR_DEPARTMENT_OTHER): Payer: 59 | Admitting: Radiology

## 2023-07-14 ENCOUNTER — Encounter (HOSPITAL_BASED_OUTPATIENT_CLINIC_OR_DEPARTMENT_OTHER): Payer: Self-pay | Admitting: Emergency Medicine

## 2023-07-14 ENCOUNTER — Other Ambulatory Visit: Payer: Self-pay

## 2023-07-14 ENCOUNTER — Emergency Department (HOSPITAL_BASED_OUTPATIENT_CLINIC_OR_DEPARTMENT_OTHER)
Admission: EM | Admit: 2023-07-14 | Discharge: 2023-07-14 | Disposition: A | Discharge status: Home / Self Care | Payer: 59 | Attending: Emergency Medicine | Admitting: Emergency Medicine

## 2023-07-14 DIAGNOSIS — R053 Chronic cough: Secondary | ICD-10-CM | POA: Diagnosis not present

## 2023-07-14 DIAGNOSIS — R079 Chest pain, unspecified: Secondary | ICD-10-CM | POA: Diagnosis not present

## 2023-07-14 DIAGNOSIS — R059 Cough, unspecified: Secondary | ICD-10-CM | POA: Diagnosis not present

## 2023-07-14 DIAGNOSIS — R0789 Other chest pain: Secondary | ICD-10-CM | POA: Diagnosis not present

## 2023-07-14 DIAGNOSIS — I10 Essential (primary) hypertension: Secondary | ICD-10-CM | POA: Diagnosis not present

## 2023-07-14 DIAGNOSIS — Z79899 Other long term (current) drug therapy: Secondary | ICD-10-CM | POA: Insufficient documentation

## 2023-07-14 DIAGNOSIS — K30 Functional dyspepsia: Secondary | ICD-10-CM | POA: Insufficient documentation

## 2023-07-14 DIAGNOSIS — Z853 Personal history of malignant neoplasm of breast: Secondary | ICD-10-CM | POA: Diagnosis not present

## 2023-07-14 LAB — CBC
HCT: 37.4 % (ref 36.0–46.0)
Hemoglobin: 12.2 g/dL (ref 12.0–15.0)
MCH: 26.1 pg (ref 26.0–34.0)
MCHC: 32.6 g/dL (ref 30.0–36.0)
MCV: 79.9 fL — ABNORMAL LOW (ref 80.0–100.0)
Platelets: 262 10*3/uL (ref 150–400)
RBC: 4.68 MIL/uL (ref 3.87–5.11)
RDW: 14.1 % (ref 11.5–15.5)
WBC: 3.5 10*3/uL — ABNORMAL LOW (ref 4.0–10.5)
nRBC: 0 % (ref 0.0–0.2)

## 2023-07-14 LAB — BASIC METABOLIC PANEL
Anion gap: 8 (ref 5–15)
BUN: 13 mg/dL (ref 6–20)
CO2: 29 mmol/L (ref 22–32)
Calcium: 8.9 mg/dL (ref 8.9–10.3)
Chloride: 101 mmol/L (ref 98–111)
Creatinine, Ser: 0.75 mg/dL (ref 0.44–1.00)
GFR, Estimated: >60 mL/min (ref >60)
Glucose, Bld: 101 mg/dL — ABNORMAL HIGH (ref 70–99)
Potassium: 3.9 mmol/L (ref 3.5–5.1)
Sodium: 138 mmol/L (ref 135–145)

## 2023-07-14 LAB — TROPONIN I (HIGH SENSITIVITY): Troponin I (High Sensitivity): 3 ng/L (ref <18)

## 2023-07-14 LAB — HCG, SERUM, QUALITATIVE: Preg, Serum: NEGATIVE

## 2023-07-14 MED ORDER — KETOROLAC TROMETHAMINE 60 MG/2ML IM SOLN
30.0000 mg | Freq: Once | INTRAMUSCULAR | Status: AC
  Administered 2023-07-14: 30 mg via INTRAMUSCULAR
  Filled 2023-07-14: qty 2

## 2023-07-14 MED ORDER — PANTOPRAZOLE SODIUM 40 MG PO TBEC
40.0000 mg | DELAYED_RELEASE_TABLET | Freq: Once | ORAL | Status: AC
  Administered 2023-07-14: 40 mg via ORAL
  Filled 2023-07-14: qty 1

## 2023-07-14 MED ORDER — PANTOPRAZOLE SODIUM 20 MG PO TBEC: 20.0000 mg | DELAYED_RELEASE_TABLET | Freq: Every day | ORAL | 0 refills | Status: AC

## 2023-07-14 MED ORDER — LIDOCAINE 5 % EX PTCH
1.0000 | MEDICATED_PATCH | CUTANEOUS | Status: DC
  Administered 2023-07-14: 1 via TRANSDERMAL
  Filled 2023-07-14: qty 1

## 2023-07-14 NOTE — ED Provider Notes (Signed)
[place] EMERGENCY DEPARTMENT AT [facility] Provider Note   CSN: [id number] Arrival date & time: 07/14/23  1808     History  Chief Complaint  Patient presents with   Chest Pain    Victoria Maddox is a 48 y.o. female with a history of hypertension, breast cancer, hypertriglyceridemia, and GERD who presents to the ED today for multiple concerns.  Patient reports cough, indigestion, and chest pain that all started several hours prior to arrival.  Patient reports that the chest pain is located in the left side of the chest wall and is intermittent.  Pain does not radiate to the back.  Denies any aggravating or alleviating factors.  She reports that she is had similar chest pain about a week ago that resolved on its own without intervention.   Additionally, patient reports that she took famotidine earlier for her GERD with some relief.  No associated fever, sore throat, shortness of breath, or abdominal pain.  Patient reports that she has never pain like this before.  No other concerns or complaints at this time.    Home Medications Prior to Admission medications   Medication Sig Start Date End Date Taking? Authorizing Provider  pantoprazole (PROTONIX) 20 MG tablet Take 1 tablet (20 mg total) by mouth daily for 14 days. 07/14/23 07/28/23 Yes Maxwell Marion, PA-C  buPROPion (WELLBUTRIN XL) 150 MG 24 hr tablet Take 1 tablet (150 mg total) by mouth daily. 06/14/23   Ardith Dark, MD  diphenhydrAMINE (BENADRYL) 25 mg capsule Take 25 mg by mouth at bedtime as needed.    [provider]  hydrochlorothiazide (HYDRODIURIL) 25 MG tablet Take by mouth. 11/03/12   [provider]  tamoxifen (NOLVADEX) 20 MG tablet Take 1 tablet (20 mg) by mouth daily. 04/27/23   Serena Croissant, MD  tiZANidine (ZANAFLEX) 2 MG tablet Take 1-2 tablets (2-4 mg total) by mouth every 8 (eight) hours as needed for muscle spasms (Or for Migraine pain.). 02/04/23   Dulce Sellar, NP  verapamil  (CALAN-SR) 180 MG CR tablet Take 2 tablets (360 mg total) by mouth every morning. 12/26/21 12/26/22  Ardith Dark, MD      Allergies    Trazodone and nefazodone and Zoloft [sertraline hcl]    Review of Systems   Review of Systems  Cardiovascular:  Positive for chest pain.  All other systems reviewed and are negative.   Physical Exam Updated Vital Signs BP (!) 174/92   Pulse 70   Temp 97.9 F (36.6 C) (Temporal)   Resp 16   Ht 5\' 3"  (1.6 m)   Wt 64.9 kg   LMP  (LMP Unknown)   SpO2 99%   BMI 25.33 kg/m  Physical Exam Vitals and nursing note reviewed.  Constitutional:      General: She is not in acute distress.    Appearance: Normal appearance.  HENT:     Head: Normocephalic and atraumatic.     Mouth/Throat:     Mouth: Mucous membranes are moist.  Eyes:     Conjunctiva/sclera: Conjunctivae normal.     Pupils: Pupils are equal, round, and reactive to light.  Cardiovascular:     Rate and Rhythm: Normal rate and regular rhythm.     Pulses: Normal pulses.     Heart sounds: Normal heart sounds.  Pulmonary:     Effort: Pulmonary effort is normal.     Breath sounds: Normal breath sounds.  Abdominal:     Palpations: Abdomen is soft.  Tenderness: There is no abdominal tenderness.  Skin:    General: Skin is warm and dry.     Findings: No rash.  Neurological:     General: No focal deficit present.     Mental Status: She is alert.     Sensory: No sensory deficit.     Motor: No weakness.  Psychiatric:        Mood and Affect: Mood normal.        Behavior: Behavior normal.     ED Results / Procedures / Treatments   Labs (all labs ordered are listed, but only abnormal results are displayed) Labs Reviewed  BASIC METABOLIC PANEL - Abnormal; Notable for the following components:      Result Value   Glucose, Bld 101 (*)    All other components within normal limits  CBC - Abnormal; Notable for the following components:   WBC 3.5 (*)    MCV 79.9 (*)    All other  components within normal limits  HCG, SERUM, QUALITATIVE  TROPONIN I (HIGH SENSITIVITY)  TROPONIN I (HIGH SENSITIVITY)    EKG None  Radiology DG Chest 2 View  Result Date: 07/14/2023 CLINICAL DATA:  Chest pain, chronic cough EXAM: CHEST - 2 VIEW COMPARISON:  01/21/2021 FINDINGS: The heart size and mediastinal contours are within normal limits. Both lungs are clear. The visualized skeletal structures are unremarkable. IMPRESSION: No active cardiopulmonary disease. Electronically Signed   By: Helyn Numbers M.D.   On: 07/14/2023 19:22    Procedures Procedures: not indicated.   Medications Ordered in ED Medications  lidocaine (LIDODERM) 5 % 1 patch (1 patch Transdermal Patch Applied 07/14/23 2035)  pantoprazole (PROTONIX) EC tablet 40 mg (40 mg Oral Given 07/14/23 2032)  ketorolac (TORADOL) injection 30 mg (30 mg Intramuscular Given 07/14/23 2034)    ED Course/ Medical Decision Making/ A&P                                 Medical Decision Making Amount and/or Complexity of Data Reviewed Labs: ordered. Radiology: ordered.  Risk Prescription drug management.   This patient presents to the ED for concern of chest pain, this involves an extensive number of treatment options, and is a complaint that carries with it a high risk of complications and morbidity.   Differential diagnosis includes: ACS, GERD, pneumonia, pneumothorax, pleural effusion, costochondritis, pleurisy, muscle strain, GERD, etc.   Comorbidities  See HPI above   Additional History  Additional history obtained from patient's previous records.   Cardiac Monitoring / EKG  The patient was maintained on a cardiac monitor.  I personally viewed and interpreted the cardiac monitored which showed: NSR with a heart rate of 68 bpm.   Lab Tests  I ordered and personally interpreted labs.  The pertinent results include:   Negative troponin Negative pregnancy test BMP and CBC are within normal limits - no acute AKI,  electrolyte abnormality, infection, or anemia.   Imaging Studies  I ordered imaging studies including CXR  I independently visualized and interpreted imaging which showed: no active cardiopulmonary disease I agree with the radiologist interpretation   Problem List / ED Course / Critical Interventions / Medication Management  Chest pain, indigestion, and cough I ordered medications including: Ketorolac and lidocaine patch for chest pain Pantoprazole for indigestion Medications given prior to discharge I have reviewed the patients home medicines and have made adjustments as needed. When discussing findings with patient,  she said that since she was feeling better she did not want to wait for a second troponin. Discussed pros and cons about getting it and she decided that she would rather go home since she was not currently having chest pain. She agreed to follow up with cardiology outpatient for reevaluation and to return if pain worsens in the interim.   Social Determinants of Health  Access to healthcare   Test / Admission - Considered  Discussed results with patient husband at bedside. Prescription for pantoprazole sent to the pharmacy.  Advised patient that she can try OTC lidocaine patches as well. Ambulatory referral to cardiology provided for patient to follow-up with. Return precautions provided.       Final Clinical Impression(s) / ED Diagnoses Final diagnoses:  Atypical chest pain    Rx / DC Orders ED Discharge Orders          Ordered    pantoprazole (PROTONIX) 20 MG tablet  Daily        07/14/23 2033    Ambulatory referral to Cardiology        07/14/23 2034              Maxwell Marion, PA-C 07/14/23 2244    Cathren Laine, MD 07/15/23 1747

## 2023-07-14 NOTE — ED Triage Notes (Signed)
Pt arrives to ED with c/o chest pain, indigestion, and coughing.

## 2023-07-14 NOTE — Discharge Instructions (Addendum)
As discussed, your imaging and labs are unremarkable. I have sent a referral to cardiology and they will call you in the next couple days to schedule a follow up appointment for further evaluation of your chest pain.  Monitor your blood pressure at home in the next couple days to make sure it is within your normal limits.  You can take ibuprofen as needed for pain or try over-the-counter lidocaine patches.  Have sent a prescription of pantoprazole to your pharmacy.  Take this once a day for the next 14 days to help with your indigestion.  Follow-up with your PCP in the next 3 to 5 days for reevaluation of your symptoms.  Get help right away if: Your chest pain is worse. You have a cough that gets worse, or you cough up blood. You have very bad (severe) pain in your belly (abdomen). You pass out (faint). You have either of these for no clear reason: Sudden chest discomfort. Sudden discomfort in your arms, back, neck, or jaw. You have shortness of breath at any time. You suddenly start to sweat, or your skin gets clammy. You feel sick to your stomach (nauseous). You throw up (vomit). You suddenly feel lightheaded or dizzy. You feel very weak or tired. Your heart starts to beat fast, or it feels like it is skipping beats.

## 2023-08-02 DIAGNOSIS — L811 Chloasma: Secondary | ICD-10-CM | POA: Diagnosis not present

## 2023-08-05 ENCOUNTER — Encounter: Payer: 59 | Admitting: Family Medicine

## 2023-08-11 ENCOUNTER — Other Ambulatory Visit (HOSPITAL_COMMUNITY): Payer: Self-pay

## 2023-08-11 ENCOUNTER — Other Ambulatory Visit: Payer: Self-pay | Admitting: Family Medicine

## 2023-08-11 DIAGNOSIS — I10 Essential (primary) hypertension: Secondary | ICD-10-CM

## 2023-08-12 ENCOUNTER — Other Ambulatory Visit (HOSPITAL_COMMUNITY): Payer: Self-pay

## 2023-08-12 ENCOUNTER — Other Ambulatory Visit: Payer: Self-pay

## 2023-08-12 MED ORDER — VERAPAMIL HCL ER 180 MG PO TBCR
360.0000 mg | EXTENDED_RELEASE_TABLET | Freq: Every morning | ORAL | 3 refills | Status: DC
  Filled 2023-08-12: qty 180, 90d supply, fill #0
  Filled 2024-01-26: qty 180, 90d supply, fill #1
  Filled 2024-04-26: qty 180, 90d supply, fill #2

## 2023-08-26 ENCOUNTER — Other Ambulatory Visit (HOSPITAL_BASED_OUTPATIENT_CLINIC_OR_DEPARTMENT_OTHER): Payer: Self-pay

## 2023-08-26 MED ORDER — FLULAVAL 0.5 ML IM SUSY
0.5000 mL | PREFILLED_SYRINGE | Freq: Once | INTRAMUSCULAR | 0 refills | Status: AC
  Filled 2023-08-26: qty 0.5, 1d supply, fill #0

## 2023-10-01 ENCOUNTER — Ambulatory Visit: Payer: 59 | Admitting: Cardiovascular Disease

## 2023-10-04 ENCOUNTER — Ambulatory Visit: Payer: 59 | Attending: Cardiovascular Disease | Admitting: Cardiovascular Disease

## 2023-10-05 ENCOUNTER — Encounter: Payer: Self-pay | Admitting: Cardiovascular Disease

## 2023-11-15 DIAGNOSIS — L811 Chloasma: Secondary | ICD-10-CM | POA: Diagnosis not present

## 2023-11-25 ENCOUNTER — Telehealth: Payer: Self-pay | Admitting: Family Medicine

## 2023-11-25 NOTE — Telephone Encounter (Signed)
Patient is request lab orders for a tb blood test she has a appt schedule for Monday at the [facility]

## 2023-11-26 ENCOUNTER — Other Ambulatory Visit: Payer: Self-pay | Admitting: *Deleted

## 2023-11-26 DIAGNOSIS — Z111 Encounter for screening for respiratory tuberculosis: Secondary | ICD-10-CM

## 2023-11-26 NOTE — Telephone Encounter (Signed)
Lab order placed.

## 2023-11-26 NOTE — Telephone Encounter (Signed)
Ok to order quantiferon gold.  Katina Degree. Jimmey Ralph, MD 11/26/2023 1:26 PM

## 2023-11-29 ENCOUNTER — Telehealth: Payer: Self-pay | Admitting: Family Medicine

## 2023-11-29 ENCOUNTER — Other Ambulatory Visit: Payer: Commercial Managed Care - PPO

## 2023-11-29 DIAGNOSIS — Z111 Encounter for screening for respiratory tuberculosis: Secondary | ICD-10-CM | POA: Diagnosis not present

## 2023-11-29 NOTE — Telephone Encounter (Signed)
Patient dropped off document  Nursing School Form , to be filled out by provider. Patient requested to send it back via Call Patient to pick up within ASAP. Document is located in providers tray at front office.Please advise at Mobile [phone] (mobile)

## 2023-11-30 NOTE — Telephone Encounter (Signed)
Placed to be reviewed in PCP office

## 2023-12-02 ENCOUNTER — Encounter: Payer: Self-pay | Admitting: Family Medicine

## 2023-12-02 LAB — QUANTIFERON-TB GOLD PLUS
Mitogen-NIL: >10 [IU]/mL
NIL: 0.03 [IU]/mL
QuantiFERON-TB Gold Plus: NEGATIVE
TB1-NIL: 0 [IU]/mL
TB2-NIL: 0 [IU]/mL

## 2023-12-02 NOTE — Progress Notes (Signed)
TB skin test is negative.

## 2023-12-03 NOTE — Telephone Encounter (Signed)
Copied from CRM [id number]. 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 She would like a phone call if it is ready to be picked up.  Returned patient phone call; no answer left VM for call back for clarification.

## 2023-12-06 NOTE — Telephone Encounter (Signed)
LVM  Form ready to be pickup, placed at front office  Copy placed to be scan in patient chart

## 2024-01-19 ENCOUNTER — Other Ambulatory Visit (HOSPITAL_BASED_OUTPATIENT_CLINIC_OR_DEPARTMENT_OTHER): Payer: Self-pay

## 2024-01-26 ENCOUNTER — Other Ambulatory Visit (HOSPITAL_COMMUNITY): Payer: Self-pay

## 2024-01-26 ENCOUNTER — Other Ambulatory Visit: Payer: Self-pay

## 2024-04-26 ENCOUNTER — Other Ambulatory Visit (HOSPITAL_COMMUNITY): Payer: Self-pay

## 2024-04-26 ENCOUNTER — Encounter: Payer: Self-pay | Admitting: Family Medicine

## 2024-04-26 ENCOUNTER — Ambulatory Visit: Admitting: Family Medicine

## 2024-04-26 ENCOUNTER — Other Ambulatory Visit: Payer: Self-pay

## 2024-04-26 ENCOUNTER — Ambulatory Visit

## 2024-04-26 VITALS — BP 145/77 | HR 62 | Temp 97.5°F | Ht 63.0 in | Wt 139.0 lb

## 2024-04-26 DIAGNOSIS — K5900 Constipation, unspecified: Secondary | ICD-10-CM

## 2024-04-26 DIAGNOSIS — R7303 Prediabetes: Secondary | ICD-10-CM

## 2024-04-26 DIAGNOSIS — K2100 Gastro-esophageal reflux disease with esophagitis, without bleeding: Secondary | ICD-10-CM

## 2024-04-26 DIAGNOSIS — I10 Essential (primary) hypertension: Secondary | ICD-10-CM | POA: Diagnosis not present

## 2024-04-26 DIAGNOSIS — R140 Abdominal distension (gaseous): Secondary | ICD-10-CM | POA: Diagnosis not present

## 2024-04-26 LAB — CBC
HCT: 36.5 % (ref 36.0–46.0)
Hemoglobin: 12 g/dL (ref 12.0–15.0)
MCHC: 32.9 g/dL (ref 30.0–36.0)
MCV: 80.2 fl (ref 78.0–100.0)
Platelets: 268 10*3/uL (ref 150.0–400.0)
RBC: 4.55 Mil/uL (ref 3.87–5.11)
RDW: 14.5 % (ref 11.5–15.5)
WBC: 3.4 10*3/uL — ABNORMAL LOW (ref 4.0–10.5)

## 2024-04-26 LAB — COMPREHENSIVE METABOLIC PANEL WITH GFR
ALT: 17 U/L (ref 0–35)
AST: 18 U/L (ref 0–37)
Albumin: 3.9 g/dL (ref 3.5–5.2)
Alkaline Phosphatase: 87 U/L (ref 39–117)
BUN: 13 mg/dL (ref 6–23)
CO2: 32 meq/L (ref 19–32)
Calcium: 9.1 mg/dL (ref 8.4–10.5)
Chloride: 105 meq/L (ref 96–112)
Creatinine, Ser: 0.72 mg/dL (ref 0.40–1.20)
GFR: 99.36 mL/min (ref >60.00)
Glucose, Bld: 99 mg/dL (ref 70–99)
Potassium: 4 meq/L (ref 3.5–5.1)
Sodium: 141 meq/L (ref 135–145)
Total Bilirubin: 0.2 mg/dL (ref 0.2–1.2)
Total Protein: 7.2 g/dL (ref 6.0–8.3)

## 2024-04-26 LAB — HEMOGLOBIN A1C: Hgb A1c MFr Bld: 6.1 % (ref 4.6–6.5)

## 2024-04-26 LAB — TSH: TSH: 2.62 u[IU]/mL (ref 0.35–5.50)

## 2024-04-26 MED ORDER — HYDROCHLOROTHIAZIDE 12.5 MG PO TABS
12.5000 mg | ORAL_TABLET | Freq: Every day | ORAL | 3 refills | Status: AC
  Filled 2024-04-26: qty 90, 90d supply, fill #0
  Filled 2024-04-26 – 2024-11-15 (×3): qty 90, 90d supply, fill #1

## 2024-04-26 NOTE — Assessment & Plan Note (Signed)
 Check A1c.

## 2024-04-26 NOTE — Progress Notes (Signed)
   Victoria Maddox is a 48 y.o. female who presents today for an office visit.  Assessment/Plan:  New/Acute Problems: Bloating/constipation No red flags.  Overall reassuring exam.  Her bloating sensation is likely due to chronic and ongoing constipation.  We will start bowel regimen with MiraLAX daily with goal of having 1-2 soft bowel movements daily.  Will also check labs including CBC, c-Met, TSH.  Also check KUB.  She will follow-up with us  in 1 to 2 weeks.  If no improvement would consider referral to GI.  Chronic Problems Addressed Today: HTN (hypertension) Mildly elevated today.  She has had a few persistent elevations with oncology as well.  She is doing well with verapamil  360 mg daily.  Will restart HCTZ at 12.5 mg daily.  She has done well with this previously.  She will monitor her blood pressure at home and follow-up with us  in a couple weeks.  Prediabetes Check A1c.  Preventative Healthcare Due for screening colonoscopy.  Will place referral today.    Subjective:  HPI:  See Assessment / plan for status of chronic conditions. Patient here today with chief complaint abdominal bloating.  This has been going on for over a month. She is having some mild constipation as well. She has had other people ask if she is pregnant after eating. She has tried gas-X which has not helped. No nausea or vomiting. She is having a bowel movement about every other days. Some blood in stool occasionally.   She is sometimes getting some reflux. Some weight gain. No fevers or chills.        Objective:  Physical Exam: BP (!) 145/77   Pulse 62   Temp (!) 97.5 F (36.4 C) (Temporal)   Ht 5' 3 (1.6 m)   Wt 139 lb (63 kg)   LMP  (LMP Unknown)   SpO2 100%   BMI 24.62 kg/m   Wt Readings from Last 3 Encounters:  04/26/24 139 lb (63 kg)  07/14/23 143 lb (64.9 kg)  06/22/23 143 lb (64.9 kg)    Gen: No acute distress, resting comfortably CV: Regular rate and rhythm with no murmurs  appreciated Pulm: Normal work of breathing, clear to auscultation bilaterally with no crackles, wheezes, or rhonchi Abdomen: No deformities.  Soft, nondistended, bowel sounds present.  Mild tenderness to palpation in epigastric area. Neuro: Grossly normal, moves all extremities Psych: Normal affect and thought content      Victoria Apollo M. Daneil Dunker, MD 04/26/2024 9:36 AM

## 2024-04-26 NOTE — Assessment & Plan Note (Signed)
 Mildly elevated today.  She has had a few persistent elevations with oncology as well.  She is doing well with verapamil  360 mg daily.  Will restart HCTZ at 12.5 mg daily.  She has done well with this previously.  She will monitor her blood pressure at home and follow-up with us  in a couple weeks.

## 2024-04-26 NOTE — Patient Instructions (Signed)
 It was very nice to see you today!  We need to work on getting your constipation cleared up.  Please take MiraLAX daily for the next week or 2.  The goal is to have 1-2 soft bowel movements daily.  We will check blood work today and also an x-ray.  We will restart her HCTZ at 12.5 mg daily.  Please follow-up with me in 1 to 2 weeks.  Return if symptoms worsen or fail to improve.   Take care, Dr Daneil Dunker  PLEASE NOTE:  If you had any lab tests, please let us  know if you have not heard back within a few days. You may see your results on mychart before we have a chance to review them but we will give you a call once they are reviewed by us .   If we ordered any referrals today, please let us  know if you have not heard from their office within the next week.   If you had any urgent prescriptions sent in today, please check with the pharmacy within an hour of our visit to make sure the prescription was transmitted appropriately.   Please try these tips to maintain a healthy lifestyle:  Eat at least 3 REAL meals and 1-2 snacks per day.  Aim for no more than 5 hours between eating.  If you eat breakfast, please do so within one hour of getting up.   Each meal should contain half fruits/vegetables, one quarter protein, and one quarter carbs (no bigger than a computer mouse)  Cut down on sweet beverages. This includes juice, soda, and sweet tea.   Drink at least 1 glass of water with each meal and aim for at least 8 glasses per day  Exercise at least 150 minutes every week.

## 2024-04-27 ENCOUNTER — Other Ambulatory Visit: Payer: Self-pay | Admitting: Hematology and Oncology

## 2024-04-27 ENCOUNTER — Other Ambulatory Visit (HOSPITAL_COMMUNITY): Payer: Self-pay

## 2024-04-27 ENCOUNTER — Other Ambulatory Visit: Payer: Self-pay

## 2024-04-27 ENCOUNTER — Ambulatory Visit: Payer: Self-pay | Admitting: Family Medicine

## 2024-04-27 DIAGNOSIS — R718 Other abnormality of red blood cells: Secondary | ICD-10-CM

## 2024-04-27 MED ORDER — TAMOXIFEN CITRATE 20 MG PO TABS
20.0000 mg | ORAL_TABLET | Freq: Every day | ORAL | 3 refills | Status: AC
  Filled 2024-04-27: qty 90, 90d supply, fill #0
  Filled 2024-11-13: qty 90, 90d supply, fill #1

## 2024-04-27 NOTE — Progress Notes (Signed)
 Red blood cell count is mildly decreased but stable over the last year.  Her hemoglobin is also borderline low.  Recommend she come back in 1 month to recheck.  Please place future order for CBC with differential, iron panel, and B12.  Her A1c is borderline but stable compared to the last couple years.  She should continue to work on diet and exercise we can recheck this in 6 to 12 months.  The rest of her labs are all in goal.  Will contact her once we have her results back on her x-ray.

## 2024-05-08 NOTE — Progress Notes (Signed)
 Her x-ray shows a large amount of constipation.  No other significant findings on her x-ray.  As we discussed at her office visit, recommend that she continue MiraLAX daily with a goal of having 1-2 soft bowel movements daily.  She should let us  know if her constipation and abdominal bloating are not improving with this.

## 2024-05-25 ENCOUNTER — Telehealth: Payer: Self-pay | Admitting: Hematology and Oncology

## 2024-05-25 NOTE — Telephone Encounter (Signed)
 Spoke with patient confirming upcoming appointment

## 2024-06-15 ENCOUNTER — Encounter: Payer: Self-pay | Admitting: Family Medicine

## 2024-06-16 NOTE — Telephone Encounter (Signed)
Forwarding to Dr. Parker as FYI.  

## 2024-06-19 ENCOUNTER — Other Ambulatory Visit (HOSPITAL_COMMUNITY): Payer: Self-pay

## 2024-06-19 ENCOUNTER — Telehealth: Payer: Self-pay | Admitting: Family Medicine

## 2024-06-19 ENCOUNTER — Ambulatory Visit
Admission: RE | Admit: 2024-06-19 | Discharge: 2024-06-19 | Disposition: A | Discharge status: Home / Self Care | Source: Ambulatory Visit | Attending: Hematology and Oncology

## 2024-06-19 ENCOUNTER — Other Ambulatory Visit: Payer: Self-pay

## 2024-06-19 ENCOUNTER — Ambulatory Visit (INDEPENDENT_AMBULATORY_CARE_PROVIDER_SITE_OTHER): Payer: 59 | Admitting: Family Medicine

## 2024-06-19 VITALS — BP 128/70 | HR 69 | Temp 97.5°F | Ht 63.0 in | Wt 144.4 lb

## 2024-06-19 DIAGNOSIS — R7303 Prediabetes: Secondary | ICD-10-CM

## 2024-06-19 DIAGNOSIS — I10 Essential (primary) hypertension: Secondary | ICD-10-CM | POA: Diagnosis not present

## 2024-06-19 DIAGNOSIS — Z1211 Encounter for screening for malignant neoplasm of colon: Secondary | ICD-10-CM

## 2024-06-19 DIAGNOSIS — Z0001 Encounter for general adult medical examination with abnormal findings: Secondary | ICD-10-CM

## 2024-06-19 DIAGNOSIS — K5900 Constipation, unspecified: Secondary | ICD-10-CM | POA: Insufficient documentation

## 2024-06-19 DIAGNOSIS — R928 Other abnormal and inconclusive findings on diagnostic imaging of breast: Secondary | ICD-10-CM | POA: Diagnosis not present

## 2024-06-19 DIAGNOSIS — F4323 Adjustment disorder with mixed anxiety and depressed mood: Secondary | ICD-10-CM

## 2024-06-19 DIAGNOSIS — D0512 Intraductal carcinoma in situ of left breast: Secondary | ICD-10-CM

## 2024-06-19 MED ORDER — LUBIPROSTONE 24 MCG PO CAPS
24.0000 ug | ORAL_CAPSULE | Freq: Two times a day (BID) | ORAL | 5 refills | Status: AC
  Filled 2024-06-19: qty 60, 30d supply, fill #0
  Filled 2024-11-13: qty 180, 90d supply, fill #1

## 2024-06-19 NOTE — Assessment & Plan Note (Signed)
 Blood pressure at goal today on HCTZ 12.5 mg daily, verapamil  360 mg daily.

## 2024-06-19 NOTE — Assessment & Plan Note (Signed)
 Last A1c 6.1 a few months ago.  Too early to recheck today.  She is working on lifestyle interventions.  Recheck next physical or office visit.

## 2024-06-19 NOTE — Assessment & Plan Note (Signed)
 Mood well-controlled on Wellbutrin  150 mg daily.

## 2024-06-19 NOTE — Patient Instructions (Signed)
 It was very nice to see you today!  VISIT SUMMARY: Today, you had your annual physical exam. We discussed your ongoing issues with constipation and abdominal bloating, as well as your recent weight gain. We also reviewed your diet and exercise habits.  YOUR PLAN: CONSTIPATION WITH ABDOMINAL BLOATING: You have been experiencing chronic constipation and bloating despite using Miralax daily. -Start taking Amitiza , one tablet per day, to help with your constipation. -You are due for a colonoscopy screening. We will refer you to a gastroenterologist for this procedure. -Follow up in a couple of weeks to see how you are responding to Amitiza .  OBESITY: You have gained about 15 pounds over the past few years, mainly in your abdominal area. This may be related to your constipation and bloating. -Try to reduce your sugar intake gradually, especially in your tea. -Incorporate more fruits and vegetables into your diet. -Engage in regular physical activity as much as your schedule allows.  GENERAL HEALTH MAINTENANCE: We discussed your overall health, focusing on diet and exercise. -Continue to eat a balanced diet rich in fruits and vegetables. -Gradually reduce your sugar intake. -Engage in regular physical activity as much as possible.  Return in about 1 year (around 06/19/2025) for Annual Physical.   Take care, Dr Kennyth  PLEASE NOTE:  If you had any lab tests, please let us  know if you have not heard back within a few days. You may see your results on mychart before we have a chance to review them but we will give you a call once they are reviewed by us .   If we ordered any referrals today, please let us  know if you have not heard from their office within the next week.   If you had any urgent prescriptions sent in today, please check with the pharmacy within an hour of our visit to make sure the prescription was transmitted appropriately.   Please try these tips to maintain a healthy  lifestyle:  Eat at least 3 REAL meals and 1-2 snacks per day.  Aim for no more than 5 hours between eating.  If you eat breakfast, please do so within one hour of getting up.   Each meal should contain half fruits/vegetables, one quarter protein, and one quarter carbs (no bigger than a computer mouse)  Cut down on sweet beverages. This includes juice, soda, and sweet tea.   Drink at least 1 glass of water with each meal and aim for at least 8 glasses per day  Exercise at least 150 minutes every week.

## 2024-06-19 NOTE — Telephone Encounter (Signed)
 Ok to schedule NP appt with Dr Kennyth

## 2024-06-19 NOTE — Progress Notes (Signed)
 Chief Complaint:  Victoria Maddox is a 48 y.o. female who presents today for her annual comprehensive physical exam.    Assessment/Plan:  Chronic Problems Addressed Today: Prediabetes Last A1c 6.1 a few months ago.  Too early to recheck today.  She is working on lifestyle interventions.  Recheck next physical or office visit.  HTN (hypertension) Blood pressure at goal today on HCTZ 12.5 mg daily, verapamil  360 mg daily.  Adjustment disorder with mixed anxiety and depressed mood Mood well-controlled on Wellbutrin  150 mg daily.  Constipation She had modest improvement with MiraLAX though still having ongoing issues with constipation and abdominal bloating.  Will try Amitiza  and refer to GI.  Recent labs were normal.   Preventative Healthcare: Due for mammogram later today.  Will refer for colonoscopy.  Recently had labs-did not need to recheck today.  Patient Counseling(The following topics were reviewed and/or handout was given):  -Nutrition: Stressed importance of moderation in sodium/caffeine intake, saturated fat and cholesterol, caloric balance, sufficient intake of fresh fruits, vegetables, and fiber.  -Stressed the importance of regular exercise.   -Substance Abuse: Discussed cessation/primary prevention of tobacco, alcohol, or other drug use; driving or other dangerous activities under the influence; availability of treatment for abuse.   -Injury prevention: Discussed safety belts, safety helmets, smoke detector, smoking near bedding or upholstery.   -Sexuality: Discussed sexually transmitted diseases, partner selection, use of condoms, avoidance of unintended pregnancy and contraceptive alternatives.   -Dental health: Discussed importance of regular tooth brushing, flossing, and dental visits.  -Health maintenance and immunizations reviewed. Please refer to Health maintenance section.  Return to care in 1 year for next preventative visit.     Subjective:  HPI:  She has  no acute complaints today. Patient is here today for his annual physical.  See assessment / plan for status of chronic conditions.  Patient is here today for her annual physical.  I saw her last about 2 months ago for abdominal bloating and constipation.  We started her on a bowel regimen including MiraLAX.  We also checked plain film and labs at that visit.  Her x-ray showed large stool burden.  Labs were reassuring.  Patient does report that her constipation has improved however still having significant bloating all the time.  Discussed the use of AI scribe software for clinical note transcription with the patient, who gave verbal consent to proceed.  History of Present Illness Victoria Maddox is a 48 year old female who presents for her annual physical exam.  She has been experiencing ongoing abdominal bloating and constipation. Two months ago, she was started on a bowel regimen including Miralax after imaging showed a large stool burden, and labs were reassuring. Her constipation has improved slightly, but she continues to experience significant bloating and gas, describing the sensation as feeling 'very bloated.'  She uses Miralax daily, which has increased her bowel movements but not to the extent of feeling fully relieved. She wants a more effective solution to 'flush' her system as she does not feel her stomach is empty after bowel movements.  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 She enjoys salads and fruits but struggles with sugar intake, particularly in her tea. She has been attempting to reduce her sugar consumption gradually.   Lifestyle Diet: Trying to be careful with diet. Trying to get more fruits and vegetables.  Exercise: None specific.  04/26/2024    9:39 AM  Depression screen PHQ 2/9  Decreased Interest 1  Down, Depressed, Hopeless 1  PHQ - 2 Score 2  Altered sleeping 2  Tired, decreased  energy 3  Change in appetite 1  Feeling bad or failure about yourself  1  Trouble concentrating 0  Moving slowly or fidgety/restless 0  Suicidal thoughts 0  PHQ-9 Score 9  Difficult doing work/chores Somewhat difficult    Health Maintenance Due  Topic Date Due   Hepatitis B Vaccines (1 of 3 - 19+ 3-dose series) Never done   Colonoscopy  Never done   MAMMOGRAM  06/15/2024     ROS: Per HPI, otherwise a complete review of systems was negative.   PMH:  The following were reviewed and entered/updated in epic: Past Medical History:  Diagnosis Date   Allergy    Depression    Family history of breast cancer    Family history of kidney cancer    Family history of throat cancer    Frequent headaches    Heart murmur    History of recurrent UTIs    Hypertension    Migraines    Personal history of radiation therapy    Urinary incontinence    Patient Active Problem List   Diagnosis Date Noted   Constipation 06/19/2024   Cervical radiculopathy 03/26/2022   Migraine 12/26/2021   Prediabetes 07/08/2021   Shoulder pain, bilateral 07/08/2021   Hypertriglyceridemia 07/08/2021   GERD (gastroesophageal reflux disease) 07/08/2021   Upper back pain 07/08/2021   Genetic testing 02/22/2020   Family history of breast cancer    Family history of throat cancer    Family history of kidney cancer    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   HTN (hypertension) 05/04/2018   Insomnia 05/04/2018   Adjustment disorder with mixed anxiety and depressed mood 05/04/2018   Past Surgical History:  Procedure Laterality Date   BREAST BIOPSY Left 01/29/2020   BREAST BIOPSY Left 02/06/2020   BREAST LUMPECTOMY Left 03/12/2020   BREAST LUMPECTOMY WITH RADIOACTIVE SEED LOCALIZATION Left 03/12/2020   Procedure: LEFT BREAST LUMPECTOMY WITH BRACKETED RADIOACTIVE SEED LOCALIZATION;  Surgeon: Ebbie Cough, MD;  Location: [facility];  Service: General;  Laterality: Left;    DILATION AND CURETTAGE OF UTERUS      Family History  Problem Relation Age of Onset   Heart attack Mother    Heart disease Mother    Hypertension Mother    Early death Father        unknown cause   Hypertension Father    Hypertension Sister    Diabetes Brother    Hypertension Brother    Asthma Sister    Hypertension Sister    Hyperlipidemia Brother    Hypertension Brother    Mental illness Brother    Heart disease Sister    Hypertension Sister    Arthritis Brother    Diabetes Brother    Hypertension Brother    Breast cancer Cousin 43       paternal first cousin   Throat cancer Sister 82       non-smoker   Breast cancer Cousin        dx. in her 30s, paternal first cousin once removed   Kidney cancer Other 48    Medications- reviewed and updated Current Outpatient Medications  Medication Sig Dispense Refill   buPROPion  (WELLBUTRIN  XL) 150 MG 24 hr tablet Take 1 tablet (150 mg total) by mouth  daily. 90 tablet 3   diphenhydrAMINE  (BENADRYL ) 25 mg capsule Take 25 mg by mouth at bedtime as needed.     hydrochlorothiazide  (HYDRODIURIL ) 12.5 MG tablet Take 1 tablet (12.5 mg total) by mouth daily. 90 tablet 3   lubiprostone  (AMITIZA ) 24 MCG capsule Take 1 capsule (24 mcg total) by mouth 2 (two) times daily with a meal. 60 capsule 5   pantoprazole  (PROTONIX ) 20 MG tablet Take 1 tablet (20 mg total) by mouth daily for 14 days. 14 tablet 0   tamoxifen  (NOLVADEX ) 20 MG tablet Take 1 tablet (20 mg) by mouth daily. 90 tablet 3   tiZANidine  (ZANAFLEX ) 2 MG tablet Take 1-2 tablets (2-4 mg total) by mouth every 8 (eight) hours as needed for muscle spasms (Or for Migraine pain.). 60 tablet 1   verapamil  (CALAN -SR) 180 MG CR tablet Take 2 tablets (360 mg total) by mouth every morning. 180 tablet 3   No current facility-administered medications for this visit.    Allergies-reviewed and updated Allergies  Allergen Reactions   Trazodone  And Nefazodone     Dizziness   Zoloft   [Sertraline  Hcl]     Anxiousness, lash out at kids    Social History   Socioeconomic History   Marital status: Married    Spouse name: Not on file   Number of children: Not on file   Years of education: Not on file   Highest education level: Not on file  Occupational History   Not on file  Tobacco Use   Smoking status: Never   Smokeless tobacco: Never  Vaping Use   Vaping status: Never Used  Substance and Sexual Activity   Alcohol use: Not Currently   Drug use: Never   Sexual activity: Yes    Partners: Male  Other Topics Concern   Not on file  Social History Narrative   Not on file   Social Drivers of Health   Financial Resource Strain: Not on file  Food Insecurity: Not on file  Transportation Needs: Not on file  Physical Activity: Not on file  Stress: Not on file  Social Connections: Not on file        Objective:  Physical Exam: BP 128/70   Pulse 69   Temp (!) 97.5 F (36.4 C) (Temporal)   Ht 5' 3 (1.6 m)   Wt 144 lb 6.4 oz (65.5 kg)   SpO2 98%   BMI 25.58 kg/m   Body mass index is 25.58 kg/m. Wt Readings from Last 3 Encounters:  06/19/24 144 lb 6.4 oz (65.5 kg)  04/26/24 139 lb (63 kg)  07/14/23 143 lb (64.9 kg)   Gen: NAD, resting comfortably HEENT: TMs normal bilaterally. OP clear. No thyromegaly noted.  CV: RRR with no murmurs appreciated Pulm: NWOB, CTAB with no crackles, wheezes, or rhonchi GI: Normal bowel sounds present. Soft, Nontender, Nondistended. MSK: no edema, cyanosis, or clubbing noted Skin: warm, dry Neuro: CN2-12 grossly intact. Strength 5/5 in upper and lower extremities. Reflexes symmetric and intact bilaterally.  Psych: Normal affect and thought content     Charmelle Soh M. Kennyth, MD 06/19/2024 10:33 AM

## 2024-06-19 NOTE — Assessment & Plan Note (Signed)
 She had modest improvement with MiraLAX though still having ongoing issues with constipation and abdominal bloating.  Will try Amitiza  and refer to GI.  Recent labs were normal.

## 2024-06-19 NOTE — Telephone Encounter (Signed)
 Patient stated at Check out that Dr. Kennyth would accept Patient's daughter as a New Patient. Please advise.

## 2024-06-20 NOTE — Telephone Encounter (Signed)
 When Patient calls, please transfer to front office.  LVM for Patient to call front desk-need daughter's name, DOB and phone#.

## 2024-06-21 ENCOUNTER — Ambulatory Visit: Payer: 59 | Admitting: Hematology and Oncology

## 2024-06-28 ENCOUNTER — Inpatient Hospital Stay: Payer: Self-pay | Attending: Hematology and Oncology | Admitting: Hematology and Oncology

## 2024-06-28 VITALS — BP 156/83 | HR 71 | Temp 97.7°F | Resp 16 | Wt 144.7 lb

## 2024-06-28 DIAGNOSIS — D0512 Intraductal carcinoma in situ of left breast: Secondary | ICD-10-CM | POA: Insufficient documentation

## 2024-06-28 DIAGNOSIS — Z170 Estrogen receptor positive status [ER+]: Secondary | ICD-10-CM | POA: Insufficient documentation

## 2024-06-28 DIAGNOSIS — Z923 Personal history of irradiation: Secondary | ICD-10-CM | POA: Insufficient documentation

## 2024-06-28 DIAGNOSIS — Z79810 Long term (current) use of selective estrogen receptor modulators (SERMs): Secondary | ICD-10-CM | POA: Diagnosis not present

## 2024-06-28 DIAGNOSIS — Z1721 Progesterone receptor positive status: Secondary | ICD-10-CM | POA: Diagnosis not present

## 2024-06-28 NOTE — Progress Notes (Signed)
 Patient Care Team: Kennyth Worth HERO, MD as PCP - General (Family Medicine) Ebbie Cough, MD as Consulting Physician (General Surgery) Odean Potts, MD as Consulting Physician (Hematology and Oncology) Dewey Rush, MD as Consulting Physician (Radiation Oncology) Barbette Knock, MD as Consulting Physician (Obstetrics and Gynecology) Kennyth Worth HERO, MD as Consulting Physician (Family Medicine)  DIAGNOSIS:  Encounter Diagnosis  Name Primary?   Ductal carcinoma in situ (DCIS) of left breast Yes    SUMMARY OF ONCOLOGIC HISTORY: Oncology History  Ductal carcinoma in situ (DCIS) of left breast  02/08/2020 Initial Diagnosis   Screening mammogram detected left breast calcifications, 2.9cm. Biopsy showed DCIS with calcifications, low grade, ER+ 70%, PR+ 90%.   02/20/2020 Genetic Testing   Negative genetic testing:  No pathogenic variants detected on the [facility] Breast Cancer STAT or Multi-Cancer Panels. The report date is 02/20/2020.  The Breast Cancer STAT Panel offered by [facility] includes sequencing and deletion/duplication analysis for the following 9 genes:  ATM, BRCA1, BRCA2, CDH1, CHEK2, PALB2, PTEN, STK11 and TP53.  The Multi-Cancer Panel offered by [facility] includes sequencing and/or deletion duplication testing of the following 85 genes: AIP, ALK, APC, ATM, AXIN2,BAP1,  BARD1, BLM, BMPR1A, BRCA1, BRCA2, BRIP1, CASR, CDC73, CDH1, CDK4, CDKN1B, CDKN1C, CDKN2A (p14ARF), CDKN2A (p16INK4a), CEBPA, CHEK2, CTNNA1, DICER1, DIS3L2, EGFR (c.2369C>T, p.Thr790Met variant only), EPCAM (Deletion/duplication testing only), FH, FLCN, GATA2, GPC3, GREM1 (Promoter region deletion/duplication testing only), HOXB13 (c.251G>A, p.Gly84Glu), HRAS, KIT, MAX, MEN1, MET, MITF (c.952G>A, p.Glu318Lys variant only), MLH1, MSH2, MSH3, MSH6, MUTYH, NBN, NF1, NF2, NTHL1, PALB2, PDGFRA, PHOX2B, PMS2, POLD1, POLE, POT1, PRKAR1A, PTCH1, PTEN, RAD50, RAD51C, RAD51D, RB1, RECQL4, RET, RNF43, RUNX1, SDHAF2, SDHA (sequence  changes only), SDHB, SDHC, SDHD, SMAD4, SMARCA4, SMARCB1, SMARCE1, STK11, SUFU, TERC, TERT, TMEM127, TP53, TSC1, TSC2, VHL, WRN and WT1.   03/12/2020 Surgery   Left lumpectomy Viktoria) [phone]): flat epithelial atypia with calcifications. Negative margins. No regional lymph nodes were examined. ER/PR +.   04/23/2020 - 06/07/2020 Radiation Therapy   The patient initially received a dose of 50.4 Gy in 28 fractions to the breast using whole-breast tangent fields. This was delivered using a 3-D conformal technique. The pt received a boost delivering an additional 10 Gy in 5 fractions using a electron boost with electrons. The total dose was 60.4 Gy.   06/2020 - 06/2025 Anti-estrogen oral therapy   Tamoxifen      CHIEF COMPLIANT:   HISTORY OF PRESENT ILLNESS: Discussed the use of AI scribe software for clinical note transcription with the patient, who gave verbal consent to proceed.  History of Present Illness      ALLERGIES:  is allergic to trazodone  and nefazodone and zoloft  [sertraline  hcl].  MEDICATIONS:  Current Outpatient Medications  Medication Sig Dispense Refill   buPROPion  (WELLBUTRIN  XL) 150 MG 24 hr tablet Take 1 tablet (150 mg total) by mouth daily. 90 tablet 3   diphenhydrAMINE  (BENADRYL ) 25 mg capsule Take 25 mg by mouth at bedtime as needed.     hydrochlorothiazide  (HYDRODIURIL ) 12.5 MG tablet Take 1 tablet (12.5 mg total) by mouth daily. 90 tablet 3   lubiprostone  (AMITIZA ) 24 MCG capsule Take 1 capsule (24 mcg total) by mouth 2 (two) times daily with a meal. 60 capsule 5   pantoprazole  (PROTONIX ) 20 MG tablet Take 1 tablet (20 mg total) by mouth daily for 14 days. 14 tablet 0   tamoxifen  (NOLVADEX ) 20 MG tablet Take 1 tablet (20 mg) by mouth daily. 90 tablet 3   tiZANidine  (ZANAFLEX ) 2 MG tablet  Take 1-2 tablets (2-4 mg total) by mouth every 8 (eight) hours as needed for muscle spasms (Or for Migraine pain.). 60 tablet 1   verapamil  (CALAN -SR) 180 MG CR tablet  Take 2 tablets (360 mg total) by mouth every morning. 180 tablet 3   No current facility-administered medications for this visit.    PHYSICAL EXAMINATION: ECOG PERFORMANCE STATUS: 1 - Symptomatic but completely ambulatory  Vitals:   06/28/24 0914  BP: (!) 156/83  Pulse: 71  Resp: 16  Temp: 97.7 F (36.5 C)  SpO2: 100%   Filed Weights   06/28/24 0914  Weight: 144 lb 11.2 oz (65.6 kg)    Physical Exam   (exam performed in the presence of a chaperone)  LABORATORY DATA:  I have reviewed the data as listed    Latest Ref Rng & Units 04/26/2024    9:51 AM 07/14/2023    6:28 PM 06/14/2023   10:55 AM  CMP  Glucose 70 - 99 mg/dL 99  898  891   BUN 6 - 23 mg/dL 13  13  11    Creatinine 0.40 - 1.20 mg/dL 9.27  9.24  9.22   Sodium 135 - 145 mEq/L 141  138  140   Potassium 3.5 - 5.1 mEq/L 4.0  3.9  3.8   Chloride 96 - 112 mEq/L 105  101  104   CO2 19 - 32 mEq/L 32  29  28   Calcium 8.4 - 10.5 mg/dL 9.1  8.9  9.2   Total Protein 6.0 - 8.3 g/dL 7.2   7.2   Total Bilirubin 0.2 - 1.2 mg/dL 0.2   0.2   Alkaline Phos 39 - 117 U/L 87   81   AST 0 - 37 U/L 18   19   ALT 0 - 35 U/L 17   18     Lab Results  Component Value Date   WBC 3.4 (L) 04/26/2024   HGB 12.0 04/26/2024   HCT 36.5 04/26/2024   MCV 80.2 04/26/2024   PLT 268.0 04/26/2024   NEUTROABS 2.3 05/12/2022    ASSESSMENT & PLAN:  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 Biopsy showed DCIS with calcifications, low grade, ER+ 70%, PR+ 90%.  Additional biopsy: Flat epithelial atypia which needs excision Tis NX stage 0   03/12/2020: Left lumpectomy: Flat epithelial atypia radial scar uninvolved by DCIS. Adjuvant radiation therapy completed 06/06/2020   Current treatment: antiestrogen therapy with tamoxifen  5 years started July 2021   Tamoxifen  toxicities: 1.  Hot flashes and mood swings: Currently on Wellbutrin    CT chest March 2022: 3 mm lung  nodule: I discussed with her that it appears to be benign  CT chest 02/04/2022: Stable tiny right upper lobe lung nodules favored benign   Breast cancer surveillance: Mammogram 06/20/2024: Benign, breast density category C CT chest 06/28/2022: unchanged lung nodules 3 mm benign. Hepatic steatosis   RTC in 1 year ------------------------------------- Assessment and Plan Assessment & Plan       No orders of the defined types were placed in this encounter.  The patient has a good understanding of the overall plan. she agrees with it. she will call with any problems that may develop before the next visit here. Total time spent: 30 mins including face to face time and time spent for planning, charting and co-ordination of care   Naomi MARLA Chad, MD 06/28/24

## 2024-06-28 NOTE — Assessment & Plan Note (Signed)
 02/08/2020: Screening mammogram detected left breast calcifications, 2.9cm. Biopsy showed DCIS with calcifications, low grade, ER+ 70%, PR+ 90%.  Additional biopsy: Flat epithelial atypia which needs excision Tis NX stage 0   03/12/2020: Left lumpectomy: Flat epithelial atypia radial scar uninvolved by DCIS. Adjuvant radiation therapy completed 06/06/2020   Current treatment: antiestrogen therapy with tamoxifen  5 years started July 2021   Tamoxifen  toxicities: 1.  Hot flashes and mood swings: Currently on Wellbutrin    CT chest March 2022: 3 mm lung nodule: I discussed with her that it appears to be benign  CT chest 02/04/2022: Stable tiny right upper lobe lung nodules favored benign   Breast cancer surveillance: Mammogram 06/20/2024: Benign, breast density category C CT chest 06/28/2022: unchanged lung nodules 3 mm benign. Hepatic steatosis   RTC in 1 year

## 2024-07-14 ENCOUNTER — Encounter: Payer: Self-pay | Admitting: Family Medicine

## 2024-07-25 ENCOUNTER — Other Ambulatory Visit (HOSPITAL_COMMUNITY): Payer: Self-pay

## 2024-08-18 ENCOUNTER — Other Ambulatory Visit (HOSPITAL_BASED_OUTPATIENT_CLINIC_OR_DEPARTMENT_OTHER): Payer: Self-pay

## 2024-08-18 MED ORDER — FLUZONE 0.5 ML IM SUSY
0.5000 mL | PREFILLED_SYRINGE | Freq: Once | INTRAMUSCULAR | 0 refills | Status: AC
  Filled 2024-08-18: qty 0.5, 1d supply, fill #0

## 2024-09-20 ENCOUNTER — Other Ambulatory Visit (HOSPITAL_COMMUNITY): Payer: Self-pay

## 2024-09-20 ENCOUNTER — Other Ambulatory Visit: Payer: Self-pay

## 2024-09-20 ENCOUNTER — Telehealth: Admitting: Physician Assistant

## 2024-09-20 DIAGNOSIS — R3989 Other symptoms and signs involving the genitourinary system: Secondary | ICD-10-CM | POA: Diagnosis not present

## 2024-09-20 MED ORDER — NITROFURANTOIN MONOHYD MACRO 100 MG PO CAPS
100.0000 mg | ORAL_CAPSULE | Freq: Two times a day (BID) | ORAL | 0 refills | Status: AC
Start: 2024-09-20 — End: ?
  Filled 2024-09-20 (×2): qty 10, 5d supply, fill #0

## 2024-09-20 NOTE — Progress Notes (Signed)

## 2024-09-21 ENCOUNTER — Other Ambulatory Visit: Payer: Self-pay

## 2024-10-16 ENCOUNTER — Other Ambulatory Visit (HOSPITAL_BASED_OUTPATIENT_CLINIC_OR_DEPARTMENT_OTHER): Payer: Self-pay

## 2024-10-16 ENCOUNTER — Other Ambulatory Visit (HOSPITAL_COMMUNITY): Payer: Self-pay

## 2024-10-16 ENCOUNTER — Telehealth: Admitting: Family Medicine

## 2024-10-16 DIAGNOSIS — J069 Acute upper respiratory infection, unspecified: Secondary | ICD-10-CM

## 2024-10-16 MED ORDER — ALBUTEROL SULFATE HFA 108 (90 BASE) MCG/ACT IN AERS
1.0000 | INHALATION_SPRAY | Freq: Four times a day (QID) | RESPIRATORY_TRACT | 0 refills | Status: AC | PRN
  Filled 2024-10-16: qty 6.7, 30d supply, fill #0

## 2024-10-16 MED ORDER — BENZONATATE 200 MG PO CAPS
200.0000 mg | ORAL_CAPSULE | Freq: Three times a day (TID) | ORAL | 0 refills | Status: AC | PRN
  Filled 2024-10-16: qty 30, 10d supply, fill #0

## 2024-10-16 NOTE — Progress Notes (Signed)
 We are sorry that you are not feeling well.  Here is how we plan to help!  Based on your presentation I believe you most likely have A cough due to a virus.  This is called viral bronchitis and is best treated by rest, plenty of fluids and control of the cough.  You may use Ibuprofen  or Tylenol  as directed to help your symptoms.     In addition you may use A prescription cough medication called Tessalon  Perles 100mg . You may take 1-2 capsules every 8 hours as needed for your cough. And an inhaler.    From your responses in the eVisit questionnaire you describe inflammation in the upper respiratory tract which is causing a significant cough.  This is commonly called Bronchitis and has four common causes:   Allergies Viral Infections Acid Reflux Bacterial Infection Allergies, viruses and acid reflux are treated by controlling symptoms or eliminating the cause. An example might be a cough caused by taking certain blood pressure medications. You stop the cough by changing the medication. Another example might be a cough caused by acid reflux. Controlling the reflux helps control the cough.  USE OF BRONCHODILATOR (RESCUE) INHALERS: There is a risk from using your bronchodilator too frequently.  The risk is that over-reliance on a medication which only relaxes the muscles surrounding the breathing tubes can reduce the effectiveness of medications prescribed to reduce swelling and congestion of the tubes themselves.  Although you feel brief relief from the bronchodilator inhaler, your asthma may actually be worsening with the tubes becoming more swollen and filled with mucus.  This can delay other crucial treatments, such as oral steroid medications. If you need to use a bronchodilator inhaler daily, several times per day, you should discuss this with your provider.  There are probably better treatments that could be used to keep your asthma under control.     HOME CARE Only take medications as  instructed by your medical team. Complete the entire course of an antibiotic. Drink plenty of fluids and get plenty of rest. Avoid close contacts especially the very young and the elderly Cover your mouth if you cough or cough into your sleeve. Always remember to wash your hands A steam or ultrasonic humidifier can help congestion.   GET HELP RIGHT AWAY IF: You develop worsening fever. You become short of breath You cough up blood. Your symptoms persist after you have completed your treatment plan MAKE SURE YOU  Understand these instructions. Will watch your condition. Will get help right away if you are not doing well or get worse.  Your e-visit answers were reviewed by a board certified advanced clinical practitioner to complete your personal care plan.  Depending on the condition, your plan could have included both over the counter or prescription medications. If there is a problem please reply  once you have received a response from your provider. Your safety is important to us .  If you have drug allergies check your prescription carefully.    You can use MyChart to ask questions about today's visit, request a non-urgent call back, or ask for a work or school excuse for 24 hours related to this e-Visit. If it has been greater than 24 hours you will need to follow up with your provider, or enter a new e-Visit to address those concerns. You will get an e-mail in the next two days asking about your experience.  I hope that your e-visit has been valuable and will speed your recovery. Thank you  for using e-visits.   I have spent 5 minutes in review of e-visit questionnaire, review and updating patient chart, medical decision making and response to patient.   Chiquita CHRISTELLA Barefoot, NP

## 2024-11-13 ENCOUNTER — Other Ambulatory Visit (HOSPITAL_COMMUNITY): Payer: Self-pay

## 2024-11-13 DIAGNOSIS — I10 Essential (primary) hypertension: Secondary | ICD-10-CM

## 2024-11-14 ENCOUNTER — Other Ambulatory Visit: Payer: Self-pay

## 2024-11-14 ENCOUNTER — Other Ambulatory Visit (HOSPITAL_COMMUNITY): Payer: Self-pay

## 2024-11-14 MED ORDER — BUPROPION HCL ER (XL) 150 MG PO TB24
150.0000 mg | ORAL_TABLET | Freq: Every day | ORAL | 3 refills | Status: AC
  Filled 2024-11-14: qty 90, 90d supply, fill #0

## 2024-11-14 MED ORDER — VERAPAMIL HCL ER 180 MG PO TBCR
360.0000 mg | EXTENDED_RELEASE_TABLET | Freq: Every morning | ORAL | 3 refills | Status: AC
  Filled 2024-11-14: qty 180, 90d supply, fill #0

## 2024-11-15 ENCOUNTER — Other Ambulatory Visit: Payer: Self-pay

## 2024-11-15 ENCOUNTER — Other Ambulatory Visit (HOSPITAL_COMMUNITY): Payer: Self-pay

## 2024-11-15 MED ORDER — TRETINOIN 0.05 % EX CREA
1.0000 "application " | TOPICAL_CREAM | Freq: Every day | CUTANEOUS | 5 refills | Status: AC
  Filled 2024-11-15 (×2): qty 135, 90d supply, fill #0

## 2024-11-16 ENCOUNTER — Other Ambulatory Visit: Payer: Self-pay

## 2024-11-27 ENCOUNTER — Ambulatory Visit: Admitting: Internal Medicine

## 2024-11-27 ENCOUNTER — Encounter: Payer: Self-pay | Admitting: Internal Medicine

## 2024-11-27 VITALS — BP 110/70 | HR 82 | Temp 98.0°F | Ht 63.0 in | Wt 141.2 lb

## 2024-11-27 DIAGNOSIS — I10 Essential (primary) hypertension: Secondary | ICD-10-CM | POA: Diagnosis not present

## 2024-11-27 DIAGNOSIS — T50905A Adverse effect of unspecified drugs, medicaments and biological substances, initial encounter: Secondary | ICD-10-CM | POA: Diagnosis not present

## 2024-11-27 DIAGNOSIS — Z79899 Other long term (current) drug therapy: Secondary | ICD-10-CM

## 2024-11-27 DIAGNOSIS — R42 Dizziness and giddiness: Secondary | ICD-10-CM | POA: Diagnosis not present

## 2024-11-27 DIAGNOSIS — Z008 Encounter for other general examination: Secondary | ICD-10-CM

## 2024-11-27 NOTE — Progress Notes (Signed)
 ==============================  [facility]: [phone]   -- Medical Office Visit --  Patient: Victoria Maddox      Age: 49 y.o.       Sex:  female  Date:   11/27/2024 Today's Healthcare Provider: Bernardino KANDICE Cone, MD  ==============================   Chief Complaint: Dizziness (Follow up on dizziness but states she is feeling better )   Discussed the use of AI scribe software for clinical note transcription with the patient, who gave verbal consent to proceed.  History of Present Illness 49 year old female with hypertension who presents for medical clearance following a dizzy spell.  She experienced dizziness and visual disturbances, described as 'seeing spots,' during an orientation session last Friday. She sat on the floor to recover. 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  She has a history of similar dizziness episodes, particularly when taking two pills of verapamil . She took two pills on the morning of the recent dizzy spell. Her blood pressure at the time was 140/85 mmHg. She is currently taking one pill of verapamil  and hydrochlorothiazide  for blood pressure management. She notes that she experiences dizziness when taking two pills of verapamil  and suspects it may be related to the medication.  She has a history of migraines, which have improved recently. She believes the improvement may be related to her current medication regimen, including metformin. She also experiences constipation, which she associates with verapamil  use.  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 She has three children and is excited about working with babies.  No other symptoms besides dizziness and  constipation.     Background Reviewed: Problem List: has HTN (hypertension); Insomnia; Adjustment disorder with mixed anxiety and depressed mood; Ductal carcinoma in situ (DCIS) of left breast; Family history of breast cancer; Family history of throat cancer; Family history of kidney cancer; Genetic testing; Prediabetes; Shoulder pain, bilateral; Hypertriglyceridemia; GERD (gastroesophageal reflux disease); Upper back pain; Migraine; Cervical radiculopathy; and Constipation on their problem list. Past Medical History:  has a past medical history of Allergy, Depression, Family history of breast cancer, Family history of kidney cancer, Family history of throat cancer, Frequent headaches, Heart murmur, History of recurrent UTIs, Hypertension, Migraines, Personal history of radiation therapy, and Urinary incontinence. Past Surgical History:   has a past surgical history that includes Dilation and curettage of uterus; Breast lumpectomy with radioactive seed localization (Left, 03/12/2020); Breast biopsy (Left, 01/29/2020); Breast biopsy (Left, 02/06/2020); and Breast lumpectomy (Left, 03/12/2020). Social History:   reports that she has never smoked. She has never used smokeless tobacco. She reports that she does not currently use alcohol. She reports that she does not use drugs. Family History:  family history includes Arthritis in her brother; Asthma in her sister; Breast cancer in her cousin; Breast cancer (age of onset: 1) in her cousin; Diabetes in her brother and brother; Early death in her father; Heart attack in her mother; Heart disease in her mother and sister; Hyperlipidemia in her brother; Hypertension in her brother, brother, brother, father, mother, sister, sister, and sister; Kidney cancer (age of onset: 37) in an other family member; Mental illness in her brother; Throat cancer (age of onset: 77) in her sister. Allergies:  is allergic to trazodone  and nefazodone and  zoloft  [sertraline  hcl].    Medication Reconciliation: Current Outpatient Medications on File Prior to Visit  Medication Sig   albuterol  (VENTOLIN  HFA) 108 (90 Base) MCG/ACT inhaler Inhale 1-2 puffs into the lungs every 6 (six) hours as needed for wheezing or shortness of breath (cough).   buPROPion  (WELLBUTRIN  XL) 150 MG 24 hr tablet Take 1 tablet (150 mg total) by mouth daily.   diphenhydrAMINE  (BENADRYL ) 25 mg capsule Take 25 mg by mouth at bedtime as needed.   hydrochlorothiazide  (HYDRODIURIL ) 12.5 MG tablet Take 1 tablet (12.5 mg total) by mouth daily.   lubiprostone  (AMITIZA ) 24 MCG capsule Take 1 capsule (24 mcg total) by mouth 2 (two) times daily with a meal.   pantoprazole  (PROTONIX ) 20 MG tablet Take 1 tablet (20 mg total) by mouth daily for 14 days.   tamoxifen  (NOLVADEX ) 20 MG tablet Take 1 tablet (20 mg) by mouth daily.   tiZANidine  (ZANAFLEX ) 2 MG tablet Take 1-2 tablets (2-4 mg total) by mouth every 8 (eight) hours as needed for muscle spasms (Or for Migraine pain.).   tretinoin  (RETIN-A ) 0.05 % cream Apply daily at bedtime on face up to 8 weeks as tolerated then stop   verapamil  (CALAN -SR) 180 MG CR tablet Take 2 tablets (360 mg total) by mouth every morning.   benzonatate  (TESSALON ) 200 MG capsule Take 1 capsule (200 mg total) by mouth 3 (three) times daily as needed for cough. (Patient not taking: Reported on 11/27/2024)   nitrofurantoin , macrocrystal-monohydrate, (MACROBID ) 100 MG capsule Take 1 capsule (100 mg total) by mouth 2 (two) times daily. (Patient not taking: Reported on 11/27/2024)   No current facility-administered medications on file prior to visit.  There are no discontinued medications.   Physical Exam:    11/27/2024    1:19 PM 06/28/2024    9:14 AM 06/19/2024   10:14 AM  Vitals with BMI  Height 5' 3  5' 3  Weight 141 lbs 3 oz 144 lbs 11 oz 144 lbs 6 oz  BMI 25.02 25.64 25.59  Systolic 110 156 871  Diastolic 70 83 70  Pulse 82 71 69  Vital signs reviewed.  Nursing notes  reviewed. Weight trend reviewed. Physical Activity: Not on file   General Appearance:  No acute distress appreciable.   Well-groomed, healthy-appearing female.  Well proportioned with no abnormal fat distribution.  Good muscle tone. Pulmonary:  Normal work of breathing at rest, no respiratory distress apparent. SpO2: 98 %  Musculoskeletal: All extremities are intact.  Neurological:  Awake, alert, oriented, and engaged.  No obvious focal neurological deficits or cognitive impairments.  Sensorium seems unclouded.   Speech is clear and coherent with logical content. Psychiatric:  Appropriate mood, pleasant and cooperative demeanor, thoughtful and engaged during the exam   Verbalized to patient: Physical Exam    Results:   Verbalized to patient: Results      06/19/2024   10:41 AM 04/26/2024    9:39 AM 06/14/2023   10:30 AM 02/23/2023   11:48 AM  PHQ 2/9 Scores  PHQ - 2 Score 4 2 5  0  PHQ- 9 Score 11  9  16        Data saved with a previous flowsheet row definition    Office Visit on 04/26/2024  Component Date Value Ref Range Status   WBC 04/26/2024 3.4 (L)  4.0 - 10.5 K/uL Final   RBC 04/26/2024 4.55  3.87 - 5.11 Mil/uL Final   Platelets 04/26/2024 268.0  150.0 - 400.0 K/uL Final  Hemoglobin 04/26/2024 12.0  12.0 - 15.0 g/dL Final   HCT 93/81/7974 36.5  36.0 - 46.0 % Final   MCV 04/26/2024 80.2  78.0 - 100.0 fl Final   MCHC 04/26/2024 32.9  30.0 - 36.0 g/dL Final   RDW 93/81/7974 14.5  11.5 - 15.5 % Final   Sodium 04/26/2024 141  135 - 145 mEq/L Final   Potassium 04/26/2024 4.0  3.5 - 5.1 mEq/L Final   Chloride 04/26/2024 105  96 - 112 mEq/L Final   CO2 04/26/2024 32  19 - 32 mEq/L Final   Glucose, Bld 04/26/2024 99  70 - 99 mg/dL Final   BUN 93/81/7974 13  6 - 23 mg/dL Final   Creatinine, Ser 04/26/2024 0.72  0.40 - 1.20 mg/dL Final   Total Bilirubin 04/26/2024 0.2  0.2 - 1.2 mg/dL Final   Alkaline Phosphatase 04/26/2024 87  39 - 117 U/L Final   AST 04/26/2024 18  0 - 37  U/L Final   ALT 04/26/2024 17  0 - 35 U/L Final   Total Protein 04/26/2024 7.2  6.0 - 8.3 g/dL Final   Albumin 93/81/7974 3.9  3.5 - 5.2 g/dL Final   GFR 93/81/7974 99.36  >60.00 mL/min Final   Calcium 04/26/2024 9.1  8.4 - 10.5 mg/dL Final   TSH 93/81/7974 2.62  0.35 - 5.50 uIU/mL Final   Hgb A1c MFr Bld 04/26/2024 6.1  4.6 - 6.5 % Final  Lab on 11/29/2023  Component Date Value Ref Range Status   QuantiFERON-TB Gold Plus 11/29/2023 NEGATIVE  NEGATIVE Final   NIL 11/29/2023 0.03  IU/mL Final   Mitogen-NIL 11/29/2023 >10.00  IU/mL Final   TB1-NIL 11/29/2023 0.00  IU/mL Final   TB2-NIL 11/29/2023 0.00  IU/mL Final  Admission on 07/14/2023, Discharged on 07/14/2023  Component Date Value Ref Range Status   Sodium 07/14/2023 138  135 - 145 mmol/L Final   Potassium 07/14/2023 3.9  3.5 - 5.1 mmol/L Final   Chloride 07/14/2023 101  98 - 111 mmol/L Final   CO2 07/14/2023 29  22 - 32 mmol/L Final   Glucose, Bld 07/14/2023 101 (H)  70 - 99 mg/dL Final   BUN 90/95/7975 13  6 - 20 mg/dL Final   Creatinine, Ser 07/14/2023 0.75  0.44 - 1.00 mg/dL Final   Calcium 90/95/7975 8.9  8.9 - 10.3 mg/dL Final   GFR, Estimated 07/14/2023 >60  >60 mL/min Final   Anion gap 07/14/2023 8  5 - 15 Final   WBC 07/14/2023 3.5 (L)  4.0 - 10.5 K/uL Final   RBC 07/14/2023 4.68  3.87 - 5.11 MIL/uL Final   Hemoglobin 07/14/2023 12.2  12.0 - 15.0 g/dL Final   HCT 90/95/7975 37.4  36.0 - 46.0 % Final   MCV 07/14/2023 79.9 (L)  80.0 - 100.0 fL Final   MCH 07/14/2023 26.1  26.0 - 34.0 pg Final   MCHC 07/14/2023 32.6  30.0 - 36.0 g/dL Final   RDW 90/95/7975 14.1  11.5 - 15.5 % Final   Platelets 07/14/2023 262  150 - 400 K/uL Final   nRBC 07/14/2023 0.0  0.0 - 0.2 % Final   Troponin I (High Sensitivity) 07/14/2023 3  <18 ng/L Final   Preg, Serum 07/14/2023 NEGATIVE  NEGATIVE Final  Office Visit on 06/14/2023  Component Date Value Ref Range Status   WBC 06/14/2023 3.3 (L)  4.0 - 10.5 K/uL Final   RBC 06/14/2023 4.52   3.87 - 5.11 Mil/uL Final   Platelets 06/14/2023 287.0  150.0 - 400.0 K/uL Final   Hemoglobin 06/14/2023 11.9 (L)  12.0 - 15.0 g/dL Final   HCT 91/94/7975 36.7  36.0 - 46.0 % Final   MCV 06/14/2023 81.3  78.0 - 100.0 fl Final   MCHC 06/14/2023 32.3  30.0 - 36.0 g/dL Final   RDW 91/94/7975 14.5  11.5 - 15.5 % Final   Sodium 06/14/2023 140  135 - 145 mEq/L Final   Potassium 06/14/2023 3.8  3.5 - 5.1 mEq/L Final   Chloride 06/14/2023 104  96 - 112 mEq/L Final   CO2 06/14/2023 28  19 - 32 mEq/L Final   Glucose, Bld 06/14/2023 108 (H)  70 - 99 mg/dL Final   BUN 91/94/7975 11  6 - 23 mg/dL Final   Creatinine, Ser 06/14/2023 0.77  0.40 - 1.20 mg/dL Final   Total Bilirubin 06/14/2023 0.2  0.2 - 1.2 mg/dL Final   Alkaline Phosphatase 06/14/2023 81  39 - 117 U/L Final   AST 06/14/2023 19  0 - 37 U/L Final   ALT 06/14/2023 18  0 - 35 U/L Final   Total Protein 06/14/2023 7.2  6.0 - 8.3 g/dL Final   Albumin 91/94/7975 3.9  3.5 - 5.2 g/dL Final   GFR 91/94/7975 92.23  >60.00 mL/min Final   Calcium 06/14/2023 9.2  8.4 - 10.5 mg/dL Final   Hgb J8r MFr Bld 06/14/2023 6.1  4.6 - 6.5 % Final   TSH 06/14/2023 4.23  0.35 - 5.50 uIU/mL Final   Cholesterol 06/14/2023 191  0 - 200 mg/dL Final   Triglycerides 91/94/7975 136.0  0.0 - 149.0 mg/dL Final   HDL 91/94/7975 67.30  >39.00 mg/dL Final   VLDL 91/94/7975 27.2  0.0 - 40.0 mg/dL Final   LDL Cholesterol 06/14/2023 96  0 - 99 mg/dL Final   Total CHOL/HDL Ratio 06/14/2023 3   Final   NonHDL 06/14/2023 123.22   Final   Hepatitis C Ab 06/14/2023 NON-REACTIVE  NON-REACTIVE Final   HIV 1&2 Ab, 4th Generation 06/14/2023 NON-REACTIVE  NON-REACTIVE Final  No image results found. No results found.       ASSESSMENT & PLAN   Assessment & Plan Dizziness Encounter for medical clearance for patient hold Medication management Adverse drug interaction with prescription medication Medical clearance for clinical rotation   She requires medical clearance for a  maternity rotation involving prolonged standing. Dizziness was attributed to a verapamil  overdose. She is cleared for rotation with precautions. A medical clearance letter for the maternity rotation was provided. She is advised to stay hydrated during shifts.  Dizziness secondary to verapamil  (adverse drug effect)   Dizziness resulted from a verapamil  overdose, causing hypotension and potential syncope. Symptoms improved with a reduced dosage. Verapamil  is also used for migraine prevention, but a reduced dosage may increase migraine frequency. The verapamil  dosage is reduced to one tablet daily. She is advised to avoid increasing the dosage during the clinical rotation and instructed to monitor for dizziness, reporting if symptoms persist.  Hypertension   Hypertension is managed with verapamil  and hydrochlorothiazide . The verapamil  dosage was reduced due to adverse effects. Blood pressure was 140/85 during the dizziness episode, indicating a possible need for alternative antihypertensive management. Alternative antihypertensive options will be discussed with her oncologist if needed.  ORDER ASSOCIATIONS  #   DIAGNOSIS / CONDITION ICD-10 ENCOUNTER ORDER     ICD-10-CM   1. Dizziness  R42     2. Encounter for medical clearance for patient hold  Z00.8  3. Medication management  Z79.899     4. Adverse drug interaction with prescription medication  T50.905A            Orders Placed in Encounter:   Medical release given.     This document was synthesized by artificial intelligence ([employer]bridge) using HIPAA-compliant recording of the clinical interaction;   We discussed the use of AI scribe software for clinical note transcription with the patient, who gave verbal consent to proceed. additional Info: This encounter employed state-of-the-art, real-time, collaborative documentation. The patient actively reviewed and assisted in updating their electronic medical record on a shared screen, ensuring  transparency and facilitating joint problem-solving for the problem list, overview, and plan. This approach promotes accurate, informed care. The treatment plan was discussed and reviewed in detail, including medication safety, potential side effects, and all patient questions. We confirmed understanding and comfort with the plan. Follow-up instructions were established, including contacting the office for any concerns, returning if symptoms worsen, persist, or new symptoms develop, and precautions for potential emergency department visits.

## 2024-11-27 NOTE — Patient Instructions (Signed)
 It was a pleasure seeing you today! Your health and satisfaction are our top priorities.  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 We reviewed your medication regimen and its potential side effects, particularly focusing on verapamil . We also addressed your hypertension management and provided a medical clearance letter for your maternity rotation.  YOUR PLAN: -MEDICAL CLEARANCE FOR CLINICAL ROTATION: You are cleared for your maternity rotation, which involves prolonged standing. The dizziness you experienced was attributed to taking an extra dose of verapamil . Please stay hydrated during your shifts and follow the precautions provided.  -DIZZINESS SECONDARY TO VERAPAMIL  (ADVERSE DRUG EFFECT): Your dizziness was caused by taking an extra dose of verapamil , which can lower your blood pressure too much and cause fainting. We have reduced your verapamil  dosage to one tablet daily. Do not increase the dosage during your clinical rotation and monitor for any dizziness. Report if symptoms persist.  -HYPERTENSION: Your high blood pressure is being managed with verapamil  and hydrochlorothiazide . Due to the side effects of verapamil , we have reduced the dosage. Your blood pressure was 140/85 during the dizziness episode, and we may need to discuss alternative medications with your oncologist if necessary.  INSTRUCTIONS: Please monitor your blood pressure regularly and report any persistent dizziness or other symptoms. Stay hydrated during your clinical rotation and avoid increasing your verapamil  dosage. Follow up with your oncologist to discuss alternative antihypertensive options if needed.  Your Providers PCP: Kennyth Worth HERO, MD,  [phone]) Referring Provider: Kennyth Worth HERO, MD,  [phone]) Care Team Provider: Ebbie Cough, MD,  [phone]) Care Team Provider: Odean Potts, MD,  [phone]) Care Team Provider: Dewey Rush, MD,  [phone]) Care Team Provider: Barbette Knock, MD,  [phone]) Care Team Provider: Kennyth Worth HERO, MD,  [phone])  NEXT STEPS: [x]  Early Intervention: Schedule sooner appointment, call our on-call services, or go to emergency room if there is any significant Increase in pain or discomfort New or worsening symptoms Sudden or severe changes in your health [x]  Flexible Follow-Up: We recommend a No follow-ups on file. for optimal routine care. This allows for progress monitoring and treatment adjustments. [x]  Preventive Care: Schedule your annual preventive care visit! It's typically covered by insurance and helps identify potential health issues early. [x]  Lab & X-ray Appointments: Incomplete tests scheduled today, or call to schedule. X-rays: [facility] (M-F, 8:30am-noon or 1pm-5pm). [x]  Medical Information Release: Sign a release form at front desk to obtain relevant medical information we don't have.  MAKING THE MOST OF OUR FOCUSED 20 MINUTE APPOINTMENTS: [x]   Clearly state your top concerns at the beginning of the visit to focus our discussion [x]   If you anticipate you will need more time, please inform the front desk during scheduling - we can book multiple appointments in the same week. [x]   If you have transportation problems- use our convenient video appointments or ask about transportation support. [x]   We can get down to business faster if you use MyChart to update information before the visit and submit non-urgent questions before your visit. Thank you for taking the time to provide details through MyChart.  Let our nurse know and she can import this information into your encounter documents.  Arrival and Wait Times: [x]   Arriving on time ensures that everyone receives prompt attention. [x]   Early morning (8a) and afternoon (1p) appointments tend to have shortest wait times. [x]   Unfortunately, we cannot delay appointments for late arrivals or hold slots during phone calls.  Getting Answers and Following Up [x]   Simple Questions & Concerns: For quick questions or basic follow-up after your visit, reach us  at [phone] or MyChart messaging. [x]   Complex Concerns: If your concern is more complex, scheduling an appointment might be best. Discuss this with the staff to find the most suitable option. [x]   Lab & Imaging Results: We'll contact you directly if results are abnormal or you don't use MyChart. Most normal results will be on MyChart within 2-3 business days, with a review message from Dr. Jesus. Haven't heard back in 2 weeks? Need results sooner? Contact us  at [phone]. [x]   Referrals: Our referral coordinator will manage specialist referrals. The specialist's office should contact you within 2 weeks to schedule an appointment. Call us  if you haven't heard from them after 2 weeks.  Staying Connected [x]   MyChart: Activate your MyChart for the fastest way to access results and message us . See the last page of this paperwork for instructions on how to activate.  Bring to Your Next Appointment [x]   Medications: Please bring all your medication bottles to your next appointment to ensure we have an accurate record of your prescriptions. [x]   Health Diaries: If you're monitoring any health conditions at home, keeping a diary of your readings can be very helpful for discussions at your next appointment.  Billing [x]   X-ray & Lab Orders: These are billed by separate companies. Contact the invoicing company directly for questions or concerns. [x]   Visit Charges: Discuss any billing inquiries with our administrative services team.  Your Satisfaction Matters [x]   Share Your Experience: We strive for your satisfaction! If you have any complaints, or preferably compliments, please let Dr. Jesus know directly or contact our Practice Administrators, Manuelita Rubin  or [employer], by asking at the front desk.   Reviewing Your Records [x]   Review this early draft of your clinical encounter notes below and the final encounter summary tomorrow on MyChart after its been completed.  All orders placed so far are visible here: Dizziness  Encounter for medical clearance for patient hold  Medication management  Adverse drug interaction with prescription medication

## 2025-06-22 ENCOUNTER — Encounter: Admitting: Family Medicine

## 2025-06-28 ENCOUNTER — Ambulatory Visit: Admitting: Hematology and Oncology
